# Patient Record
Sex: Female | Born: 1945
Health system: Southern US, Community
[De-identification: ages and names within clinical notes are randomized; demographics above are authoritative.]

## PROBLEM LIST (undated history)

## (undated) DIAGNOSIS — G4733 Obstructive sleep apnea (adult) (pediatric): Secondary | ICD-10-CM

## (undated) DIAGNOSIS — M48061 Spinal stenosis, lumbar region without neurogenic claudication: Secondary | ICD-10-CM

## (undated) DIAGNOSIS — I10 Essential (primary) hypertension: Secondary | ICD-10-CM

## (undated) DIAGNOSIS — E785 Hyperlipidemia, unspecified: Secondary | ICD-10-CM

## (undated) DIAGNOSIS — M419 Scoliosis, unspecified: Secondary | ICD-10-CM

## (undated) DIAGNOSIS — Q249 Congenital malformation of heart, unspecified: Secondary | ICD-10-CM

## (undated) DIAGNOSIS — K449 Diaphragmatic hernia without obstruction or gangrene: Secondary | ICD-10-CM

## (undated) DIAGNOSIS — E119 Type 2 diabetes mellitus without complications: Secondary | ICD-10-CM

## (undated) DIAGNOSIS — K219 Gastro-esophageal reflux disease without esophagitis: Secondary | ICD-10-CM

## (undated) DIAGNOSIS — I509 Heart failure, unspecified: Secondary | ICD-10-CM

## (undated) DIAGNOSIS — Q8789 Other specified congenital malformation syndromes, not elsewhere classified: Secondary | ICD-10-CM

## (undated) DIAGNOSIS — M4310 Spondylolisthesis, site unspecified: Secondary | ICD-10-CM

## (undated) DIAGNOSIS — J189 Pneumonia, unspecified organism: Secondary | ICD-10-CM

## (undated) HISTORY — DX: Congenital malformation of heart, unspecified: Q24.9

## (undated) HISTORY — DX: Hyperlipidemia, unspecified: E78.5

## (undated) HISTORY — DX: Obstructive sleep apnea (adult) (pediatric): G47.33

## (undated) HISTORY — DX: Type 2 diabetes mellitus without complications: E11.9

## (undated) HISTORY — DX: Scoliosis, unspecified: M41.9

## (undated) HISTORY — DX: Heart failure, unspecified: I50.9

## (undated) HISTORY — DX: Other specified congenital malformation syndromes, not elsewhere classified: Q87.89

## (undated) HISTORY — DX: Gastro-esophageal reflux disease without esophagitis: K21.9

## (undated) HISTORY — DX: Pneumonia, unspecified organism: J18.9

## (undated) HISTORY — DX: Essential (primary) hypertension: I10

## (undated) HISTORY — DX: Spinal stenosis, lumbar region without neurogenic claudication: M48.061

## (undated) HISTORY — DX: Diaphragmatic hernia without obstruction or gangrene: K44.9

## (undated) HISTORY — DX: Spondylolisthesis, site unspecified: M43.10

## (undated) HISTORY — PX: TOTAL HIP ARTHROPLASTY: SHX124

## (undated) HISTORY — PX: CARDIAC SURGERY: SHX584

---

## 2004-05-05 DIAGNOSIS — J189 Pneumonia, unspecified organism: Secondary | ICD-10-CM

## 2004-05-05 HISTORY — DX: Pneumonia, unspecified organism: J18.9

## 2005-02-19 ENCOUNTER — Ambulatory Visit: Payer: Self-pay | Admitting: Family Medicine

## 2005-02-25 ENCOUNTER — Ambulatory Visit: Payer: Self-pay | Admitting: Family Medicine

## 2005-03-31 ENCOUNTER — Ambulatory Visit: Payer: Self-pay | Admitting: Family Medicine

## 2005-05-08 ENCOUNTER — Ambulatory Visit: Payer: Self-pay | Admitting: Family Medicine

## 2005-06-09 ENCOUNTER — Ambulatory Visit: Payer: Self-pay | Admitting: Family Medicine

## 2005-07-28 ENCOUNTER — Ambulatory Visit: Payer: Self-pay | Admitting: Family Medicine

## 2005-12-10 ENCOUNTER — Ambulatory Visit: Payer: Self-pay | Admitting: Family Medicine

## 2006-01-01 ENCOUNTER — Ambulatory Visit: Payer: Self-pay | Admitting: Family Medicine

## 2006-03-19 ENCOUNTER — Ambulatory Visit: Payer: Self-pay | Admitting: Family Medicine

## 2006-03-23 ENCOUNTER — Telehealth (INDEPENDENT_AMBULATORY_CARE_PROVIDER_SITE_OTHER): Payer: Self-pay | Admitting: *Deleted

## 2006-04-02 ENCOUNTER — Ambulatory Visit: Payer: Self-pay | Admitting: Family Medicine

## 2006-04-02 DIAGNOSIS — I10 Essential (primary) hypertension: Secondary | ICD-10-CM | POA: Insufficient documentation

## 2006-04-10 DIAGNOSIS — E785 Hyperlipidemia, unspecified: Secondary | ICD-10-CM | POA: Insufficient documentation

## 2006-06-01 ENCOUNTER — Ambulatory Visit: Payer: Self-pay | Admitting: Family Medicine

## 2006-08-06 ENCOUNTER — Encounter: Payer: Self-pay | Admitting: Family Medicine

## 2006-08-06 LAB — CONVERTED CEMR LAB
AST: 17 units/L (ref 0–37)
BUN: 9 mg/dL (ref 6–23)
Calcium: 9.5 mg/dL (ref 8.4–10.5)
Chloride: 106 meq/L (ref 96–112)
Creatinine, Ser: 0.66 mg/dL (ref 0.40–1.20)
HDL: 47 mg/dL (ref >39)
Total Bilirubin: 0.6 mg/dL (ref 0.3–1.2)
Total CHOL/HDL Ratio: 5
VLDL: 21 mg/dL (ref 0–40)

## 2006-08-12 ENCOUNTER — Encounter: Payer: Self-pay | Admitting: Family Medicine

## 2006-08-14 ENCOUNTER — Encounter: Payer: Self-pay | Admitting: Family Medicine

## 2006-08-14 ENCOUNTER — Ambulatory Visit: Payer: Self-pay | Admitting: Family Medicine

## 2006-08-14 ENCOUNTER — Other Ambulatory Visit: Admission: RE | Admit: 2006-08-14 | Discharge: 2006-08-14 | Payer: Self-pay | Admitting: Family Medicine

## 2006-08-14 DIAGNOSIS — I38 Endocarditis, valve unspecified: Secondary | ICD-10-CM | POA: Insufficient documentation

## 2006-08-17 ENCOUNTER — Encounter: Payer: Self-pay | Admitting: Family Medicine

## 2006-08-18 ENCOUNTER — Encounter: Payer: Self-pay | Admitting: Family Medicine

## 2006-08-21 ENCOUNTER — Telehealth (INDEPENDENT_AMBULATORY_CARE_PROVIDER_SITE_OTHER): Payer: Self-pay | Admitting: *Deleted

## 2006-09-02 ENCOUNTER — Encounter: Payer: Self-pay | Admitting: Family Medicine

## 2006-09-15 ENCOUNTER — Encounter: Payer: Self-pay | Admitting: Family Medicine

## 2006-12-17 ENCOUNTER — Encounter: Admission: RE | Admit: 2006-12-17 | Discharge: 2006-12-17 | Payer: Self-pay | Admitting: Family Medicine

## 2006-12-17 ENCOUNTER — Ambulatory Visit: Payer: Self-pay | Admitting: Family Medicine

## 2006-12-17 DIAGNOSIS — M419 Scoliosis, unspecified: Secondary | ICD-10-CM | POA: Insufficient documentation

## 2006-12-17 DIAGNOSIS — R7301 Impaired fasting glucose: Secondary | ICD-10-CM | POA: Insufficient documentation

## 2006-12-17 DIAGNOSIS — M4180 Other forms of scoliosis, site unspecified: Secondary | ICD-10-CM | POA: Insufficient documentation

## 2006-12-17 DIAGNOSIS — I272 Pulmonary hypertension, unspecified: Secondary | ICD-10-CM | POA: Insufficient documentation

## 2006-12-18 ENCOUNTER — Encounter: Payer: Self-pay | Admitting: Family Medicine

## 2006-12-18 LAB — CONVERTED CEMR LAB
BUN: 11 mg/dL (ref 6–23)
Calcium: 9.1 mg/dL (ref 8.4–10.5)
Direct LDL: 148 mg/dL — ABNORMAL HIGH
Glucose, Bld: 73 mg/dL (ref 70–99)
Sodium: 142 meq/L (ref 135–145)

## 2007-01-19 ENCOUNTER — Encounter: Payer: Self-pay | Admitting: Family Medicine

## 2007-04-19 ENCOUNTER — Ambulatory Visit: Payer: Self-pay | Admitting: Family Medicine

## 2007-04-19 DIAGNOSIS — Z96642 Presence of left artificial hip joint: Secondary | ICD-10-CM | POA: Insufficient documentation

## 2007-04-19 DIAGNOSIS — L659 Nonscarring hair loss, unspecified: Secondary | ICD-10-CM | POA: Insufficient documentation

## 2007-04-20 LAB — CONVERTED CEMR LAB
Ferritin: 55 ng/mL (ref 10–291)
Hemoglobin: 13.2 g/dL (ref 12.0–15.0)
Platelets: 348 10*3/uL (ref 150–400)
RDW: 13.2 % (ref 11.5–15.5)

## 2007-07-19 ENCOUNTER — Ambulatory Visit: Payer: Self-pay | Admitting: Family Medicine

## 2007-07-19 DIAGNOSIS — K219 Gastro-esophageal reflux disease without esophagitis: Secondary | ICD-10-CM | POA: Insufficient documentation

## 2007-07-22 ENCOUNTER — Encounter: Payer: Self-pay | Admitting: Family Medicine

## 2007-07-26 ENCOUNTER — Telehealth: Payer: Self-pay | Admitting: Family Medicine

## 2007-07-30 ENCOUNTER — Encounter: Payer: Self-pay | Admitting: Family Medicine

## 2007-07-30 LAB — CONVERTED CEMR LAB
ALT: 19 units/L (ref 0–35)
Alkaline Phosphatase: 58 units/L (ref 39–117)
LDL Cholesterol: 94 mg/dL (ref 0–99)
Sodium: 143 meq/L (ref 135–145)
Total Bilirubin: 0.5 mg/dL (ref 0.3–1.2)
Total Protein: 7.2 g/dL (ref 6.0–8.3)
VLDL: 17 mg/dL (ref 0–40)

## 2007-08-02 ENCOUNTER — Encounter: Payer: Self-pay | Admitting: Family Medicine

## 2007-08-03 ENCOUNTER — Encounter: Payer: Self-pay | Admitting: Family Medicine

## 2007-08-04 HISTORY — PX: ESOPHAGEAL DILATION: SHX303

## 2007-08-18 ENCOUNTER — Encounter: Payer: Self-pay | Admitting: Family Medicine

## 2007-08-18 DIAGNOSIS — K222 Esophageal obstruction: Secondary | ICD-10-CM | POA: Insufficient documentation

## 2007-12-29 ENCOUNTER — Ambulatory Visit: Payer: Self-pay | Admitting: Family Medicine

## 2007-12-29 ENCOUNTER — Encounter: Admission: RE | Admit: 2007-12-29 | Discharge: 2007-12-29 | Payer: Self-pay | Admitting: Family Medicine

## 2007-12-29 DIAGNOSIS — R609 Edema, unspecified: Secondary | ICD-10-CM | POA: Insufficient documentation

## 2007-12-29 DIAGNOSIS — M5416 Radiculopathy, lumbar region: Secondary | ICD-10-CM | POA: Insufficient documentation

## 2008-01-14 ENCOUNTER — Encounter: Payer: Self-pay | Admitting: Family Medicine

## 2008-01-18 ENCOUNTER — Telehealth: Payer: Self-pay | Admitting: Family Medicine

## 2008-02-11 ENCOUNTER — Encounter: Payer: Self-pay | Admitting: Family Medicine

## 2008-02-15 ENCOUNTER — Telehealth: Payer: Self-pay | Admitting: Family Medicine

## 2008-02-22 ENCOUNTER — Encounter: Payer: Self-pay | Admitting: Family Medicine

## 2008-03-02 ENCOUNTER — Ambulatory Visit: Payer: Self-pay | Admitting: Family Medicine

## 2008-03-02 DIAGNOSIS — Z96652 Presence of left artificial knee joint: Secondary | ICD-10-CM | POA: Insufficient documentation

## 2008-03-02 DIAGNOSIS — I739 Peripheral vascular disease, unspecified: Secondary | ICD-10-CM | POA: Insufficient documentation

## 2008-04-03 ENCOUNTER — Telehealth (INDEPENDENT_AMBULATORY_CARE_PROVIDER_SITE_OTHER): Payer: Self-pay | Admitting: *Deleted

## 2008-04-03 ENCOUNTER — Telehealth: Payer: Self-pay | Admitting: Family Medicine

## 2008-11-20 ENCOUNTER — Telehealth: Payer: Self-pay | Admitting: Family Medicine

## 2008-11-23 ENCOUNTER — Ambulatory Visit: Payer: Self-pay | Admitting: Family Medicine

## 2008-11-23 LAB — CONVERTED CEMR LAB
Bilirubin Urine: NEGATIVE
Glucose, Urine, Semiquant: NEGATIVE
Urobilinogen, UA: 1
pH: 8.5

## 2008-11-24 ENCOUNTER — Encounter: Payer: Self-pay | Admitting: Family Medicine

## 2008-11-24 LAB — CONVERTED CEMR LAB
Alkaline Phosphatase: 74 units/L (ref 39–117)
Glucose, Bld: 100 mg/dL — ABNORMAL HIGH (ref 70–99)
Pro B Natriuretic peptide (BNP): 34.1 pg/mL (ref 0.0–100.0)
Sodium: 144 meq/L (ref 135–145)
Total Bilirubin: 0.5 mg/dL (ref 0.3–1.2)
Total Protein: 6.9 g/dL (ref 6.0–8.3)

## 2009-02-13 ENCOUNTER — Ambulatory Visit: Payer: Self-pay | Admitting: Family Medicine

## 2009-03-14 ENCOUNTER — Encounter (INDEPENDENT_AMBULATORY_CARE_PROVIDER_SITE_OTHER): Payer: Self-pay | Admitting: *Deleted

## 2009-05-08 ENCOUNTER — Ambulatory Visit: Payer: Self-pay | Admitting: Family Medicine

## 2009-05-08 DIAGNOSIS — M479 Spondylosis, unspecified: Secondary | ICD-10-CM | POA: Insufficient documentation

## 2010-03-15 LAB — HM MAMMOGRAPHY

## 2010-04-01 ENCOUNTER — Encounter (INDEPENDENT_AMBULATORY_CARE_PROVIDER_SITE_OTHER): Payer: Self-pay | Admitting: *Deleted

## 2010-05-14 ENCOUNTER — Encounter: Payer: Self-pay | Admitting: Family Medicine

## 2010-06-04 NOTE — Assessment & Plan Note (Signed)
Summary: neck pain   Vital Signs:  Patient profile:   65 year old female Height:      64 inches Weight:      222 pounds BMI:     38.24 O2 Sat:      96 % on Room air Temp:     98.6 degrees F oral Pulse rate:   107 / minute BP sitting:   155 / 77  (left arm) Cuff size:   large  Vitals Entered By: Payton Spark CMA (May 08, 2009 3:14 PM)  O2 Flow:  Room air CC: Neck spasms x 3 days.    Primary Care Provider:  Seymour Bars DO  CC:  Neck spasms x 3 days. Marland Kitchen  History of Present Illness: 65 yo AAF presents for muscle spasm around her neck that started 3 days ago.  She tried some Tylenol and heat which has not helped.  Denies recent trauma.  She is gettting  little HA from this.  She has hx of neck arthritis.  She is having limited ROM.  She is having trouble getting comfortable at night.  Already on Celebrex but this is not helping.  Current Medications (verified): 1)  Pravastatin Sodium 80 Mg  Tabs (Pravastatin Sodium) .Marland Kitchen.. 1 Tab By Mouth Qhs 2)  Bayer Aspirin 325 Mg Tabs (Aspirin) .Marland Kitchen.. 1 Tab By Mouth Daily 3)  Oscal 500/200 D-3 500-200 Mg-Unit Tabs (Calcium-Vitamin D) .Marland Kitchen.. 1 Tab By Mouth Two Times A Day With Food 4)  Multivitamins  Caps (Multiple Vitamin) .Marland Kitchen.. 1 Tab By Mouth Daily 5)  Triamterene-Hctz 37.5-25 Mg Caps (Triamterene-Hctz) .Marland Kitchen.. 1 Tab By Mouth Daily 6)  Celebrex 200 Mg Caps (Celecoxib) .Marland Kitchen.. 1 Capsule By Mouth Daily  Pt Is Medically Prescribed Aspirin 7)  Furosemide 20 Mg Tabs (Furosemide) .Marland Kitchen.. 1 Tab By Mouth Daily As Needed For Leg Swelling 8)  Proair Hfa 108 (90 Base) Mcg/act Aers (Albuterol Sulfate) .... 2 Puffs Q 6 Hrs X 1 Wk  Allergies (verified): 1)  Altace (Ramipril)  Past History:  Past Medical History: Reviewed history from 03/02/2008 and no changes required. congenital heart dz- lg ASD (cards: Dr Julious Oka)  L4-L5 foraminal stenosis mod-severe MVR  servere scoliosis  Severe Pulm HTN-- on nighttime O2  T spine 80% to Right  L spine 55% to Left  pneumonia 2006 dyslipidemia HTN hiatal hernia GERD  Past Surgical History: Reviewed history from 08/18/2007 and no changes required. 2D echo- EF 55-60%, LAE,mod-sev MVR, CXR- mild CHF, patchy bibasilar infiltrates heart surgery- repair of ASD, TEE- EF 60%, MVR- mod dilation of esophageal stricture 4-09, Dr Inge Rise  Social History: Reviewed history from 02/10/2006 and no changes required. Married with 2 adopted children, ages 70 & 1. 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  Nonsmoker.  Denies ETOH.  Does not exercise.  Referred for weight mgmt classes.  Review of Systems      See HPI  Physical Exam  General:  alert, well-developed, well-nourished, and well-hydrated.  obese Head:  normocephalic and atraumatic.   Msk:  tight trapezious muscles bilateral neck. limited global c spine ROM.  grip + 5/5 with full UE ROM. No edema   Impression & Recommendations:  Problem # 1:  ARTHRITIS, CERVICAL SPINE (ICD-721.90) With secondary trapezious spasm. Not improving with Celebrex and heat. Add Valium as a muscle relaxer 1-2 x a day (cautioned about sedation). Use 1/2 of a Percocet at night to help with pain/ sleep for 10 days. Gentle stretches shown to pt.  Use heat to help soften trap muscles. If not improved in 1 wk, will get C spine films and set her up for PT. No current signs of radiculopathy.  Complete Medication List: 1)  Pravastatin Sodium 80 Mg Tabs (Pravastatin sodium) .Marland Kitchen.. 1 tab by mouth qhs 2)  Bayer Aspirin 325 Mg Tabs (Aspirin) .Marland Kitchen.. 1 tab by mouth daily 3)  Oscal 500/200 D-3 500-200 Mg-unit Tabs (Calcium-vitamin d) .Marland Kitchen.. 1 tab by mouth two times a day with food 4)  Multivitamins Caps (Multiple vitamin) .Marland Kitchen.. 1 tab by mouth daily 5)  Triamterene-hctz 37.5-25 Mg Caps (Triamterene-hctz) .Marland Kitchen.. 1 tab by mouth daily 6)  Celebrex 200 Mg Caps (Celecoxib) .Marland Kitchen.. 1 capsule by mouth daily  pt is medically prescribed aspirin 7)  Furosemide 20 Mg Tabs (Furosemide) .Marland Kitchen.. 1  tab by mouth daily as needed for leg swelling 8)  Proair Hfa 108 (90 Base) Mcg/act Aers (Albuterol sulfate) .... 2 puffs q 6 hrs x 1 wk 9)  Valium 5 Mg Tabs (Diazepam) .Marland Kitchen.. 1 tab by mouth two times a day as needed neck spasm 10)  Oxycodone-acetaminophen 5-325 Mg Tabs (Oxycodone-acetaminophen) .... 1/2 tab by mouth at bedtime as needed neck pain  Patient Instructions: 1)  Treat neck arthtrits/ muscle spasm with: 2)  Heat, gentle stretches. 3)  Stay on Celebrex once daily (in the AM). 4)  Take Valium at night (in the AM if you don't have to drive/ work). 5)  Use 1/2 oxycodone at night for neck pain. 6)  If not improved in 1 wk, please call. Prescriptions: OXYCODONE-ACETAMINOPHEN 5-325 MG TABS (OXYCODONE-ACETAMINOPHEN) 1/2 tab by mouth at bedtime as needed neck pain  #10 x 0   Entered and Authorized by:   Seymour Bars DO   Signed by:   Seymour Bars DO on 05/08/2009   Method used:   Printed then faxed to ...       [facility]* (retail)       [address]       [place], [place]  [address]       Ph: [phone]       Fax: [phone]   RxID:   [id number] VALIUM 5 MG TABS (DIAZEPAM) 1 tab by mouth two times a day as needed neck spasm  #14 x 0   Entered and Authorized by:   Seymour Bars DO   Signed by:   Seymour Bars DO on 05/08/2009   Method used:   Printed then faxed to ...       [facility]* (retail)       [address]       [place], [place]  [address]       Ph: [phone]       Fax: [phone]   RxID:   [id number]

## 2010-06-06 NOTE — Consult Note (Signed)
Summary: [facility]   Imported By: Lanelle Bal 05/24/2010 10:35:19  _____________________________________________________________________  External Attachment:    Type:   Image     Comment:   External Document

## 2010-08-07 IMAGING — CR DG KNEE 1-2V*R*
2 series · 2 of 2 positions shown · non-contrast
Comparison: None

CLINICAL DATA: Knee pain without injury.

RIGHT KNEE - 1-2 VIEW

[view not recorded (1 of 2)]
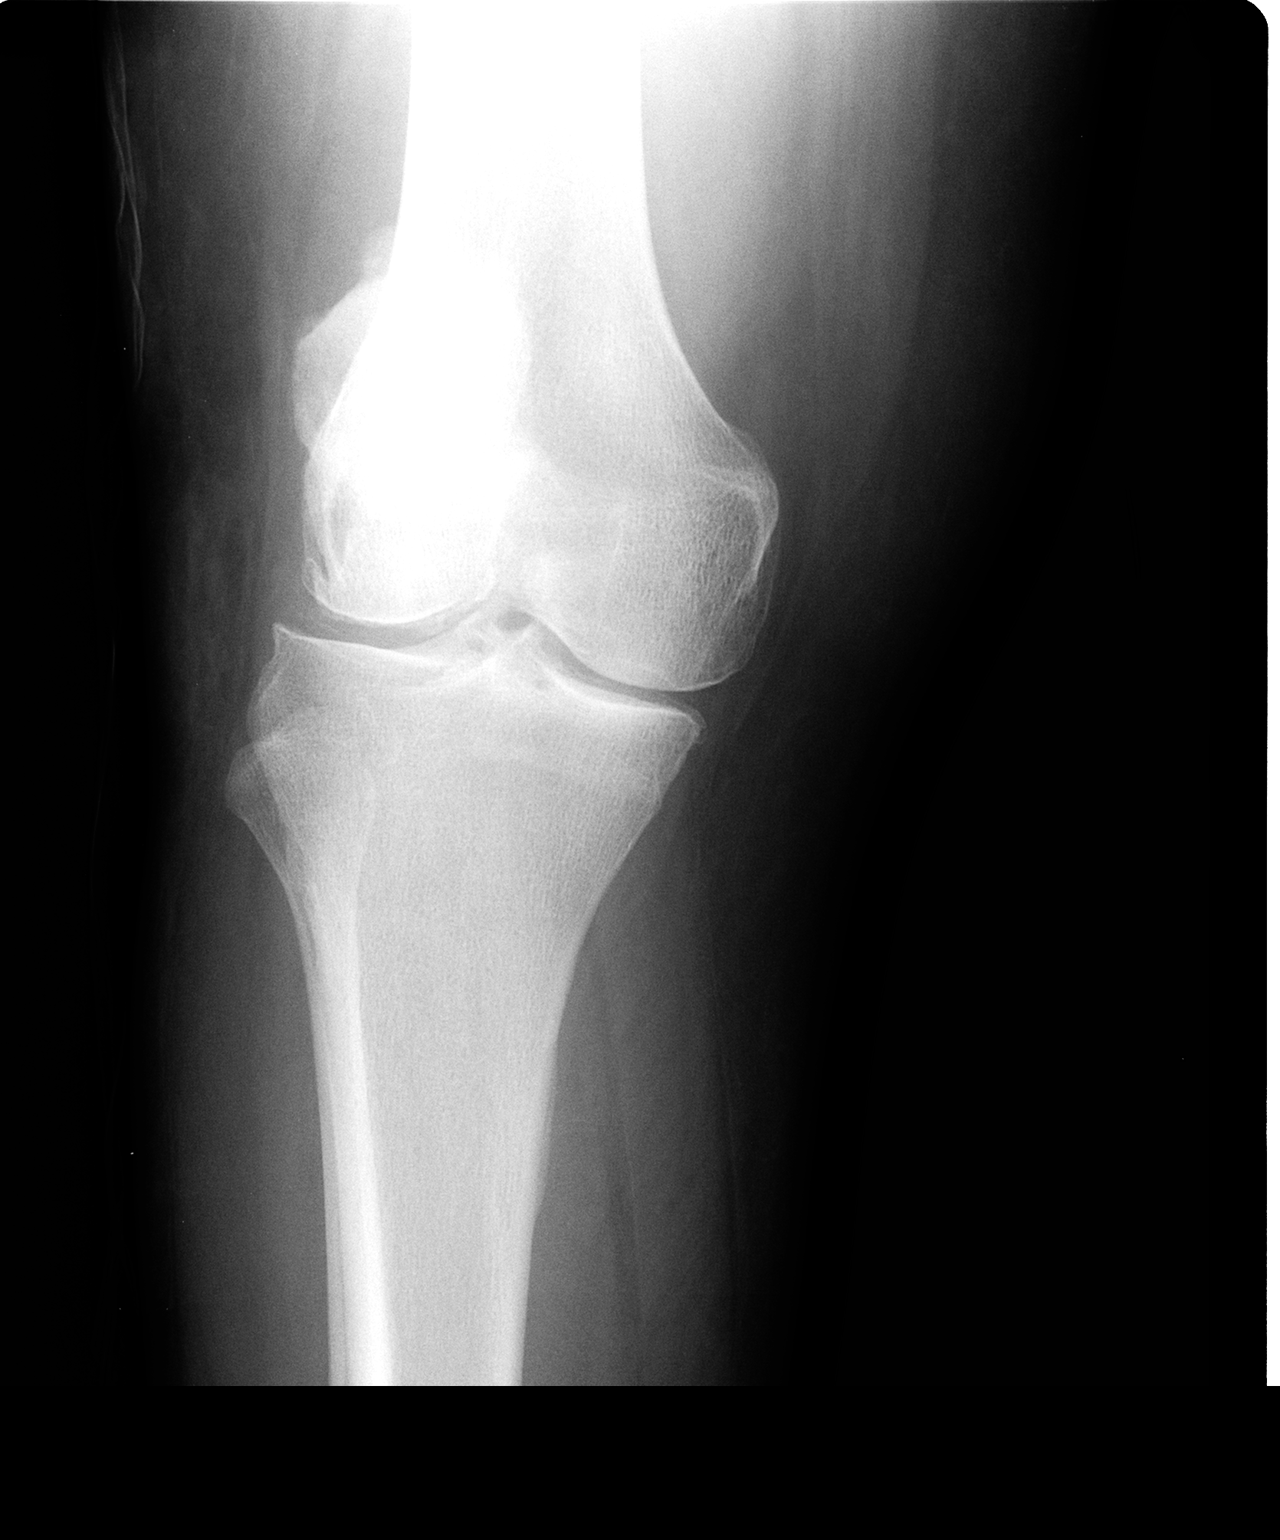

[view not recorded (2 of 2)]
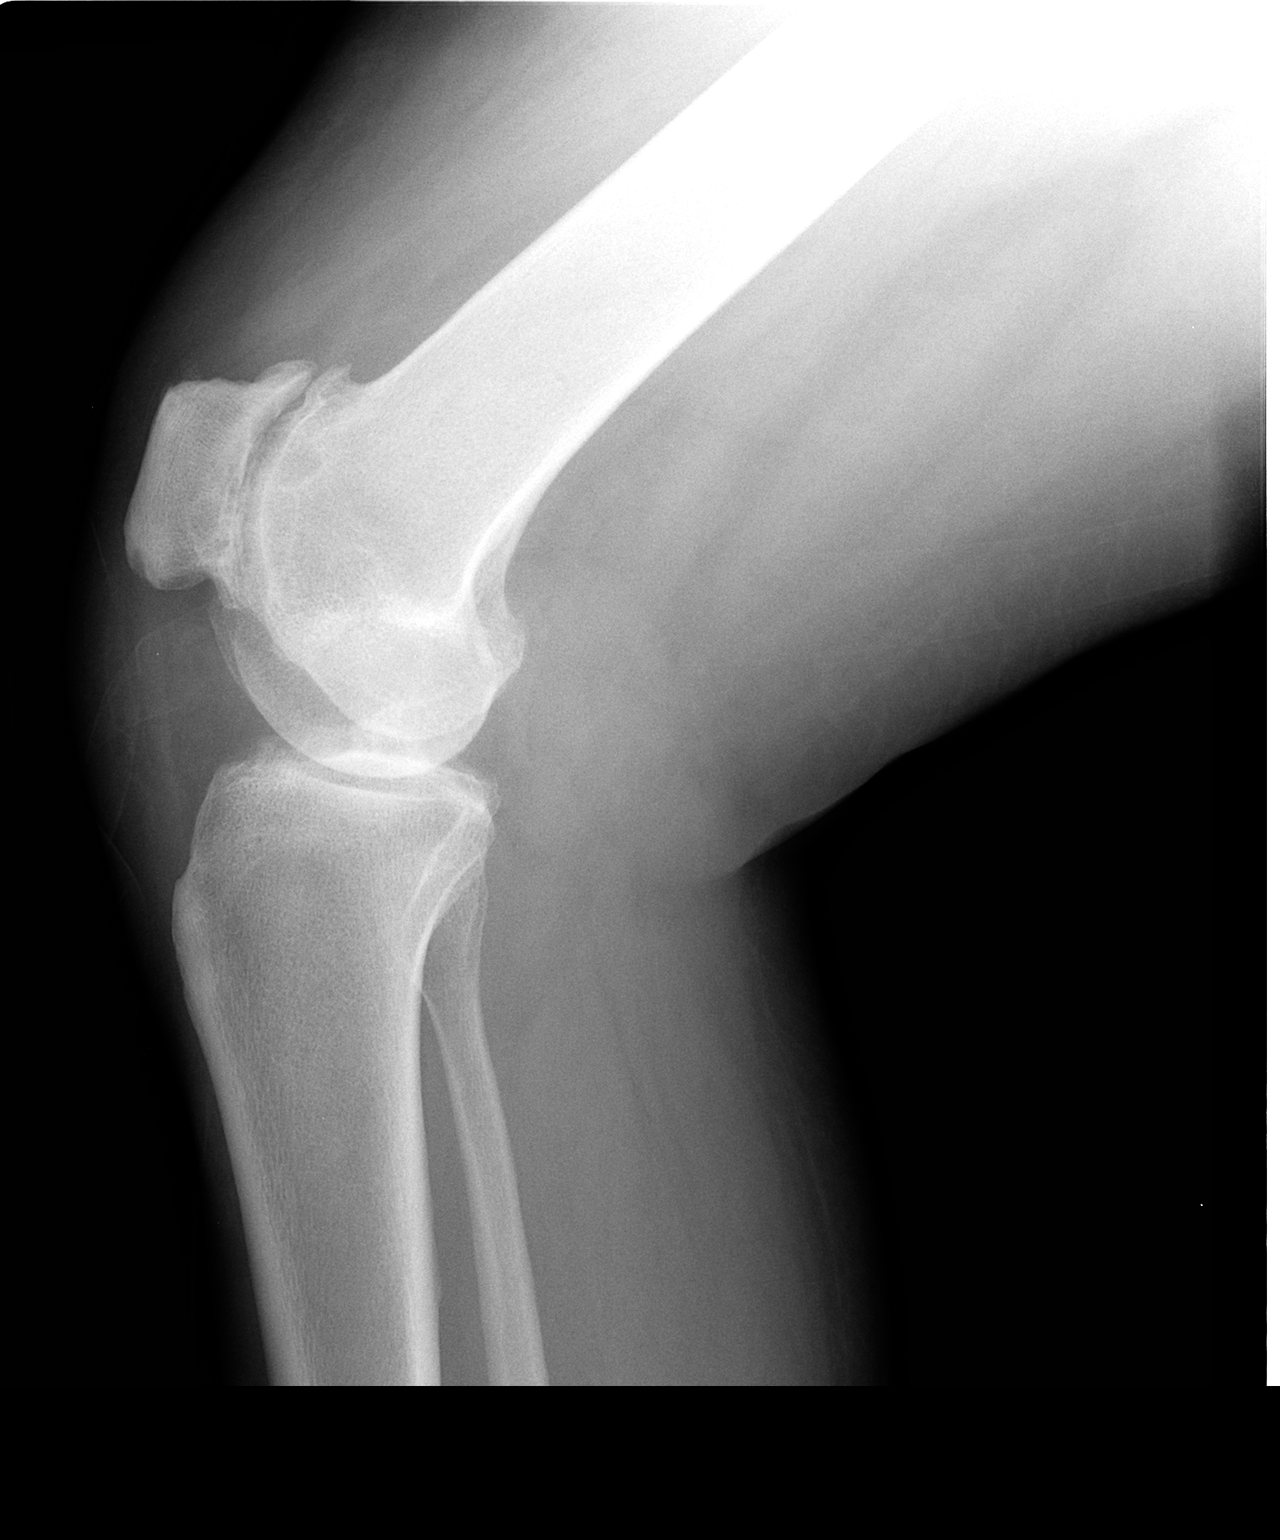

[2 of 2 positions shown; findings below may reference images not displayed]

FINDINGS: Mild to moderate lateral and mild medial compartment
joint space narrowing and osteophyte formation.  Chondrocalcinosis
within the lateral meniscus.  Severe patellofemoral joint
osteoarthritis.  Possible tiny joint effusion.  No acute fracture
dislocation.
IMPRESSION: 1.  Chondrocalcinosis consistent with calcium pyrophosphate
deposition disease.
2.  Concurrent osteoarthritis.
3.  possible small joint effusion.

## 2010-09-09 ENCOUNTER — Other Ambulatory Visit: Payer: Self-pay | Admitting: *Deleted

## 2010-09-09 MED ORDER — FUROSEMIDE 20 MG PO TABS: 20.0000 mg | ORAL_TABLET | Freq: Every day | ORAL | Status: DC

## 2010-10-14 ENCOUNTER — Other Ambulatory Visit: Payer: Self-pay | Admitting: *Deleted

## 2010-10-14 MED ORDER — LORATADINE-PSEUDOEPHEDRINE ER 10-240 MG PO TB24: 1.0000 | ORAL_TABLET | Freq: Every day | ORAL | Status: DC

## 2010-10-27 ENCOUNTER — Encounter: Payer: Self-pay | Admitting: Family Medicine

## 2010-10-29 ENCOUNTER — Encounter: Payer: Self-pay | Admitting: Family Medicine

## 2010-10-29 ENCOUNTER — Ambulatory Visit (INDEPENDENT_AMBULATORY_CARE_PROVIDER_SITE_OTHER): Payer: BC Managed Care – PPO | Admitting: Family Medicine

## 2010-10-29 ENCOUNTER — Other Ambulatory Visit (HOSPITAL_COMMUNITY)
Admission: RE | Admit: 2010-10-29 | Discharge: 2010-10-29 | Disposition: A | Discharge status: Home / Self Care | Payer: BC Managed Care – PPO | Source: Ambulatory Visit | Attending: Family Medicine | Admitting: Family Medicine

## 2010-10-29 ENCOUNTER — Other Ambulatory Visit: Payer: Self-pay | Admitting: Family Medicine

## 2010-10-29 VITALS — BP 132/72 | HR 90 | Ht 64.0 in | Wt 218.0 lb

## 2010-10-29 DIAGNOSIS — Z1211 Encounter for screening for malignant neoplasm of colon: Secondary | ICD-10-CM

## 2010-10-29 DIAGNOSIS — Z23 Encounter for immunization: Secondary | ICD-10-CM

## 2010-10-29 DIAGNOSIS — E785 Hyperlipidemia, unspecified: Secondary | ICD-10-CM

## 2010-10-29 DIAGNOSIS — Z01419 Encounter for gynecological examination (general) (routine) without abnormal findings: Secondary | ICD-10-CM

## 2010-10-29 DIAGNOSIS — I10 Essential (primary) hypertension: Secondary | ICD-10-CM

## 2010-10-29 DIAGNOSIS — Z780 Asymptomatic menopausal state: Secondary | ICD-10-CM

## 2010-10-29 MED ORDER — ALBUTEROL SULFATE HFA 108 (90 BASE) MCG/ACT IN AERS: 2.0000 | INHALATION_SPRAY | Freq: Four times a day (QID) | RESPIRATORY_TRACT | Status: DC | PRN

## 2010-10-29 MED ORDER — ESTRADIOL 0.1 MG/GM VA CREA: 1.0000 g | TOPICAL_CREAM | Freq: Every day | VAGINAL | Status: AC

## 2010-10-29 MED ORDER — TETANUS-DIPHTH-ACELL PERTUSSIS 5-2.5-18.5 LF-MCG/0.5 IM SUSP: 0.5000 mL | Freq: Once | INTRAMUSCULAR | Status: DC

## 2010-10-29 MED ORDER — TRIAMTERENE-HCTZ 37.5-25 MG PO TABS: 1.0000 | ORAL_TABLET | Freq: Every day | ORAL | Status: DC

## 2010-10-29 NOTE — Patient Instructions (Signed)
BP medicine RFd.  Update fasting labs one morning downstairs. Will call you w/ results.  Mammogram due in Nov.  Update bone density downstairs.  Call [phone] and ask for [place] to schedule.  Will get your colonoscopy updated.  Try vaginal estrogen cream 1/2 to 1 gram for the first 2 weeks of each month for vaginal dryness.  Return for f/u visit in 4 mos.

## 2010-10-29 NOTE — Progress Notes (Signed)
  Subjective:    Patient ID: Shelby Thomas, female    DOB: Jan 29, 1946, 65 y.o.   MRN: [id number]  HPI 65 yo AAF presents for CPE with pap.  She is postmenopausal and married.  Denies hx of abnormal pap smear.  She has vaginal irritation and dryness.  She has come off her BP and cholesterol medicine and has a long hx of OSA and valvular heart dz, formerly seen by Dr Julious Oka.  Denies CP but has chronic SOB and gets little exercise.  She has failed to lose wt though her leg swelling has improved.  She is due for her screening colonoscopy, labs and DEXA.  Mammogram due in November.    BP 132/72  Pulse 90  Ht 5\' 4"  (1.626 m)  Wt 218 lb (98.884 kg)  BMI 37.42 kg/m2  SpO2 94% Patient Active Problem List  Diagnoses  . HYPERLIPIDEMIA  . HYPERTENSION, BENIGN ESSENTIAL  . PULMONARY HYPERTENSION, SEVERE  . VALVULAR HEART DISEASE  . PVD  . ESOPHAGEAL STRICTURE  . GERD  . HAIR LOSS  . ARTHRITIS, LEFT HIP  . ARTHRITIS, KNEES, BILATERAL  . ARTHRITIS, CERVICAL SPINE  . BACK PAIN  . SCOLIOSIS NEC  . ANKLE EDEMA  . IMPAIRED FASTING GLUCOSE       Review of Systems Gen: no fevers, chills, hot flashes, night sweats, change in weight GI: no N/V/C/D GU: no dysuria, incontinence or sexual dysfunction CV: no chest pain, DOE, palpitations s or edema Pulm:  Denies CP, SOB or chronic cough      Objective:   Physical Exam Gen: alert, well groomed in NAD, obese Neck: no thyromegaly or cervical lymphadenopathy CV: RRR w/o murmur, no audible carotid bruits or abdominal aortic bruits Ext: no edema, clubbing or cyanosis Lungs: CTA bilat w/o W/R/R; nonlabored HEENT:  [place]/AT; PERRLA; oropharynx pink and moist with good dentition Abd: soft, NT, ND, NABS, No HSM, no audible AA bruits Skin: warm and dry; no rash, pallor or jaundice Breast:  No palpable masses, asymetry or axillary lymphadenopathy, no nipple discharge GU:  Atrophic vaginitis with difficult to visualize os due to atrophy and body  habitus. Slight friability.  No discharge.  Unable to palpate ovaries or adnexa due to body habitus.  Thin prep pap obtained. Psych: does not appear anxious or depressed; answers questions appropriately       Assessment & Plan:  Assesment:  1. CPE- Keeping healthy checklist for women reviewed today.  BP at goal.  BMI 37  in the class II obesity range.     Labs ordered Colonoscopy ordered Mammogram UTD( Nov 2011), DEXA ordered. Encouraged healthy diet, regular exercise, MVI daily. Plan to update a stress echo in the next yr and update her sleep study. Tdap updated today. F/u thin prep and treat atrophic vaginitis with topical estrogen 2 wks/ month.

## 2010-10-30 LAB — LIPID PANEL
LDL Cholesterol: 163 mg/dL — ABNORMAL HIGH (ref 0–99)
VLDL: 21 mg/dL (ref 0–40)

## 2010-10-31 ENCOUNTER — Telehealth: Payer: Self-pay | Admitting: Family Medicine

## 2010-10-31 LAB — COMPLETE METABOLIC PANEL WITH GFR
ALT: 15 U/L (ref 0–35)
AST: 19 U/L (ref 0–37)
Albumin: 3.9 g/dL (ref 3.5–5.2)
CO2: 35 mEq/L — ABNORMAL HIGH (ref 19–32)
Calcium: 9.1 mg/dL (ref 8.4–10.5)
Chloride: 101 mEq/L (ref 96–112)
GFR, Est African American: >60 mL/min (ref >60)
Potassium: 4.4 mEq/L (ref 3.5–5.3)
Sodium: 141 mEq/L (ref 135–145)
Total Protein: 7.2 g/dL (ref 6.0–8.3)

## 2010-10-31 LAB — CBC WITH DIFFERENTIAL/PLATELET
Eosinophils Relative: 4 % (ref 0–5)
Lymphocytes Relative: 29 % (ref 12–46)
Lymphs Abs: 2.4 10*3/uL (ref 0.7–4.0)
MCV: 101.2 fL — ABNORMAL HIGH (ref 78.0–100.0)
Neutro Abs: 5.1 10*3/uL (ref 1.7–7.7)
Neutrophils Relative %: 62 % (ref 43–77)
Platelets: 359 10*3/uL (ref 150–400)
RBC: 4.19 MIL/uL (ref 3.87–5.11)
WBC: 8.3 10*3/uL (ref 4.0–10.5)

## 2010-10-31 NOTE — Telephone Encounter (Signed)
Pls see if the lab can add on an A1C dx: hyperglycemia and a B12 and Folic Acid level dx: macrocytosis.

## 2010-11-01 ENCOUNTER — Telehealth: Payer: Self-pay | Admitting: Family Medicine

## 2010-11-01 LAB — VITAMIN B12: Vitamin B-12: 721 pg/mL (ref 211–911)

## 2010-11-01 LAB — HEMOGLOBIN A1C
Hgb A1c MFr Bld: 6.8 % — ABNORMAL HIGH (ref <5.7)
Mean Plasma Glucose: 148 mg/dL — ABNORMAL HIGH (ref <117)

## 2010-11-01 NOTE — Telephone Encounter (Signed)
Pt aware.

## 2010-11-01 NOTE — Telephone Encounter (Signed)
Labs added.

## 2010-11-01 NOTE — Telephone Encounter (Signed)
Pls let pt know that her pap smear came back normal.  Repeat in 2 yrs. 

## 2010-11-03 ENCOUNTER — Telehealth: Payer: Self-pay | Admitting: Family Medicine

## 2010-11-03 NOTE — Telephone Encounter (Signed)
Pls let pt know that her B12 and Folic Acid came back normal.  Her A1C was 6.8 which is in the DIABETIC RANGE.    I'd like her to set up OV to discuss treatment in the next wk.

## 2010-11-04 NOTE — Telephone Encounter (Signed)
LMOM informing Pt of the above 

## 2010-11-05 ENCOUNTER — Ambulatory Visit
Admission: RE | Admit: 2010-11-05 | Discharge: 2010-11-05 | Disposition: A | Discharge status: Home / Self Care | Payer: BC Managed Care – PPO | Source: Ambulatory Visit | Attending: Family Medicine | Admitting: Family Medicine

## 2010-11-05 ENCOUNTER — Other Ambulatory Visit: Payer: BC Managed Care – PPO

## 2010-11-05 DIAGNOSIS — Z780 Asymptomatic menopausal state: Secondary | ICD-10-CM

## 2010-11-08 ENCOUNTER — Telehealth: Payer: Self-pay | Admitting: Family Medicine

## 2010-11-08 ENCOUNTER — Ambulatory Visit (INDEPENDENT_AMBULATORY_CARE_PROVIDER_SITE_OTHER): Payer: BC Managed Care – PPO | Admitting: Family Medicine

## 2010-11-08 ENCOUNTER — Encounter: Payer: Self-pay | Admitting: Family Medicine

## 2010-11-08 VITALS — BP 129/68 | HR 89 | Wt 215.0 lb

## 2010-11-08 DIAGNOSIS — E669 Obesity, unspecified: Secondary | ICD-10-CM

## 2010-11-08 DIAGNOSIS — E1169 Type 2 diabetes mellitus with other specified complication: Secondary | ICD-10-CM | POA: Insufficient documentation

## 2010-11-08 DIAGNOSIS — E119 Type 2 diabetes mellitus without complications: Secondary | ICD-10-CM | POA: Insufficient documentation

## 2010-11-08 MED ORDER — AMBULATORY NON FORMULARY MEDICATION: Status: DC

## 2010-11-08 MED ORDER — METFORMIN HCL ER (OSM) 500 MG PO TB24: 500.0000 mg | ORAL_TABLET | Freq: Every day | ORAL | Status: DC

## 2010-11-08 MED ORDER — ACCU-CHEK MULTICLIX LANCETS MISC: Status: AC

## 2010-11-08 NOTE — Telephone Encounter (Signed)
LMOM informing Pt  

## 2010-11-08 NOTE — Patient Instructions (Addendum)
Start Metformin XR 500 mg with breakfast daily.  Will set up for nutrition counseling.  Work on low sugar/ low carb diet, regular physical activity.  Check AM fasting sugars.  Goal is 80-110.  Return for f/u sugar readings in 6 wks.

## 2010-11-08 NOTE — Telephone Encounter (Signed)
Pls let pt know that her bone density came back normal.  Repeat in 3 yrs.

## 2010-11-08 NOTE — Assessment & Plan Note (Signed)
Discussed her lab results confirming T2DM.  Will start Metformin XR 500 mg once daily with breakfast.  Set up for nutritionist referral.  Educated pt on self monitoring sugars, AM fasting and a free meter given with RX for test strips and lancets.  Will work on a diabetic diet and more regular exercise.  Long term plan for wt loss is important given her BMI of 36.  Call if any problems.  PNX is UTD.  Plan for urine micro and monofilament at her f/u visit in 6 wks.

## 2010-11-08 NOTE — Progress Notes (Signed)
  Subjective:    Patient ID: Shelby Thomas, female    DOB: 07-23-1945, 65 y.o.   MRN: [id number]  HPI  65 yo AAF presents for a new diagnosis of diabetes.  We did her fasting labs recently and her fasting glucose was 133 on her CMP.  We added an A1C and it was 6.8 confirming her dx.  She has had IFG and obesity problems in the past.  She admits to a lack of exercise and eating candy and ice cream a lot.  She is willing to monitor sugars at home and see a nutritionist.  Denies blurry vision, polyuria, wt loss or paresthesias.  BP 129/68  Pulse 89  Wt 215 lb (97.523 kg)  SpO2 94%    Review of Systems  Constitutional: Negative for fatigue and unexpected weight change.  Eyes: Negative for visual disturbance.  Genitourinary: Negative for frequency.  Neurological: Negative for numbness.       Objective:   Physical Exam  Constitutional: She appears well-developed and well-nourished. No distress.       obese  Psychiatric: She has a normal mood and affect.          Assessment & Plan:

## 2010-11-11 ENCOUNTER — Telehealth: Payer: Self-pay | Admitting: Family Medicine

## 2010-11-11 NOTE — Telephone Encounter (Signed)
Pt called and said Dr. Cathey Endow wanted her to have a colonoscopy and they scheduled her for one, but when she got to checking she realized she had already had a recent one back in April of 2012.  She called where she was suppose to have the colonoscopy and cancelled the appt.   Plan:  Routed to Dr. Arlice Colt, LPN [facility] Dimes

## 2010-12-18 ENCOUNTER — Encounter: Payer: Self-pay | Admitting: Family Medicine

## 2010-12-23 ENCOUNTER — Ambulatory Visit (INDEPENDENT_AMBULATORY_CARE_PROVIDER_SITE_OTHER): Payer: BC Managed Care – PPO | Admitting: Family Medicine

## 2010-12-23 ENCOUNTER — Encounter: Payer: Self-pay | Admitting: Family Medicine

## 2010-12-23 DIAGNOSIS — I10 Essential (primary) hypertension: Secondary | ICD-10-CM

## 2010-12-23 DIAGNOSIS — E119 Type 2 diabetes mellitus without complications: Secondary | ICD-10-CM

## 2010-12-23 DIAGNOSIS — E1169 Type 2 diabetes mellitus with other specified complication: Secondary | ICD-10-CM

## 2010-12-23 DIAGNOSIS — Z23 Encounter for immunization: Secondary | ICD-10-CM

## 2010-12-23 DIAGNOSIS — E669 Obesity, unspecified: Secondary | ICD-10-CM

## 2010-12-23 LAB — POCT UA - MICROALBUMIN

## 2010-12-23 MED ORDER — ZOSTER VACCINE LIVE 19400 UNT/0.65ML ~~LOC~~ SOLR
0.6500 mL | Freq: Once | SUBCUTANEOUS | Status: AC
  Administered 2010-12-23: 19400 [IU] via SUBCUTANEOUS

## 2010-12-23 MED ORDER — LOSARTAN POTASSIUM 100 MG PO TABS: 100.0000 mg | ORAL_TABLET | Freq: Every day | ORAL | Status: DC

## 2010-12-23 NOTE — Progress Notes (Signed)
  Subjective:    Patient ID: Shelby Thomas, female    DOB: 06-Mar-1946, 65 y.o.   MRN: [id number]  HPI 65 yo AAF presents for f/u newly diagnosed T2DM.  Was started on Metformin XR 500 mg / day on 7-6.  She met with the nutritionist and has been taking her blood sugar AM fasting, running 90s to 120.  Working on diet but no exercise and has gained more weight.  Due for a urine microalbumin and a monofilament exam today.  Would like to get her shingles vaccine today.   BP 132/74  Pulse 87  Ht 5\' 3"  (1.6 m)  Wt 214 lb (97.07 kg)  BMI 37.91 kg/m2  Review of Systems  Constitutional: Negative for fatigue and unexpected weight change.  Cardiovascular: Negative for chest pain.  Gastrointestinal: Negative for diarrhea.       Objective:   Physical Exam  Constitutional: She appears well-developed and well-nourished.       obese  HENT:  Mouth/Throat: Oropharynx is clear and moist.  Eyes: No scleral icterus.  Neck: No thyromegaly present.  Cardiovascular: Normal rate and regular rhythm.   Murmur heard. Pulmonary/Chest: Effort normal and breath sounds normal. No respiratory distress.  Musculoskeletal: She exhibits no edema.  Psychiatric: She has a normal mood and affect.          Assessment & Plan:

## 2010-12-23 NOTE — Patient Instructions (Signed)
Stay on metformin XR 500 mg with breakfast.  Keep working on low sugar/ low carb diet and aim for getting 30+ min of exercise each day.    Start Losartan 1/2 tab once daily for BP and kidney protection and STOP Maxzide (fluid pill).  Return for f/u diabetes with A1C in 2 months.

## 2010-12-23 NOTE — Assessment & Plan Note (Addendum)
Last A1C from July was 6.8.  Doing well on Metformin XR 500 mg with breakfast for the past 6 wks.  She met with the nutritionist today.  Monofilament normal.  Urine micro neg.  Held flu shot till next OV.  Shingles vaccine done today.  Continue current treatment plan.  Work on [employer], exercise, wt loss.

## 2010-12-23 NOTE — Assessment & Plan Note (Signed)
BP not at goal and since she now has T2DM, will try her on an ARB.  Had cough from ACEi in the past.  Stop Maxzide and replace with 50 mg of losartan daily (cutting the 100s in half to start).  Recheck a BMP in 6-8 wks.

## 2011-03-03 ENCOUNTER — Ambulatory Visit (INDEPENDENT_AMBULATORY_CARE_PROVIDER_SITE_OTHER): Payer: BC Managed Care – PPO | Admitting: Family Medicine

## 2011-03-03 ENCOUNTER — Encounter: Payer: Self-pay | Admitting: Family Medicine

## 2011-03-03 ENCOUNTER — Ambulatory Visit: Payer: BC Managed Care – PPO | Admitting: Family Medicine

## 2011-03-03 DIAGNOSIS — E119 Type 2 diabetes mellitus without complications: Secondary | ICD-10-CM

## 2011-03-03 LAB — POCT GLYCOSYLATED HEMOGLOBIN (HGB A1C): Hemoglobin A1C: 6.1

## 2011-03-03 NOTE — Patient Instructions (Addendum)
Try to get some regular exercise.   Remember to get your yearly eye exam.

## 2011-03-03 NOTE — Progress Notes (Signed)
  Subjective:    Patient ID: Shelby Thomas, female    DOB: 1945-12-19, 65 y.o.   MRN: [id number]  Diabetes She presents for her follow-up diabetic visit. She has type 2 diabetes mellitus. Her disease course has been stable. There are no hypoglycemic associated symptoms. Pertinent negatives for diabetes include no foot paresthesias, no foot ulcerations, no polydipsia, no polyphagia and no polyuria. Symptoms are stable. Risk factors for coronary artery disease include hypertension. Current diabetic treatment includes oral agent (monotherapy). She is compliant with treatment all of the time. She is following a generally healthy diet. She has not had a previous visit with a dietician. She rarely participates in exercise. There is no change in her home blood glucose trend. An ACE inhibitor/angiotensin II receptor blocker is being taken. She does not see a podiatrist.Eye exam is not current.  She just started metformin about 3 months ago and is doing well. No SE of the med. She is having problems with her glucometer so not checking her sugars.     Review of Systems  Genitourinary: Negative for polyuria.  Hematological: Negative for polydipsia and polyphagia.       Objective:   Physical Exam  Constitutional: She is oriented to person, place, and time. She appears well-developed and well-nourished.  HENT:  Head: Normocephalic and atraumatic.  Cardiovascular: Normal rate, regular rhythm and normal heart sounds.   Pulmonary/Chest: Effort normal and breath sounds normal.  Neurological: She is alert and oriented to person, place, and time.  Skin: Skin is warm and dry.  Psychiatric: She has a normal mood and affect. Her behavior is normal.          Assessment & Plan:  Dm- She would like further diabetic education. Will refer to diabetic and nutrition classed. A2C is 6.1 today. Reminded her ot get her eye exam yearly. Keep current regimen. F/U in 4 months.  Call if any concernes or call if needs  new rx for glucometer.

## 2011-03-17 ENCOUNTER — Other Ambulatory Visit: Payer: Self-pay | Admitting: *Deleted

## 2011-03-17 MED ORDER — METFORMIN HCL ER (OSM) 500 MG PO TB24: 500.0000 mg | ORAL_TABLET | Freq: Every day | ORAL | Status: DC

## 2011-03-18 ENCOUNTER — Telehealth: Payer: Self-pay | Admitting: Family Medicine

## 2011-03-18 NOTE — Telephone Encounter (Signed)
Please call patient. Normal mammogram.  Repeat in 1 year.  

## 2011-03-19 NOTE — Telephone Encounter (Signed)
Left message on vm

## 2011-03-31 ENCOUNTER — Other Ambulatory Visit: Payer: Self-pay | Admitting: *Deleted

## 2011-03-31 MED ORDER — LOSARTAN POTASSIUM 100 MG PO TABS: 100.0000 mg | ORAL_TABLET | Freq: Every day | ORAL | Status: DC

## 2011-06-27 ENCOUNTER — Encounter: Payer: Self-pay | Admitting: *Deleted

## 2011-07-03 ENCOUNTER — Encounter: Payer: Self-pay | Admitting: Family Medicine

## 2011-07-03 ENCOUNTER — Ambulatory Visit (INDEPENDENT_AMBULATORY_CARE_PROVIDER_SITE_OTHER): Payer: BC Managed Care – PPO | Admitting: Family Medicine

## 2011-07-03 DIAGNOSIS — E119 Type 2 diabetes mellitus without complications: Secondary | ICD-10-CM

## 2011-07-03 DIAGNOSIS — E785 Hyperlipidemia, unspecified: Secondary | ICD-10-CM

## 2011-07-03 DIAGNOSIS — E669 Obesity, unspecified: Secondary | ICD-10-CM

## 2011-07-03 DIAGNOSIS — I10 Essential (primary) hypertension: Secondary | ICD-10-CM

## 2011-07-03 MED ORDER — CELECOXIB 200 MG PO CAPS: 200.0000 mg | ORAL_CAPSULE | Freq: Every day | ORAL | Status: DC

## 2011-07-03 NOTE — Progress Notes (Signed)
  Subjective:    Patient ID: Shelby Thomas, female    DOB: 1945-11-19, 66 y.o.   MRN: [id number]  Diabetes She presents for her follow-up diabetic visit. She has type 2 diabetes mellitus. Her disease course has been stable. There are no hypoglycemic associated symptoms. There are no diabetic associated symptoms. Pertinent negatives for diabetes include no blurred vision, no foot paresthesias, no foot ulcerations, no polydipsia, no polyphagia, no polyuria and no visual change. There are no hypoglycemic complications. Symptoms are stable. Risk factors for coronary artery disease include hypertension. She is compliant with treatment all of the time. Her weight is stable. She has not had a previous visit with a dietician. She rarely participates in exercise. There is no change in her home blood glucose trend. Her breakfast blood glucose range is generally 110-130 mg/dl.    HTN- is on losartan daily. She says she takes it regularly. Next is pain or shortness of breath. No regular exercise.  Review of Systems  Eyes: Negative for blurred vision.  Genitourinary: Negative for polyuria.  Hematological: Negative for polydipsia and polyphagia.       Objective:   Physical Exam  Constitutional: She is oriented to person, place, and time. She appears well-developed and well-nourished.  HENT:  Head: Normocephalic and atraumatic.  Cardiovascular: Normal rate, regular rhythm and normal heart sounds.        2/6 SEm best heard the right sternal border.   Pulmonary/Chest: Effort normal and breath sounds normal.  Neurological: She is alert and oriented to person, place, and time.  Skin: Skin is warm and dry.  Psychiatric: She has a normal mood and affect. Her behavior is normal.          Assessment & Plan:  DM- Wel controlled. Continue metformin. F?u in 4 monthe. Due for CMP and lipids. She will go today.   Lab Results  Component Value Date   HGBA1C 6.3 07/03/2011    HTN- Well controlled. F/U in 4  months.

## 2011-07-04 ENCOUNTER — Other Ambulatory Visit: Payer: Self-pay | Admitting: Family Medicine

## 2011-07-04 LAB — COMPLETE METABOLIC PANEL WITH GFR
BUN: 13 mg/dL (ref 6–23)
CO2: 32 mEq/L (ref 19–32)
Calcium: 9.2 mg/dL (ref 8.4–10.5)
Chloride: 104 mEq/L (ref 96–112)
Creat: 0.74 mg/dL (ref 0.50–1.10)
GFR, Est African American: >89 mL/min
GFR, Est Non African American: 86 mL/min
Glucose, Bld: 89 mg/dL (ref 70–99)

## 2011-07-04 LAB — LIPID PANEL
Cholesterol: 216 mg/dL — ABNORMAL HIGH (ref 0–200)
Triglycerides: 81 mg/dL (ref <150)
VLDL: 16 mg/dL (ref 0–40)

## 2011-07-04 MED ORDER — SIMVASTATIN 40 MG PO TABS: 40.0000 mg | ORAL_TABLET | Freq: Every evening | ORAL | Status: DC

## 2011-07-30 ENCOUNTER — Other Ambulatory Visit: Payer: Self-pay | Admitting: *Deleted

## 2011-07-30 MED ORDER — LOSARTAN POTASSIUM 100 MG PO TABS: 100.0000 mg | ORAL_TABLET | Freq: Every day | ORAL | Status: DC

## 2011-08-13 ENCOUNTER — Other Ambulatory Visit: Payer: Self-pay | Admitting: *Deleted

## 2011-08-13 MED ORDER — METFORMIN HCL ER (OSM) 500 MG PO TB24: 500.0000 mg | ORAL_TABLET | Freq: Every day | ORAL | Status: DC

## 2011-09-24 ENCOUNTER — Other Ambulatory Visit: Payer: Self-pay | Admitting: *Deleted

## 2011-09-24 MED ORDER — LOSARTAN POTASSIUM 100 MG PO TABS: 100.0000 mg | ORAL_TABLET | Freq: Every day | ORAL | Status: DC

## 2011-12-15 ENCOUNTER — Other Ambulatory Visit: Payer: Self-pay | Admitting: *Deleted

## 2011-12-15 MED ORDER — METFORMIN HCL ER (OSM) 500 MG PO TB24: 500.0000 mg | ORAL_TABLET | Freq: Every day | ORAL | Status: DC

## 2012-01-21 ENCOUNTER — Other Ambulatory Visit: Payer: Self-pay | Admitting: *Deleted

## 2012-01-21 MED ORDER — LOSARTAN POTASSIUM 100 MG PO TABS: 100.0000 mg | ORAL_TABLET | Freq: Every day | ORAL | Status: DC

## 2012-02-11 ENCOUNTER — Other Ambulatory Visit: Payer: Self-pay | Admitting: *Deleted

## 2012-02-11 MED ORDER — SIMVASTATIN 40 MG PO TABS: 40.0000 mg | ORAL_TABLET | Freq: Every evening | ORAL | Status: DC

## 2012-03-15 ENCOUNTER — Other Ambulatory Visit: Payer: Self-pay | Admitting: *Deleted

## 2012-03-26 ENCOUNTER — Other Ambulatory Visit: Payer: Self-pay | Admitting: *Deleted

## 2012-03-31 ENCOUNTER — Encounter: Payer: Self-pay | Admitting: Family Medicine

## 2012-03-31 ENCOUNTER — Ambulatory Visit (INDEPENDENT_AMBULATORY_CARE_PROVIDER_SITE_OTHER): Payer: BC Managed Care – PPO | Admitting: Family Medicine

## 2012-03-31 VITALS — BP 135/70 | HR 97 | Ht 63.0 in | Wt 212.0 lb

## 2012-03-31 DIAGNOSIS — M179 Osteoarthritis of knee, unspecified: Secondary | ICD-10-CM

## 2012-03-31 DIAGNOSIS — E785 Hyperlipidemia, unspecified: Secondary | ICD-10-CM

## 2012-03-31 DIAGNOSIS — M1710 Unilateral primary osteoarthritis, unspecified knee: Secondary | ICD-10-CM

## 2012-03-31 DIAGNOSIS — E119 Type 2 diabetes mellitus without complications: Secondary | ICD-10-CM

## 2012-03-31 DIAGNOSIS — Z23 Encounter for immunization: Secondary | ICD-10-CM

## 2012-03-31 DIAGNOSIS — IMO0002 Reserved for concepts with insufficient information to code with codable children: Secondary | ICD-10-CM

## 2012-03-31 DIAGNOSIS — I739 Peripheral vascular disease, unspecified: Secondary | ICD-10-CM

## 2012-03-31 DIAGNOSIS — I10 Essential (primary) hypertension: Secondary | ICD-10-CM

## 2012-03-31 LAB — POCT GLYCOSYLATED HEMOGLOBIN (HGB A1C): Hemoglobin A1C: 6.6

## 2012-03-31 LAB — POCT UA - MICROALBUMIN: Microalbumin Ur, POC: 10 mg/dL

## 2012-03-31 MED ORDER — LOSARTAN POTASSIUM 25 MG PO TABS: 25.0000 mg | ORAL_TABLET | Freq: Every day | ORAL | Status: DC

## 2012-03-31 NOTE — Progress Notes (Signed)
  Subjective:    Patient ID: Shelby Thomas, female    DOB: [birth year], 66 y.o.   MRN: [id number]  HPI  DM- well controlled. Home sugars running aroudn 115.  No exercise.  She is doing well with her diet.  No wounds that are not heaing well. Takin gmedications reguarly  HTN- No CP or SOB.  Taking meds.  No swelling.  Hyperlipidemia-she's been taking her statin regularly. She denies any myalgias or side effects. No regular exercise.  Bilateral knee pain and leg pain.  Hx of OA in the knees. She last had x-rays in 2009 and had severe degeneration. At that time she started having steroid injections. After a while she said she "got tired of it". She's never had any other input injection such as Synvisc. She is also not interested in knee replacement at this time.   She also complains of leg pain. She says sometimes it keeps her from doing activities that she would like. She says her whole leg will hurt. Resting does seem to help it. She feels as a separate from the knee pain. I did go back in the old notes and saw Dr. Noel Gerold had ordered a peripheral vascular study in 2009 that did show some mild disease.    Review of Systems     Objective:   Physical Exam  Constitutional: She is oriented to person, place, and time. She appears well-developed and well-nourished.  HENT:  Head: Normocephalic and atraumatic.  Neck: Neck supple. No thyromegaly present.  Cardiovascular: Normal rate, regular rhythm and normal heart sounds.        No carotid bruits  Pulmonary/Chest: Effort normal and breath sounds normal.  Lymphadenopathy:    She has no cervical adenopathy.  Neurological: She is alert and oriented to person, place, and time.  Skin: Skin is warm and dry.  Psychiatric: She has a normal mood and affect. Her behavior is normal.    She is obese.      Assessment & Plan:  Diabetes-well-controlled. Continue current regimen. Followup in 3-4 months. Call if any hypoglycemic  events.  Hypertension-well-controlled. Continue current regimen.  Hyperlipidemia-due to recheck lipid levels as well as liver enzymes. For now continue statin if she's tolerating it well.  Osteoarthritis of the knees-recommend she see my partner Dr. Benjamin Stain for further evaluation. She might be a candidate for stem disc. In all honesty she may really need knee replacement but she says she's not willing to do that at this point. I did not repeat her x-rays, as I expect Dr. Benjamin Stain will likely repeat these when he sees her. She also briefly mentioned left hip pain. I encouraged her to discuss this with him as well.  Peripheral vascular disease-she's currently taking a statin. She also takes a baby aspirin. I would like to repeat the arterial studies since it has been four years to see if she has any progression especially since she's had increasing pain in her legs. I did explain to her that walking is often very common treatment for claudication. Anchors her exercises she is able. Continue statin. Continue aspirin.

## 2012-03-31 NOTE — Patient Instructions (Addendum)
Please schedule with Dr T next week for your knees and your left hip.  I will get you set up for the scan of your legs to re-evaluate you for peripheral vascular disease.

## 2012-04-07 LAB — COMPLETE METABOLIC PANEL WITH GFR
Alkaline Phosphatase: 61 U/L (ref 39–117)
BUN: 11 mg/dL (ref 6–23)
CO2: 34 mEq/L — ABNORMAL HIGH (ref 19–32)
Creat: 0.69 mg/dL (ref 0.50–1.10)
GFR, Est African American: >89 mL/min
GFR, Est Non African American: >89 mL/min
Glucose, Bld: 97 mg/dL (ref 70–99)
Sodium: 143 mEq/L (ref 135–145)
Total Bilirubin: 0.5 mg/dL (ref 0.3–1.2)
Total Protein: 7.1 g/dL (ref 6.0–8.3)

## 2012-04-07 LAB — LIPID PANEL: Total CHOL/HDL Ratio: 3.1 Ratio

## 2012-04-08 NOTE — Progress Notes (Signed)
Quick Note:  All labs are normal. ______ 

## 2012-04-13 ENCOUNTER — Telehealth: Payer: Self-pay | Admitting: Family Medicine

## 2012-04-13 NOTE — Telephone Encounter (Signed)
Pt had diagnosed PVD in 2009 therefore we know ABI will be abnormal. I would suggest to start with just scan and if then abnormal could get with exercise.

## 2012-04-13 NOTE — Telephone Encounter (Signed)
Dr. Linford Arnold  I called [facility] at [phone] and spoke with Supervisor-Missy , trying to schedule patient for [place] LOW EXT ART BIL W/EXERCISE as you had ordered and she informed me that Normally, you would order a ABI first IF that is abnormal, you would do a SCAN & IF that is abnormal you would then do the exercise. Please let me know what needs to be done for this patient. Thanks, [employer]

## 2012-04-15 ENCOUNTER — Other Ambulatory Visit: Payer: Self-pay | Admitting: *Deleted

## 2012-04-15 MED ORDER — METFORMIN HCL ER (OSM) 500 MG PO TB24: 500.0000 mg | ORAL_TABLET | Freq: Every day | ORAL | Status: DC

## 2012-04-20 ENCOUNTER — Encounter: Payer: Self-pay | Admitting: *Deleted

## 2012-07-01 ENCOUNTER — Ambulatory Visit (INDEPENDENT_AMBULATORY_CARE_PROVIDER_SITE_OTHER): Payer: BC Managed Care – PPO | Admitting: Family Medicine

## 2012-07-01 ENCOUNTER — Encounter: Payer: Self-pay | Admitting: Family Medicine

## 2012-07-01 VITALS — BP 128/64 | HR 85 | Ht 65.0 in | Wt 210.0 lb

## 2012-07-01 DIAGNOSIS — E119 Type 2 diabetes mellitus without complications: Secondary | ICD-10-CM

## 2012-07-01 DIAGNOSIS — Z23 Encounter for immunization: Secondary | ICD-10-CM

## 2012-07-01 DIAGNOSIS — E669 Obesity, unspecified: Secondary | ICD-10-CM

## 2012-07-01 DIAGNOSIS — E785 Hyperlipidemia, unspecified: Secondary | ICD-10-CM

## 2012-07-01 DIAGNOSIS — I10 Essential (primary) hypertension: Secondary | ICD-10-CM

## 2012-07-01 LAB — POCT GLYCOSYLATED HEMOGLOBIN (HGB A1C): Hemoglobin A1C: 6.5

## 2012-07-01 MED ORDER — PNEUMOCOCCAL VAC POLYVALENT 25 MCG/0.5ML IJ INJ: 0.5000 mL | INJECTION | INTRAMUSCULAR | Status: DC

## 2012-07-01 MED ORDER — PNEUMOCOCCAL VAC POLYVALENT 25 MCG/0.5ML IJ INJ: 0.5000 mL | INJECTION | Freq: Once | INTRAMUSCULAR | Status: DC

## 2012-07-01 NOTE — Patient Instructions (Signed)
Please schedule with Dr. Benjamin Stain for her neck, knees, and left hip.

## 2012-07-01 NOTE — Progress Notes (Signed)
  Subjective:    Patient ID: Shelby Thomas, female    DOB: December 09, 1945, 67 y.o.   MRN: [id number]  HPI DM- Doing well. No hypoglycemic events.  No wounds, sores that aren't healing well.  No regular exercise. Doing well with her diet.  Has a lot of joint pain that keeps her from exercising. Taking metormin w/o S.E.   HTN-  Pt denies chest pain, SOB, dizziness, or heart palpitations.  Taking meds as directed w/o problems.  Denies medication side effects.  No LE edema.   Hyperlpidemia- toleraing simvastatin well. Takes at bedtime. No myalgias.    Review of Systems     Objective:   Physical Exam  Constitutional: She is oriented to person, place, and time. She appears well-developed and well-nourished.  HENT:  Head: Normocephalic and atraumatic.  Eyes: Conjunctivae are normal. Pupils are equal, round, and reactive to light.  Neck: Neck supple. No thyromegaly present.  Cardiovascular: Normal rate, regular rhythm and normal heart sounds.   Pulmonary/Chest: Effort normal and breath sounds normal.  Musculoskeletal: She exhibits no edema.  Lymphadenopathy:    She has no cervical adenopathy.  Neurological: She is alert and oriented to person, place, and time.  Skin: Skin is warm and dry.  Psychiatric: She has a normal mood and affect. Her behavior is normal.   She did have difficulty getting out of the chair because of her hip.       Assessment & Plan:  DM-Well controlled. Eye exam is uptodate.  Doing well. F/U in 4 months. His statin and ARB, and baby aspirin. Pneumonia vaccine updated today.  HTN -Well ocntrolled.  Continue current regimen.  Labs uptodate. Tdap a low-salt diet and exercise as able.  Hyperlpidemia - Well controlled.  Continue current regimen. Lab Results  Component Value Date   CHOL 154 03/31/2012   HDL 50 03/31/2012   LDLCALC 91 03/31/2012   LDLDIRECT 105* 11/23/2008   TRIG 67 03/31/2012   CHOLHDL 3.1 03/31/2012    Will refer to Dr T for her neck, left hip  and knees.

## 2012-07-23 ENCOUNTER — Ambulatory Visit (INDEPENDENT_AMBULATORY_CARE_PROVIDER_SITE_OTHER): Payer: BC Managed Care – PPO | Admitting: Sports Medicine

## 2012-07-23 ENCOUNTER — Encounter: Payer: Self-pay | Admitting: Sports Medicine

## 2012-07-23 ENCOUNTER — Ambulatory Visit (INDEPENDENT_AMBULATORY_CARE_PROVIDER_SITE_OTHER): Payer: BC Managed Care – PPO

## 2012-07-23 VITALS — BP 136/65 | HR 80 | Wt 210.0 lb

## 2012-07-23 DIAGNOSIS — M25559 Pain in unspecified hip: Secondary | ICD-10-CM

## 2012-07-23 DIAGNOSIS — IMO0002 Reserved for concepts with insufficient information to code with codable children: Secondary | ICD-10-CM

## 2012-07-23 DIAGNOSIS — M129 Arthropathy, unspecified: Secondary | ICD-10-CM

## 2012-07-23 DIAGNOSIS — M169 Osteoarthritis of hip, unspecified: Secondary | ICD-10-CM

## 2012-07-23 DIAGNOSIS — M1710 Unilateral primary osteoarthritis, unspecified knee: Secondary | ICD-10-CM

## 2012-07-23 DIAGNOSIS — M1610 Unilateral primary osteoarthritis, unspecified hip: Secondary | ICD-10-CM

## 2012-07-23 MED ORDER — MELOXICAM 15 MG PO TABS: ORAL_TABLET | ORAL | Status: DC

## 2012-07-23 NOTE — Progress Notes (Signed)
Subjective:    I'm seeing this patient as a consultation for:  Dr. Linford Arnold  CC: Pain  HPI: Bilateral knee pain: Has well-known osteoarthritis of both knees, last injection was years ago. She has been resistant to using any type of oral analgesic. Pain is localized at the joint lines, basic difficult for her to walk and get up, she does develop stiffness in the morning and when seated for long periods of time. The pain is localized and doesn't radiate.  Left hip pain: Localized in the groin, worse with ambulation and weightbearing, pains does not radiate, it is moderate to severe. She's never had injection therapy.  Past medical history, Surgical history, Family history not pertinant except as noted below, Social history, Allergies, and medications have been entered into the medical record, reviewed, and no changes needed.   Review of Systems: No headache, visual changes, nausea, vomiting, diarrhea, constipation, dizziness, abdominal pain, skin rash, fevers, chills, night sweats, weight loss, swollen lymph nodes, body aches, joint swelling, muscle aches, chest pain, shortness of breath, mood changes, visual or auditory hallucinations.   Objective:   General: Well Developed, well nourished, and in no acute distress.  Neuro/Psych: Alert and oriented x3, extra-ocular muscles intact, able to move all 4 extremities, sensation grossly intact. Skin: Warm and dry, no rashes noted.  Respiratory: Not using accessory muscles, speaking in full sentences, trachea midline.  Cardiovascular: Pulses palpable, no extremity edema. Abdomen: Does not appear distended. Bilateral Knee: No effusion visible, tender to palpation over both joint lines. ROM full in flexion and extension and lower leg rotation. Ligaments with solid consistent endpoints including ACL, PCL, LCL, MCL. Negative Mcmurray's, Apley's, and Thessalonian tests. Non painful patellar compression. Patellar glide without crepitus. Patellar and  quadriceps tendons unremarkable. Hamstring and quadriceps strength is normal.  Left Hip: Approximately 5 of internal rotation, weak to all movements.  Procedure: Real-time Ultrasound Guided Injection of left knee Device: GE Logiq E  Ultrasound guided injection is preferred based studies that show increased duration, increased effect, greater accuracy, decreased procedural pain, increased response rate, and decreased cost with ultrasound guided versus blind injection.  Verbal informed consent obtained.  Time-out conducted.  Noted no overlying erythema, induration, or other signs of local infection.  Skin prepped in a sterile fashion.  Local anesthesia: Topical Ethyl chloride.  With sterile technique and under real time ultrasound guidance:  2 cc Kenalog 40, 4 cc lidocaine injected easily into the suprapatellar recess Completed without difficulty  Pain immediately resolved suggesting accurate placement of the medication.  Advised to call if fevers/chills, erythema, induration, drainage, or persistent bleeding.  Images permanently stored and available for review in the ultrasound unit.  Impression: Technically successful ultrasound guided injection.  Procedure: Real-time Ultrasound Guided Injection of right knee Device: GE Logiq E  Ultrasound guided injection is preferred based studies that show increased duration, increased effect, greater accuracy, decreased procedural pain, increased response rate, and decreased cost with ultrasound guided versus blind injection.  Verbal informed consent obtained.  Time-out conducted.  Noted no overlying erythema, induration, or other signs of local infection.  Skin prepped in a sterile fashion.  Local anesthesia: Topical Ethyl chloride.  With sterile technique and under real time ultrasound guidance:  2 cc Kenalog 40, 4 cc lidocaine injected easily into the super patellar recess  Completed without difficulty  Pain immediately resolved suggesting  accurate placement of the medication.  Advised to call if fevers/chills, erythema, induration, drainage, or persistent bleeding.  Images permanently stored and available for  review in the ultrasound unit.  Impression: Technically successful ultrasound guided injection.  Procedure: Real-time Ultrasound Guided Injection of Left hip  Device: GE Logiq E  Ultrasound guided injection is preferred based studies that show increased duration, increased effect, greater accuracy, decreased procedural pain, increased response rate, and decreased cost with ultrasound guided versus blind injection.  Verbal informed consent obtained.  Time-out conducted.  Noted no overlying erythema, induration, or other signs of local infection.  Skin prepped in a sterile fashion.  Local anesthesia: Topical Ethyl chloride.  With sterile technique and under real time ultrasound guidance:  Femoral head/neck junction was distorted. Spinal needle advanced to the femoral head/neck junction, bounced off of the bone, at that point, 2 cc Kenalog 40, 4 cc lidocaine injected into the femoroacetabular joint . Completed without difficulty  Pain immediately resolved suggesting accurate placement of the medication.  Advised to call if fevers/chills, erythema, induration, drainage, or persistent bleeding.  Images permanently stored and available for review in the ultrasound unit.  Impression: Technically successful ultrasound guided injection.  X-rays reviewed, is moderate to severe tricompartmental arthritis in both knees and moderate DJD in her left hip.  Impression and Recommendations:   This case required medical decision making of moderate complexity.

## 2012-07-23 NOTE — Assessment & Plan Note (Signed)
Bilateral guided injections. Mobic, physical therapy.

## 2012-07-23 NOTE — Assessment & Plan Note (Signed)
Intra-articular injection as above. Mobic, physical therapy.

## 2012-07-26 ENCOUNTER — Encounter: Payer: Self-pay | Admitting: Physician Assistant

## 2012-07-26 ENCOUNTER — Ambulatory Visit (INDEPENDENT_AMBULATORY_CARE_PROVIDER_SITE_OTHER): Payer: BC Managed Care – PPO | Admitting: Physician Assistant

## 2012-07-26 VITALS — BP 132/67 | HR 78 | Wt 210.0 lb

## 2012-07-26 DIAGNOSIS — R739 Hyperglycemia, unspecified: Secondary | ICD-10-CM

## 2012-07-26 DIAGNOSIS — R7309 Other abnormal glucose: Secondary | ICD-10-CM

## 2012-07-26 DIAGNOSIS — R112 Nausea with vomiting, unspecified: Secondary | ICD-10-CM

## 2012-07-26 DIAGNOSIS — K529 Noninfective gastroenteritis and colitis, unspecified: Secondary | ICD-10-CM

## 2012-07-26 DIAGNOSIS — R42 Dizziness and giddiness: Secondary | ICD-10-CM

## 2012-07-26 DIAGNOSIS — K5289 Other specified noninfective gastroenteritis and colitis: Secondary | ICD-10-CM

## 2012-07-26 MED ORDER — ONDANSETRON HCL 4 MG PO TABS: 4.0000 mg | ORAL_TABLET | Freq: Three times a day (TID) | ORAL | Status: DC | PRN

## 2012-07-26 MED ORDER — PROMETHAZINE HCL 25 MG/ML IJ SOLN
25.0000 mg | Freq: Once | INTRAMUSCULAR | Status: AC
  Administered 2012-07-26: 25 mg via INTRAMUSCULAR

## 2012-07-26 NOTE — Progress Notes (Signed)
  Subjective:    Patient ID: Shelby Thomas, female    DOB: 03-Jul-1945, 67 y.o.   MRN: [id number]  HPI Patient presents to the clinic with dizziness and nausea and weakness. She got up this morning feeling fine but decided to lay back down. When she got up again noticed she felt dizzy and then nauseated. She took her sugar and was 145. She decided to take metformin and then threw it back up. She denies any fever, chills, sinus pressure, or ST. Denies any diarrhea or problems with bowel movements. Her dizziness seems to have resolved. Eating seems to make worse. Has not tried anything else to make better.      Review of Systems     Objective:   Physical Exam  Constitutional: She is oriented to person, place, and time. She appears well-developed and well-nourished.  Obese.  HENT:  Head: Normocephalic and atraumatic.  Right Ear: External ear normal.  Left Ear: External ear normal.  Nose: Nose normal.  Mouth/Throat: Oropharynx is clear and moist.  Eyes: Conjunctivae are normal.  Neck: Normal range of motion. Neck supple.  Cardiovascular: Normal rate, regular rhythm and normal heart sounds.   Pulmonary/Chest: Effort normal and breath sounds normal. She has no wheezes.  Abdominal: Soft. Bowel sounds are normal. She exhibits no distension. There is no tenderness. There is no guarding.  Lymphadenopathy:    She has no cervical adenopathy.  Neurological: She is alert and oriented to person, place, and time.  Skin: Skin is warm and dry.  Psychiatric: She has a normal mood and affect. Her behavior is normal.          Assessment & Plan:  Nausea/dizziness/Gastroenteritis- Glucose was 155. Reassured pt that I did not think her symptoms had anything to do with her sugar. Did explain that in times of illnes sugars tend to be out of control. I would not consider her to be out of control. Recommended that she start checking sugar every morning fasting and keeping a low. Goal 120 if running above  come to see dr. Linford Arnold to test A1C. In the meantime I think you have some gastroenteritis. Discussed BRAT diet and staying hydrated. Will give shot of phenergan and oral to take at home. REST. Call if not improving or worsening.

## 2012-07-26 NOTE — Patient Instructions (Addendum)
I want you to keep a closer look sugar for the next week check daily fasting. Keep a log. Goal is under 120.  Will give anti-nausea in office.   B.R.A.T. Diet Your doctor has recommended the B.R.A.T. diet for you or your child until the condition improves. This is often used to help control diarrhea and vomiting symptoms. If you or your child can tolerate clear liquids, you may have:  Bananas.   Rice.   Applesauce.   Toast (and other simple starches such as crackers, potatoes, noodles).  Be sure to avoid dairy products, meats, and fatty foods until symptoms are better. Fruit juices such as apple, grape, and prune juice can make diarrhea worse. Avoid these. Continue this diet for 2 days or as instructed by your caregiver. Document Released: 04/21/2005 Document Revised: 04/10/2011 Document Reviewed: 10/08/2006 [facility] Patient Information 2012 [place], [place].  Viral Gastroenteritis Viral gastroenteritis is also known as stomach flu. This condition affects the stomach and intestinal tract. It can cause sudden diarrhea and vomiting. The illness typically lasts 3 to 8 days. Most people develop an immune response that eventually gets rid of the virus. While this natural response develops, the virus can make you quite ill. CAUSES  Many different viruses can cause gastroenteritis, such as rotavirus or noroviruses. You can catch one of these viruses by consuming contaminated food or water. You may also catch a virus by sharing utensils or other personal items with an infected person or by touching a contaminated surface. SYMPTOMS  The most common symptoms are diarrhea and vomiting. These problems can cause a severe loss of body fluids (dehydration) and a body salt (electrolyte) imbalance. Other symptoms may include:  Fever.  Headache.  Fatigue.  Abdominal pain. DIAGNOSIS  Your caregiver can usually diagnose viral gastroenteritis based on your symptoms and a physical exam. A stool sample may  also be taken to test for the presence of viruses or other infections. TREATMENT  This illness typically goes away on its own. Treatments are aimed at rehydration. The most serious cases of viral gastroenteritis involve vomiting so severely that you are not able to keep fluids down. In these cases, fluids must be given through an intravenous line (IV). HOME CARE INSTRUCTIONS   Drink enough fluids to keep your urine clear or pale yellow. Drink small amounts of fluids frequently and increase the amounts as tolerated.  Ask your caregiver for specific rehydration instructions.  Avoid:  Foods high in sugar.  Alcohol.  Carbonated drinks.  Tobacco.  Juice.  Caffeine drinks.  Extremely hot or cold fluids.  Fatty, greasy foods.  Too much intake of anything at one time.  Dairy products until 24 to 48 hours after diarrhea stops.  You may consume probiotics. Probiotics are active cultures of beneficial bacteria. They may lessen the amount and number of diarrheal stools in adults. Probiotics can be found in yogurt with active cultures and in supplements.  Wash your hands well to avoid spreading the virus.  Only take over-the-counter or prescription medicines for pain, discomfort, or fever as directed by your caregiver. Do not give aspirin to children. Antidiarrheal medicines are not recommended.  Ask your caregiver if you should continue to take your regular prescribed and over-the-counter medicines.  Keep all follow-up appointments as directed by your caregiver. SEEK IMMEDIATE MEDICAL CARE IF:   You are unable to keep fluids down.  You do not urinate at least once every 6 to 8 hours.  You develop shortness of breath.  You notice  blood in your stool or vomit. This may look like coffee grounds.  You have abdominal pain that increases or is concentrated in one small area (localized).  You have persistent vomiting or diarrhea.  You have a fever.  The patient is a child younger  than 3 months, and he or she has a fever.  The patient is a child older than 3 months, and he or she has a fever and persistent symptoms.  The patient is a child older than 3 months, and he or she has a fever and symptoms suddenly get worse.  The patient is a baby, and he or she has no tears when crying. MAKE SURE YOU:   Understand these instructions.  Will watch your condition.  Will get help right away if you are not doing well or get worse. Document Released: 04/21/2005 Document Revised: 07/14/2011 Document Reviewed: 02/05/2011 [facility] Patient Information 2013 [place], [place].

## 2012-08-16 ENCOUNTER — Other Ambulatory Visit: Payer: Self-pay | Admitting: *Deleted

## 2012-08-16 MED ORDER — METFORMIN HCL ER (OSM) 500 MG PO TB24: 500.0000 mg | ORAL_TABLET | Freq: Every day | ORAL | Status: DC

## 2012-08-23 ENCOUNTER — Ambulatory Visit: Payer: BC Managed Care – PPO | Admitting: Sports Medicine

## 2012-08-24 ENCOUNTER — Ambulatory Visit (INDEPENDENT_AMBULATORY_CARE_PROVIDER_SITE_OTHER): Payer: BC Managed Care – PPO | Admitting: Sports Medicine

## 2012-08-24 ENCOUNTER — Encounter: Payer: Self-pay | Admitting: Sports Medicine

## 2012-08-24 VITALS — BP 125/70 | HR 77 | Wt 203.0 lb

## 2012-08-24 DIAGNOSIS — M1610 Unilateral primary osteoarthritis, unspecified hip: Secondary | ICD-10-CM

## 2012-08-24 DIAGNOSIS — M129 Arthropathy, unspecified: Secondary | ICD-10-CM

## 2012-08-24 NOTE — Assessment & Plan Note (Signed)
Pain has dropped to 1/10 from a 10 out of 10 after injection to both knees. Follow up in 3 months for this.

## 2012-08-24 NOTE — Progress Notes (Signed)
   Subjective:    CC:  Followup  HPI: Left hip osteoarthritis: Pain has decreased from a 10 out of 10 to a 2/10 after intra-articular injection. She continues to work with physical therapy and has a few more weeks left.  Bilateral knee osteoarthritis: Pain is decreased from a 10 out of 10 to a 1/10 after bilateral intra-articular injections as well as formal physical therapy.  Past medical history, Surgical history, Family history not pertinant except as noted below, Social history, Allergies, and medications have been entered into the medical record, reviewed, and no changes needed.   Review of Systems: No headache, visual changes, nausea, vomiting, diarrhea, constipation, dizziness, abdominal pain, skin rash, fevers, chills, night sweats, weight loss, swollen lymph nodes, body aches, joint swelling, muscle aches, chest pain, shortness of breath, mood changes, visual or auditory hallucinations.   Objective:   General: Well Developed, well nourished, and in no acute distress.  Neuro/Psych: Alert and oriented x3, extra-ocular muscles intact, able to move all 4 extremities, sensation grossly intact. Skin: Warm and dry, no rashes noted.  Respiratory: Not using accessory muscles, speaking in full sentences, trachea midline.  Cardiovascular: Pulses palpable, no extremity edema. Abdomen: Does not appear distended. Impression and Recommendations:   This case required medical decision making of moderate complexity.

## 2012-08-24 NOTE — Assessment & Plan Note (Signed)
Still doing physical therapy and continuing to make progress. Pain is decreased down to a 2/10 from 10 out of 10. Return in 3 months.

## 2012-09-15 ENCOUNTER — Other Ambulatory Visit: Payer: Self-pay | Admitting: *Deleted

## 2012-09-15 MED ORDER — METFORMIN HCL ER (OSM) 500 MG PO TB24: 500.0000 mg | ORAL_TABLET | Freq: Every day | ORAL | Status: DC

## 2012-10-29 ENCOUNTER — Telehealth: Payer: Self-pay | Admitting: Family Medicine

## 2012-10-29 ENCOUNTER — Ambulatory Visit (INDEPENDENT_AMBULATORY_CARE_PROVIDER_SITE_OTHER): Payer: BC Managed Care – PPO | Admitting: Family Medicine

## 2012-10-29 ENCOUNTER — Encounter: Payer: Self-pay | Admitting: Family Medicine

## 2012-10-29 VITALS — BP 135/74 | HR 85 | Ht 63.6 in | Wt 203.0 lb

## 2012-10-29 DIAGNOSIS — E1169 Type 2 diabetes mellitus with other specified complication: Secondary | ICD-10-CM

## 2012-10-29 DIAGNOSIS — E119 Type 2 diabetes mellitus without complications: Secondary | ICD-10-CM

## 2012-10-29 DIAGNOSIS — E669 Obesity, unspecified: Secondary | ICD-10-CM

## 2012-10-29 DIAGNOSIS — M7701 Medial epicondylitis, right elbow: Secondary | ICD-10-CM

## 2012-10-29 DIAGNOSIS — M1610 Unilateral primary osteoarthritis, unspecified hip: Secondary | ICD-10-CM

## 2012-10-29 DIAGNOSIS — M25539 Pain in unspecified wrist: Secondary | ICD-10-CM

## 2012-10-29 LAB — LIPID PANEL
HDL: 61 mg/dL (ref >39)
LDL Cholesterol: 103 mg/dL — ABNORMAL HIGH (ref 0–99)
Triglycerides: 77 mg/dL (ref <150)
VLDL: 15 mg/dL (ref 0–40)

## 2012-10-29 LAB — COMPLETE METABOLIC PANEL WITH GFR
ALT: 19 U/L (ref 0–35)
AST: 20 U/L (ref 0–37)
Calcium: 9.5 mg/dL (ref 8.4–10.5)
Chloride: 105 mEq/L (ref 96–112)
Creat: 0.65 mg/dL (ref 0.50–1.10)
Sodium: 145 mEq/L (ref 135–145)
Total Bilirubin: 0.5 mg/dL (ref 0.3–1.2)
Total Protein: 6.9 g/dL (ref 6.0–8.3)

## 2012-10-29 LAB — POCT GLYCOSYLATED HEMOGLOBIN (HGB A1C): Hemoglobin A1C: 6.4

## 2012-10-29 MED ORDER — METFORMIN HCL ER (OSM) 500 MG PO TB24: 500.0000 mg | ORAL_TABLET | Freq: Every day | ORAL | Status: DC

## 2012-10-29 MED ORDER — MELOXICAM 15 MG PO TABS: ORAL_TABLET | ORAL | Status: DC

## 2012-10-29 NOTE — Progress Notes (Signed)
Subjective:    Patient ID: Shelby Thomas, female    DOB: 01/31/1946, 67 y.o.   MRN: [id number]  HPI DM- doing well overall.  No wounds that aren't healing well. Feels she is eating well. Getting somerexercise.  Takes a baby ASA.    Right elbow pain x 1 months. Hard to reach up. Feels sore and stiff.  Painful with movement. Radiates up an down her arm.  She is left handed.  Has been using some OTC pain reliever.  No swelling. No heat or ice.  Has been using a menthol cream.    Review of Systems     BP 135/74  Pulse 85  Ht 5' 3.6" (1.615 m)  Wt 203 lb (92.08 kg)  BMI 35.3 kg/m2    Allergies  Allergen Reactions  . Ramipril     REACTION: cough    Past Medical History  Diagnosis Date  . Heart disease, congenital     large ASD, Sees Dr. Elmarie Shiley  . Foraminal stenosis of lumbar region     L4--L5  . 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 Scoliosis     severe  . Hypertension     pulmonary/on nighttime O2  . Pneumonia 2006  . Hyperlipidemia     dyslipidemia  . Hiatal hernia   . GERD (gastroesophageal reflux disease)   . CHF (congestive heart failure)     mild  . Spine misalignment     T spine 80% to right, L-Spine 55% to left    Past Surgical History  Procedure Laterality Date  . Cardiac surgery      repair of ASD  . Esophageal dilation  04-09    dilation/Dr. Inge Rise    History   Social History  . Marital Status: Married    Spouse Name: N/A    Number of Children: N/A  . Years of Education: N/A   Occupational History  . Not on file.   Social History Main Topics  . Smoking status: Never Smoker   . Smokeless tobacco: Not on file  . Alcohol Use: No  . Drug Use:   . 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    Other Topics Concern  . Not on file   Social History Narrative  . No narrative on file    Family History  Problem Relation Age of Onset  .  Diabetes Mother   . Stroke Mother     Outpatient Encounter Prescriptions as of 10/29/2012  Medication Sig Dispense Refill  . AMBULATORY NON FORMULARY MEDICATION Medication Name: Free Style Insu[employer]x test strips.  Use daily as directed  50 strip  12  . aspirin 81 MG tablet Take 81 mg by mouth daily.        . Calcium Carbonate-Vitamin D (CALCIUM-VITAMIN D) 500-200 MG-UNIT per tablet Take 1 tablet by mouth 2 (two) times daily with a meal.        . losartan (COZAAR) 25 MG tablet Take 1 tablet (25 mg total) by mouth daily.  90 tablet  2  . meloxicam (MOBIC) 15 MG tablet One tab PO qAM with breakfast for 2 weeks, then daily prn pain.  30 tablet  3  . metformin (FORTAMET) 500 MG (OSM) 24 hr tablet Take 1 tablet (500 mg total) by mouth daily with breakfast.  30 tablet  6  . Multiple Vitamin (MULTIVITAMIN) capsule Take 1  capsule by mouth daily.        Marland Kitchen omeprazole (PRILOSEC) 20 MG capsule       . simvastatin (ZOCOR) 40 MG tablet Take 1 tablet (40 mg total) by mouth every evening.  30 tablet  6  . [DISCONTINUED] meloxicam (MOBIC) 15 MG tablet One tab PO qAM with breakfast for 2 weeks, then daily prn pain.  30 tablet  3  . [DISCONTINUED] metformin (FORTAMET) 500 MG (OSM) 24 hr tablet Take 1 tablet (500 mg total) by mouth daily with breakfast.  30 tablet  0  . [DISCONTINUED] ondansetron (ZOFRAN) 4 MG tablet Take 1 tablet (4 mg total) by mouth every 8 (eight) hours as needed for nausea.  20 tablet  0   No facility-administered encounter medications on file as of 10/29/2012.       Objective:   Physical Exam  Constitutional: She is oriented to person, place, and time. She appears well-developed and well-nourished.  HENT:  Head: Normocephalic and atraumatic.  Cardiovascular: Normal rate, regular rhythm and normal heart sounds.   Pulmonary/Chest: Effort normal and breath sounds normal.  Musculoskeletal:  Right elbow in mildly tender alont the medial epicondyle but not sig tender.  Pain in that area with  supination of wrist.  Painful in that area when reaches above her head.  Shoulder with NROM. Wrist and fingers with NROM. No weakness in the fingers.   Neurological: She is alert and oriented to person, place, and time.  Skin: Skin is warm and dry.  Psychiatric: She has a normal mood and affect. Her behavior is normal.          Assessment & Plan:  DM- on ARB, statin and ASA.  WEll controlle.  Lab Results  Component Value Date   HGBA1C 6.4 10/29/2012   Right medial epicondylitis - I believe her right elbow pain is most consistent with right medial epicondylitis. Also consider that she could have some type of nerve entrapment in the elbow that she's not having any numbness or tingling into the hand. She's not super tender right over the epicondyles but this really is the main area of her discomfort. Will treat with Voltaren gel and exercises. Handout given. If she's not improving over the next 3 weeks I encouraged her call back. At that point I would like to get her in with my partner Dr. Rodney Langton for further evaluation and treatment.

## 2012-10-29 NOTE — Telephone Encounter (Signed)
Call pt: the topical gel for her elbow was not covered on her insurance to sent an oral anti-inflammatory.

## 2012-10-29 NOTE — Telephone Encounter (Signed)
Pharmacy calls and states they got an rx for Mobic and pt states she thought you were gonna send in the Voltaren gel- if so can you please send in the gel to them. Barry Dienes, LPN

## 2012-10-29 NOTE — Telephone Encounter (Signed)
Called pt unable to reach her to inform her that her ins will not cover the voltaren gel. Informed her son Ivin Booty to let her know that her insurance will not cover the gel only the oral med.Loralee Pacas [facility] pharmacy and lvm stating that this is not covered under her ins plan.Loralee Pacas [place]

## 2012-11-30 ENCOUNTER — Ambulatory Visit: Payer: BC Managed Care – PPO | Admitting: Sports Medicine

## 2013-02-16 ENCOUNTER — Encounter: Payer: Self-pay | Admitting: Family Medicine

## 2013-02-24 ENCOUNTER — Ambulatory Visit (INDEPENDENT_AMBULATORY_CARE_PROVIDER_SITE_OTHER): Payer: BC Managed Care – PPO | Admitting: Sports Medicine

## 2013-02-24 ENCOUNTER — Ambulatory Visit (INDEPENDENT_AMBULATORY_CARE_PROVIDER_SITE_OTHER): Payer: BC Managed Care – PPO

## 2013-02-24 ENCOUNTER — Encounter: Payer: Self-pay | Admitting: Sports Medicine

## 2013-02-24 VITALS — BP 137/70 | HR 105 | Wt 208.0 lb

## 2013-02-24 DIAGNOSIS — R059 Cough, unspecified: Secondary | ICD-10-CM

## 2013-02-24 DIAGNOSIS — R05 Cough: Secondary | ICD-10-CM | POA: Insufficient documentation

## 2013-02-24 DIAGNOSIS — R062 Wheezing: Secondary | ICD-10-CM

## 2013-02-24 DIAGNOSIS — M4180 Other forms of scoliosis, site unspecified: Secondary | ICD-10-CM

## 2013-02-24 DIAGNOSIS — R058 Other specified cough: Secondary | ICD-10-CM | POA: Insufficient documentation

## 2013-02-24 DIAGNOSIS — R918 Other nonspecific abnormal finding of lung field: Secondary | ICD-10-CM

## 2013-02-24 MED ORDER — ALBUTEROL SULFATE HFA 108 (90 BASE) MCG/ACT IN AERS: 2.0000 | INHALATION_SPRAY | Freq: Four times a day (QID) | RESPIRATORY_TRACT | Status: DC | PRN

## 2013-02-24 MED ORDER — PREDNISONE 50 MG PO TABS: 50.0000 mg | ORAL_TABLET | Freq: Every day | ORAL | Status: DC

## 2013-02-24 MED ORDER — AZITHROMYCIN 250 MG PO TABS: ORAL_TABLET | ORAL | Status: DC

## 2013-02-24 NOTE — Progress Notes (Signed)
  Subjective:    CC: Cough  HPI: This is a pleasant 67 year old female with a history of pulmonary hypertension who presents with a productive cough. The cough began one week ago and is worsening. She has no history of asthma or COPD. She is coughing up some greenish sputum. She has developed some chest pain associated with the cough. She also reports some shortness of breath and wheezing. She denies any fever or chills. She feels quite fatigued. Her husband has been sick recently with a cold. Symptoms are moderate, persistent.  Past medical history, Surgical history, Family history not pertinant except as noted below, Social history, Allergies, and medications have been entered into the medical record, reviewed, and no changes needed.   Review of Systems: No fevers, chills, night sweats, weight loss, chest pain, or shortness of breath.   Objective:    General: Well Developed, well nourished, and in no acute distress.  Neuro: Alert and oriented x3, extra-ocular muscles intact, sensation grossly intact.  HEENT: Normocephalic, atraumatic, pupils equal round reactive to light, neck supple, no masses, no lymphadenopathy, thyroid nonpalpable.  Skin: Warm and dry, no rashes. Cardiac: Regular rate and rhythm, no murmurs rubs or gallops, no lower extremity edema.  Respiratory: Bilateral expiratory wheezes. Not using accessory muscles, speaking in full sentences. O2 saturation 90% increased to 93% after breathing treatment.  Impression and Recommendations:   Assessment: This is a 67 year old female with a persistent cough and diffuse wheezes whose presentation is consistent with acute bronchitis or community-acquired pneumonia.  Plan: 1. Chest X-ray 2. Albuterol and Ipratropium breathing treatment  3. Begin course of prednisone 4. Begin course of azithromycin 5. Return for follow-up next week  This note was originally written by Karin Lieu MS3.

## 2013-02-24 NOTE — Assessment & Plan Note (Addendum)
Chest x-ray, albuterol and Atrovent nebs. Refilling albuterol. Prednisone, azithromycin. Return mid next week to make sure she is doing better.  X-rays do show what appears to be a right lower versus middle lobe pneumonia, difficult to tell due to her severe kyphoscoliosis. She is covered for community-acquired pneumonia.

## 2013-02-28 ENCOUNTER — Ambulatory Visit (INDEPENDENT_AMBULATORY_CARE_PROVIDER_SITE_OTHER): Payer: BC Managed Care – PPO | Admitting: Family Medicine

## 2013-02-28 ENCOUNTER — Encounter: Payer: Self-pay | Admitting: Family Medicine

## 2013-02-28 VITALS — BP 131/70 | HR 83 | Wt 211.0 lb

## 2013-02-28 DIAGNOSIS — E119 Type 2 diabetes mellitus without complications: Secondary | ICD-10-CM

## 2013-02-28 DIAGNOSIS — J189 Pneumonia, unspecified organism: Secondary | ICD-10-CM

## 2013-02-28 DIAGNOSIS — Z23 Encounter for immunization: Secondary | ICD-10-CM

## 2013-02-28 DIAGNOSIS — I10 Essential (primary) hypertension: Secondary | ICD-10-CM

## 2013-02-28 LAB — POCT UA - MICROALBUMIN
Albumin/Creatinine Ratio, Urine, POC: <30
Creatinine, POC: 300 mg/dL
Microalbumin Ur, POC: 30 mg/L

## 2013-02-28 LAB — POCT GLYCOSYLATED HEMOGLOBIN (HGB A1C): Hemoglobin A1C: 6.5

## 2013-02-28 NOTE — Addendum Note (Signed)
Addended by: Chalmers Cater on: 02/28/2013 12:56 PM   Modules accepted: Orders

## 2013-02-28 NOTE — Progress Notes (Signed)
Subjective:    Patient ID: Shelby Thomas, female    DOB: 1945/07/01, 67 y.o.   MRN: [id number]  HPI PNA - She is feeling better and will finise her ABX today and is on prednisone.  No fever, chills or sweats. Still some wet coough but is better she says it has not been keeping her up at night as much. She has a little bit of shortness of breath but feels better overall.  HTN-  Pt denies chest pain, SOB, dizziness, or heart palpitations.  Taking meds as directed w/o problems.  Denies medication side effects.    DM - No hypoglycemic events. No wounds that aren't healing well. HgA1C today, eye exam 01/31/2013, foot exam 03/31/2012   Review of Systems  BP 131/70  Pulse 83  Wt 211 lb (95.709 kg)  BMI 36.7 kg/m2  SpO2 94%    Allergies  Allergen Reactions  . Ramipril     REACTION: cough    Past Medical History  Diagnosis Date  . Heart disease, congenital     large ASD, Sees Dr. Elmarie Shiley  . Foraminal stenosis of lumbar region     L4--L5  . 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 Scoliosis     severe  . Hypertension     pulmonary/on nighttime O2  . Pneumonia 2006  . Hyperlipidemia     dyslipidemia  . Hiatal hernia   . GERD (gastroesophageal reflux disease)   . CHF (congestive heart failure)     mild  . Spine misalignment     T spine 80% to right, L-Spine 55% to left    Past Surgical History  Procedure Laterality Date  . Cardiac surgery      repair of ASD  . Esophageal dilation  04-09    dilation/Dr. Inge Rise    History   Social History  . Marital Status: Married    Spouse Name: N/A    Number of Children: N/A  . Years of Education: N/A   Occupational History  . Not on file.   Social History Main Topics  . Smoking status: Never Smoker   . Smokeless tobacco: Not on file  . Alcohol Use: No  . Drug Use:   . 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    Other  Topics Concern  . Not on file   Social History Narrative  . No narrative on file    Family History  Problem Relation Age of Onset  . Diabetes Mother   . Stroke Mother     Outpatient Encounter Prescriptions as of 02/28/2013  Medication Sig Dispense Refill  . albuterol (PROVENTIL HFA;VENTOLIN HFA) 108 (90 BASE) MCG/ACT inhaler Inhale 2 puffs into the lungs every 6 (six) hours as needed for wheezing.  2 Inhaler  11  . AMBULATORY NON FORMULARY MEDICATION Medication Name: Free Style InsuLinx test strips.  Use daily as directed  50 strip  12  . aspirin 81 MG tablet Take 81 mg by mouth daily.        Marland Kitchen azithromycin (ZITHROMAX Z-PAK) 250 MG tablet Take 2 tablets (500 mg) on  Day 1,  followed by 1 tablet (250 mg) once daily on Days 2 through 5.  6 each  0  . Calcium Carbonate-Vitamin D (CALCIUM-VITAMIN D) 500-200 MG-UNIT per tablet Take 1 tablet by mouth 2 (two) times daily with a meal.        .  losartan (COZAAR) 25 MG tablet Take 1 tablet (25 mg total) by mouth daily.  90 tablet  2  . metformin (FORTAMET) 500 MG (OSM) 24 hr tablet Take 1 tablet (500 mg total) by mouth daily with breakfast.  30 tablet  6  . Multiple Vitamin (MULTIVITAMIN) capsule Take 1 capsule by mouth daily.        Marland Kitchen omeprazole (PRILOSEC) 20 MG capsule       . predniSONE (DELTASONE) 50 MG tablet Take 1 tablet (50 mg total) by mouth daily.  5 tablet  0  . simvastatin (ZOCOR) 40 MG tablet Take 1 tablet (40 mg total) by mouth every evening.  30 tablet  6  . [DISCONTINUED] meloxicam (MOBIC) 15 MG tablet One tab PO qAM with breakfast for 2 weeks, then daily prn pain.  30 tablet  3   No facility-administered encounter medications on file as of 02/28/2013.          Objective:   Physical Exam  Constitutional: She is oriented to person, place, and time. She appears well-developed and well-nourished.  HENT:  Head: Normocephalic and atraumatic.  Cardiovascular: Normal rate, regular rhythm and normal heart sounds.    Pulmonary/Chest: Effort normal. She has wheezes.  Diffuse inspiratory wheeze  Neurological: She is alert and oriented to person, place, and time.  Skin: Skin is warm and dry.  Psychiatric: She has a normal mood and affect. Her behavior is normal.          Assessment & Plan:  Pneumonia - Improving. Complete antibiotic and complete prednisone. Not significantly better in one week then please let me know.  HTn - Well controlled. F/U in 6 months.   DM- well controlled.  Continue current regimen. Followup in 4 months. She will try to do a urine micron been in sample today. If she's unable to give a sample we will collect a followup visit.  Flu shot given today.

## 2013-03-29 ENCOUNTER — Other Ambulatory Visit: Payer: Self-pay | Admitting: *Deleted

## 2013-03-29 MED ORDER — LOSARTAN POTASSIUM 25 MG PO TABS: 25.0000 mg | ORAL_TABLET | Freq: Every day | ORAL | Status: DC

## 2013-04-05 ENCOUNTER — Other Ambulatory Visit: Payer: Self-pay | Admitting: *Deleted

## 2013-04-05 MED ORDER — SIMVASTATIN 40 MG PO TABS: 40.0000 mg | ORAL_TABLET | Freq: Every evening | ORAL | Status: DC

## 2013-04-21 ENCOUNTER — Encounter: Payer: Self-pay | Admitting: Family Medicine

## 2013-04-21 ENCOUNTER — Ambulatory Visit (INDEPENDENT_AMBULATORY_CARE_PROVIDER_SITE_OTHER): Payer: BC Managed Care – PPO

## 2013-04-21 ENCOUNTER — Other Ambulatory Visit: Payer: Self-pay | Admitting: Family Medicine

## 2013-04-21 ENCOUNTER — Ambulatory Visit (INDEPENDENT_AMBULATORY_CARE_PROVIDER_SITE_OTHER): Payer: BC Managed Care – PPO | Admitting: Family Medicine

## 2013-04-21 VITALS — BP 180/86 | HR 114 | Temp 97.4°F | Wt 221.0 lb

## 2013-04-21 DIAGNOSIS — J9819 Other pulmonary collapse: Secondary | ICD-10-CM

## 2013-04-21 DIAGNOSIS — R0602 Shortness of breath: Secondary | ICD-10-CM

## 2013-04-21 DIAGNOSIS — R609 Edema, unspecified: Secondary | ICD-10-CM

## 2013-04-21 DIAGNOSIS — R600 Localized edema: Secondary | ICD-10-CM

## 2013-04-21 DIAGNOSIS — R0989 Other specified symptoms and signs involving the circulatory and respiratory systems: Secondary | ICD-10-CM

## 2013-04-21 DIAGNOSIS — J984 Other disorders of lung: Secondary | ICD-10-CM

## 2013-04-21 MED ORDER — FUROSEMIDE 40 MG PO TABS: 40.0000 mg | ORAL_TABLET | Freq: Every day | ORAL | Status: DC | PRN

## 2013-04-21 MED ORDER — FUROSEMIDE 10 MG/ML IJ SOLN
40.0000 mg | Freq: Once | INTRAMUSCULAR | Status: AC
  Administered 2013-04-21: 40 mg via INTRAMUSCULAR

## 2013-04-21 NOTE — Progress Notes (Signed)
Subjective:    Patient ID: Shelby Thomas, female    DOB: 08-10-1945, 67 y.o.   MRN: [id number]  HPI   Ankle swelling started 4 days ago. Then the ankles started feeling tight.  Left is worse.  Had pneumonia before Thanksgivint. Still has had a slight cough. Her BP is up.  Her back started hurting and started taking mobic and says her BP went up. She stopped it 3 days ago.  Weight is up 10 lbs since  November. Has been some SOb with ambulation.  No CP.  Back is feeling better.  Last echo was a few years ago.  No cough or URI sxs.   Review of Systems  BP 180/86  Pulse 114  Temp(Src) 97.4 F (36.3 C)  Wt 221 lb (100.245 kg)  SpO2 90%    Allergies  Allergen Reactions  . Ramipril     REACTION: cough    Past Medical History  Diagnosis Date  . Heart disease, congenital     large ASD, Sees Dr. Elmarie Shiley  . Foraminal stenosis of lumbar region     L4--L5  . 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 Scoliosis     severe  . Hypertension     pulmonary/on nighttime O2  . Pneumonia 2006  . Hyperlipidemia     dyslipidemia  . Hiatal hernia   . GERD (gastroesophageal reflux disease)   . CHF (congestive heart failure)     mild  . Spine misalignment     T spine 80% to right, L-Spine 55% to left    Past Surgical History  Procedure Laterality Date  . Cardiac surgery      repair of ASD  . Esophageal dilation  04-09    dilation/Dr. Inge Rise    History   Social History  . Marital Status: Married    Spouse Name: N/A    Number of Children: N/A  . Years of Education: N/A   Occupational History  . Not on file.   Social History Main Topics  . Smoking status: Never Smoker   . Smokeless tobacco: Not on file  . Alcohol Use: No  . Drug Use:   . 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    Other Topics Concern  . Not on file   Social History Narrative  . No narrative on file     Family History  Problem Relation Age of Onset  . Diabetes Mother   . Stroke Mother     Outpatient Encounter Prescriptions as of 04/21/2013  Medication Sig  . albuterol (PROVENTIL HFA;VENTOLIN HFA) 108 (90 BASE) MCG/ACT inhaler Inhale 2 puffs into the lungs every 6 (six) hours as needed for wheezing.  . AMBULATORY NON FORMULARY MEDICATION Medication Name: Free Style InsuLinx test strips.  Use daily as directed  . aspirin 81 MG tablet Take 81 mg by mouth daily.    Marland Kitchen losartan (COZAAR) 25 MG tablet Take 1 tablet (25 mg total) by mouth daily.  . metformin (FORTAMET) 500 MG (OSM) 24 hr tablet Take 1 tablet (500 mg total) by mouth daily with breakfast.  . Multiple Vitamin (MULTIVITAMIN) capsule Take 1 capsule by mouth daily.    Marland Kitchen omeprazole (PRILOSEC) 20 MG capsule   . simvastatin (ZOCOR) 40 MG tablet Take 1 tablet (40 mg total) by mouth every evening.  . [DISCONTINUED] azithromycin (ZITHROMAX Z-PAK) 250 MG tablet  Take 2 tablets (500 mg) on  Day 1,  followed by 1 tablet (250 mg) once daily on Days 2 through 5.  . [DISCONTINUED] Calcium Carbonate-Vitamin D (CALCIUM-VITAMIN D) 500-200 MG-UNIT per tablet Take 1 tablet by mouth 2 (two) times daily with a meal.    . [DISCONTINUED] predniSONE (DELTASONE) 50 MG tablet Take 1 tablet (50 mg total) by mouth daily.  . [EXPIRED] furosemide (LASIX) injection 40 mg           Objective:   Physical Exam  Constitutional: She is oriented to person, place, and time. She appears well-developed and well-nourished.  HENT:  Head: Normocephalic and atraumatic.  Neck: Neck supple. No thyromegaly present.  Cardiovascular: Normal rate, regular rhythm and normal heart sounds.   No carotid or abdominal bruits.  Pulmonary/Chest: Effort normal and breath sounds normal.  Abdominal: Soft. Bowel sounds are normal. She exhibits no distension and no mass. There is no tenderness. There is no rebound and no guarding.  Musculoskeletal: She exhibits edema.  1+ pitting  edema of her knees bilaterally.  Lymphadenopathy:    She has no cervical adenopathy.  Neurological: She is alert and oriented to person, place, and time.  Skin: Skin is warm and dry.  Psychiatric: She has a normal mood and affect. Her behavior is normal.          Assessment & Plan:  LE edema - Consider renal injury vs CHF vs fluid retnecion relate to HTN and salt intake. Consider infectious cause like PNA.  Given 40 mg of IM Lasix for acute relief. We'll likely send her prescription for oral Lasix for the next few days and over the weekend. I would like to see her back on Monday to make sure she is improving and getting better. She does have a history of valvular heart disease so may consider further evaluation with echocardiogram for congestive heart failure.  SOB - pulse ox down a little. Will get CXR to eval for pulmonary edema.

## 2013-04-22 LAB — COMPLETE METABOLIC PANEL WITH GFR
ALT: 18 U/L (ref 0–35)
Albumin: 4 g/dL (ref 3.5–5.2)
Alkaline Phosphatase: 78 U/L (ref 39–117)
CO2: 33 mEq/L — ABNORMAL HIGH (ref 19–32)
Creat: 0.57 mg/dL (ref 0.50–1.10)
GFR, Est African American: >89 mL/min
Potassium: 4.2 mEq/L (ref 3.5–5.3)
Sodium: 145 mEq/L (ref 135–145)
Total Bilirubin: 0.6 mg/dL (ref 0.3–1.2)
Total Protein: 7.1 g/dL (ref 6.0–8.3)

## 2013-04-22 LAB — CBC WITH DIFFERENTIAL/PLATELET
Basophils Relative: 1 % (ref 0–1)
Eosinophils Absolute: 0.2 10*3/uL (ref 0.0–0.7)
Eosinophils Relative: 2 % (ref 0–5)
HCT: 37.7 % (ref 36.0–46.0)
Hemoglobin: 11.9 g/dL — ABNORMAL LOW (ref 12.0–15.0)
MCH: 30.1 pg (ref 26.0–34.0)
MCHC: 31.6 g/dL (ref 30.0–36.0)
MCV: 95.2 fL (ref 78.0–100.0)
Monocytes Absolute: 0.7 10*3/uL (ref 0.1–1.0)
Monocytes Relative: 8 % (ref 3–12)
Neutrophils Relative %: 70 % (ref 43–77)
Platelets: 349 10*3/uL (ref 150–400)
RBC: 3.96 MIL/uL (ref 3.87–5.11)

## 2013-04-22 LAB — BRAIN NATRIURETIC PEPTIDE: Brain Natriuretic Peptide: 73.1 pg/mL (ref 0.0–100.0)

## 2013-05-02 ENCOUNTER — Ambulatory Visit (INDEPENDENT_AMBULATORY_CARE_PROVIDER_SITE_OTHER): Payer: BC Managed Care – PPO | Admitting: Family Medicine

## 2013-05-02 ENCOUNTER — Encounter: Payer: Self-pay | Admitting: Family Medicine

## 2013-05-02 VITALS — BP 142/74 | HR 113 | Temp 97.8°F | Wt 210.0 lb

## 2013-05-02 DIAGNOSIS — R600 Localized edema: Secondary | ICD-10-CM

## 2013-05-02 DIAGNOSIS — R609 Edema, unspecified: Secondary | ICD-10-CM

## 2013-05-02 DIAGNOSIS — R0602 Shortness of breath: Secondary | ICD-10-CM

## 2013-05-02 NOTE — Progress Notes (Signed)
   Subjective:    Patient ID: Shelby Thomas, female    DOB: 28-May-1945, 67 y.o.   MRN: [id number]  HPI  F/U SOB and LE edema  - She is feeling some better. Still c/o fo some swellin in the LE.  Still some SOB but better. Weight down 11 lbs on lasix. Tolerating well with no S.E. No CP.   CXR just showed pulmonary congestion  Review of Systems     Objective:   Physical Exam  Constitutional: She is oriented to person, place, and time. She appears well-developed and well-nourished.  HENT:  Head: Normocephalic and atraumatic.  Cardiovascular: Normal rate, regular rhythm and normal heart sounds.   Pulmonary/Chest: Effort normal and breath sounds normal.  Musculoskeletal: She exhibits edema.  Trace ankle edema bilat to mid tibia bilat.   Neurological: She is alert and oriented to person, place, and time.  Skin: Skin is warm and dry.  Psychiatric: She has a normal mood and affect. Her behavior is normal.          Assessment & Plan:  LE edema/SOB - Will order echo.  Hx of pulmonary HTN and valvular heart disease.  No sure when last had echo.  Continue lasix. Will check potassium. May need to add to her regimen. Concerning for CHF. May need to estab with cardiology.

## 2013-05-03 LAB — COMPLETE METABOLIC PANEL WITH GFR
ALT: 16 U/L (ref 0–35)
AST: 18 U/L (ref 0–37)
Albumin: 3.9 g/dL (ref 3.5–5.2)
Alkaline Phosphatase: 83 U/L (ref 39–117)
BUN: 13 mg/dL (ref 6–23)
GFR, Est Non African American: 83 mL/min
Glucose, Bld: 115 mg/dL — ABNORMAL HIGH (ref 70–99)
Potassium: 4.1 mEq/L (ref 3.5–5.3)
Sodium: 137 mEq/L (ref 135–145)
Total Bilirubin: 0.4 mg/dL (ref 0.3–1.2)

## 2013-05-20 ENCOUNTER — Other Ambulatory Visit: Payer: Self-pay | Admitting: *Deleted

## 2013-05-20 MED ORDER — FUROSEMIDE 40 MG PO TABS: 40.0000 mg | ORAL_TABLET | Freq: Every day | ORAL | Status: DC | PRN

## 2013-05-30 ENCOUNTER — Telehealth: Payer: Self-pay | Admitting: *Deleted

## 2013-05-30 ENCOUNTER — Ambulatory Visit (INDEPENDENT_AMBULATORY_CARE_PROVIDER_SITE_OTHER): Payer: 59 | Admitting: Family Medicine

## 2013-05-30 ENCOUNTER — Encounter: Payer: Self-pay | Admitting: Family Medicine

## 2013-05-30 ENCOUNTER — Telehealth: Payer: Self-pay

## 2013-05-30 VITALS — BP 132/63 | HR 94 | Temp 97.8°F | Ht 62.6 in | Wt 211.0 lb

## 2013-05-30 DIAGNOSIS — R600 Localized edema: Secondary | ICD-10-CM

## 2013-05-30 DIAGNOSIS — R0602 Shortness of breath: Secondary | ICD-10-CM

## 2013-05-30 DIAGNOSIS — M25559 Pain in unspecified hip: Secondary | ICD-10-CM

## 2013-05-30 DIAGNOSIS — I2789 Other specified pulmonary heart diseases: Secondary | ICD-10-CM

## 2013-05-30 DIAGNOSIS — R609 Edema, unspecified: Secondary | ICD-10-CM

## 2013-05-30 DIAGNOSIS — M25552 Pain in left hip: Secondary | ICD-10-CM

## 2013-05-30 LAB — BASIC METABOLIC PANEL WITH GFR
BUN: 11 mg/dL (ref 6–23)
CO2: 33 mEq/L — ABNORMAL HIGH (ref 19–32)
Calcium: 9.7 mg/dL (ref 8.4–10.5)
Chloride: 100 mEq/L (ref 96–112)
Creat: 0.74 mg/dL (ref 0.50–1.10)
GFR, Est African American: >89 mL/min
GFR, Est Non African American: 85 mL/min
Glucose, Bld: 116 mg/dL — ABNORMAL HIGH (ref 70–99)
POTASSIUM: 4.3 meq/L (ref 3.5–5.3)
Sodium: 142 mEq/L (ref 135–145)

## 2013-05-30 MED ORDER — TRAMADOL HCL 50 MG PO TABS: 50.0000 mg | ORAL_TABLET | Freq: Three times a day (TID) | ORAL | Status: DC | PRN

## 2013-05-30 NOTE — Telephone Encounter (Signed)
PA obtained for Echo complete. Auth # [id number].  Meyer CoryMisty Jamell Laymon, LPN

## 2013-05-30 NOTE — Progress Notes (Signed)
   Subjective:    Patient ID: Shelby Thomas, female    DOB: 11/18/1945, 68 y.o.   MRN: [id number]  HPI F/U SOB and LE edema -overall she feels she is much better. The ankle swelling is most completely resolved and her shortness of breath is improved though not resolved. We have scheduled her for an echocardiogram 4 weeks ago but she says she never got a call about it.  Left hip pain for several weeks, maybe > 1 month. No meds for her pain. Pain is mostly over the groin crease and radiates around to her buttock cheek. Hx of scoliosis. No falls or injuries. Painful getting up and down. Did PT and had an injection last march.    Review of Systems     Objective:   Physical Exam  Constitutional: She is oriented to person, place, and time. She appears well-developed and well-nourished.  HENT:  Head: Normocephalic and atraumatic.  Cardiovascular: Normal rate, regular rhythm and normal heart sounds.   Pulmonary/Chest: Effort normal and breath sounds normal.  Musculoskeletal: She exhibits no edema.  Significant pain in the left hip with external rotation. Some pain with internal rotation. She's tender over the left lower back just lateral to the SI joint. She's mildly tender over the greater trochanter. Hip, knee, ankle strength is 5 out of 5 bilaterally.  Neurological: She is alert and oriented to person, place, and time.  Skin: Skin is warm and dry.  Psychiatric: She has a normal mood and affect. Her behavior is normal.          Assessment & Plan:  Shortness of breath/lower extremity edema-significantly improved. Continue Lasix for now. We'll check a BMP to make sure that potassium and creatinine function are stable. We'll also make sure that the cardiogram get scheduled. Still concerned about possible congestive heart failure.   SOB - pulse ox is still down to 91% But still stable. Hx of pulmonary hypertension  Left hip osteoarthritis-she did have an x-ray last April showing moderate  degenerative disease as well as spurring and sclerosis. She did get relief with an injection that lasted about 1-2 months last April. She also did physical therapy. Her pain has become more significant in the last month or 2. I will get her back in with Dr. Benjamin Stainhekkekandam for repeat evaluation.

## 2013-05-30 NOTE — Telephone Encounter (Signed)
[employer] insurance requires a prior auth for [facility] for Shortness of breath and lower extremity edema.

## 2013-06-02 ENCOUNTER — Encounter: Payer: Self-pay | Admitting: Sports Medicine

## 2013-06-02 ENCOUNTER — Ambulatory Visit (INDEPENDENT_AMBULATORY_CARE_PROVIDER_SITE_OTHER): Payer: 59 | Admitting: Sports Medicine

## 2013-06-02 VITALS — BP 126/69 | HR 93 | Ht 62.0 in | Wt 215.0 lb

## 2013-06-02 DIAGNOSIS — M1710 Unilateral primary osteoarthritis, unspecified knee: Secondary | ICD-10-CM

## 2013-06-02 DIAGNOSIS — IMO0002 Reserved for concepts with insufficient information to code with codable children: Secondary | ICD-10-CM

## 2013-06-02 DIAGNOSIS — M1612 Unilateral primary osteoarthritis, left hip: Secondary | ICD-10-CM

## 2013-06-02 DIAGNOSIS — M129 Arthropathy, unspecified: Secondary | ICD-10-CM

## 2013-06-02 DIAGNOSIS — M170 Bilateral primary osteoarthritis of knee: Secondary | ICD-10-CM

## 2013-06-02 DIAGNOSIS — M169 Osteoarthritis of hip, unspecified: Secondary | ICD-10-CM

## 2013-06-02 DIAGNOSIS — M1610 Unilateral primary osteoarthritis, unspecified hip: Secondary | ICD-10-CM

## 2013-06-02 NOTE — Assessment & Plan Note (Signed)
Last injection was in March of 2014. Repeat left femoral acetabular joint injection. She has very little motion in the joint, I do suspect her arthritis has progressed to the point where she is a candidate for total hip arthroplasty. I am going to place the injection today and I'm going to refer her to Dr. Gean BirchwoodFrank Rowan for consideration of replacement.

## 2013-06-02 NOTE — Progress Notes (Signed)
  Subjective:    CC: Arthritis  HPI:  Left hip osteoarthritis: Good response to the last femoral acetabular joint injection performed approximately 10 months ago. Now has a recurrence of pain in her left groin worse with weightbearing, moderate, persistent.  Bilateral knee osteoarthritis: Right knee continues to be pain free 10 months after injection, left knee is starting to hurt again at both joint lines, worse with weightbearing.  Past medical history, Surgical history, Family history not pertinant except as noted below, Social history, Allergies, and medications have been entered into the medical record, reviewed, and no changes needed.   Review of Systems: No fevers, chills, night sweats, weight loss, chest pain, or shortness of breath.   Objective:    General: Well Developed, well nourished, and in no acute distress.  Neuro: Alert and oriented x3, extra-ocular muscles intact, sensation grossly intact.  HEENT: Normocephalic, atraumatic, pupils equal round reactive to light, neck supple, no masses, no lymphadenopathy, thyroid nonpalpable.  Skin: Warm and dry, no rashes. Cardiac: Regular rate and rhythm, no murmurs rubs or gallops, no lower extremity edema.  Respiratory: Clear to auscultation bilaterally. Not using accessory muscles, speaking in full sentences. Left hip: Very little motion with approximately 5 of internal and external rotation, both directions are very painful. Left knee: Tender to palpation at the joint lines. No effusion.  Procedure: Real-time Ultrasound Guided Injection of Left femoroacetabular joint Device: GE Logiq E  Verbal informed consent obtained.  Time-out conducted.  Noted no overlying erythema, induration, or other signs of local infection.  Skin prepped in a sterile fashion.  Local anesthesia: Topical Ethyl chloride.  With sterile technique and under real time ultrasound guidance:  I had great difficulty visualizing her joint due to the body habitus  and severity of osteoarthritis, once the joint was visualized a spinal needle was advanced to the femoral head/neck junction, capsule was pierced, and 2 cc Kenalog 40, 4 cc lidocaine injected easily. Completed without difficulty  Pain immediately resolved suggesting accurate placement of the medication.  Advised to call if fevers/chills, erythema, induration, drainage, or persistent bleeding.  Images permanently stored and available for review in the ultrasound unit.  Impression: Technically successful ultrasound guided injection.  Procedure: Real-time Ultrasound Guided Injection of left knee Device: GE Logiq E  Verbal informed consent obtained.  Time-out conducted.  Noted no overlying erythema, induration, or other signs of local infection.  Skin prepped in a sterile fashion.  Local anesthesia: Topical Ethyl chloride.  With sterile technique and under real time ultrasound guidance:  2 cc Kenalog 40, 4 cc lidocaine injected easily into the suprapatellar recess. Completed without difficulty  Pain immediately resolved suggesting accurate placement of the medication.  Advised to call if fevers/chills, erythema, induration, drainage, or persistent bleeding.  Images permanently stored and available for review in the ultrasound unit.  Impression: Technically successful ultrasound guided injection.  Impression and Recommendations:

## 2013-06-02 NOTE — Assessment & Plan Note (Signed)
Right knee continues to be pain free after injection one year ago. Repeat injection to the left hip as above. Return in one month.

## 2013-06-06 NOTE — Telephone Encounter (Signed)
Pt informed she has not seen a cardiologist in many years. [place] was [place] in [place].Loralee PacasBarkley, Tamar Miano [place]

## 2013-06-06 NOTE — Telephone Encounter (Signed)
Call patient: Echocardiogram shows ejection fraction of 60-65%. This is in the normal range. Some slight abnormality with relaxation of the heart. Also right ventricle systolic pressure is elevated between 50 and 60 mmHg axis consistent with moderately severe pulmonary hypertension and there some mild aortic sclerosis but with good valve opening. No major changes. Does she have a cardiologist.

## 2013-06-09 NOTE — Telephone Encounter (Signed)
Told they do (Dr. Shirlee LatchMcLean in particular). Referral placed.Shelby Thomas, Shelby Thomas

## 2013-06-09 NOTE — Telephone Encounter (Signed)
Do me a favor and call [facility] and see if they usually handle pulmonary hypertension or if they prefer Pulmonology follow and monitor. If cards follows then please place referral for Dr. Jens Somrenshaw downstairs.

## 2013-06-10 ENCOUNTER — Encounter: Payer: Self-pay | Admitting: Family Medicine

## 2013-06-13 ENCOUNTER — Other Ambulatory Visit: Payer: Self-pay | Admitting: *Deleted

## 2013-06-13 MED ORDER — METFORMIN HCL ER (OSM) 500 MG PO TB24: 500.0000 mg | ORAL_TABLET | Freq: Every day | ORAL | Status: DC

## 2013-06-28 ENCOUNTER — Encounter: Payer: Self-pay | Admitting: Cardiology

## 2013-06-29 ENCOUNTER — Institutional Professional Consult (permissible substitution): Payer: 59 | Admitting: Cardiology

## 2013-07-01 ENCOUNTER — Ambulatory Visit: Payer: BC Managed Care – PPO | Admitting: Family Medicine

## 2013-07-04 ENCOUNTER — Ambulatory Visit (INDEPENDENT_AMBULATORY_CARE_PROVIDER_SITE_OTHER): Payer: 59 | Admitting: Sports Medicine

## 2013-07-04 ENCOUNTER — Encounter: Payer: Self-pay | Admitting: Sports Medicine

## 2013-07-04 VITALS — BP 124/67 | HR 93 | Ht 62.0 in | Wt 216.0 lb

## 2013-07-04 DIAGNOSIS — M1610 Unilateral primary osteoarthritis, unspecified hip: Secondary | ICD-10-CM

## 2013-07-04 DIAGNOSIS — IMO0002 Reserved for concepts with insufficient information to code with codable children: Secondary | ICD-10-CM

## 2013-07-04 DIAGNOSIS — M1710 Unilateral primary osteoarthritis, unspecified knee: Secondary | ICD-10-CM

## 2013-07-04 DIAGNOSIS — M169 Osteoarthritis of hip, unspecified: Secondary | ICD-10-CM

## 2013-07-04 DIAGNOSIS — M5416 Radiculopathy, lumbar region: Secondary | ICD-10-CM

## 2013-07-04 DIAGNOSIS — M1612 Unilateral primary osteoarthritis, left hip: Secondary | ICD-10-CM

## 2013-07-04 DIAGNOSIS — M170 Bilateral primary osteoarthritis of knee: Secondary | ICD-10-CM

## 2013-07-04 NOTE — Assessment & Plan Note (Signed)
Pain continues to be resolved after left-sided femoral acetabular joint injection month ago, previous injection last 10 months.

## 2013-07-04 NOTE — Assessment & Plan Note (Signed)
Pain continues to resolve after left knee injection a month ago.

## 2013-07-04 NOTE — Progress Notes (Signed)
  Subjective:    CC: Follow up  HPI: Left hip osteoarthritis: Completely resolved after femoral acetabular joint injection monthly.  Left knee osteoarthritis: Continues to be resolved after injection month ago.  Low-back pain: Localized in the midline of the lower lumbar spine, radiating down the left leg in S1 distribution, worse with flexion and Valsalva. Moderate, persistent, has not done any physical therapy or other conservative measures. Pain is mild and of her she doesn't want me to treat just yet.  Past medical history, Surgical history, Family history not pertinant except as noted below, Social history, Allergies, and medications have been entered into the medical record, reviewed, and no changes needed.   Review of Systems: No fevers, chills, night sweats, weight loss, chest pain, or shortness of breath.   Objective:    General: Well Developed, well nourished, and in no acute distress.  Neuro: Alert and oriented x3, extra-ocular muscles intact, sensation grossly intact.  HEENT: Normocephalic, atraumatic, pupils equal round reactive to light, neck supple, no masses, no lymphadenopathy, thyroid nonpalpable.  Skin: Warm and dry, no rashes. Cardiac: Regular rate and rhythm, no murmurs rubs or gallops, no lower extremity edema.  Respiratory: Clear to auscultation bilaterally. Not using accessory muscles, speaking in full sentences.  Impression and Recommendations:

## 2013-07-04 NOTE — Assessment & Plan Note (Signed)
Clinically left-sided S1 radiculopathy. Home rehabilitation exercises, she's not yet ready for advanced imaging or formal physical therapy. She does have some idiopathic scoliosis however he did think her pain is predominantly degenerative.

## 2013-07-13 ENCOUNTER — Other Ambulatory Visit: Payer: Self-pay | Admitting: *Deleted

## 2013-07-13 MED ORDER — FUROSEMIDE 40 MG PO TABS: 40.0000 mg | ORAL_TABLET | Freq: Every day | ORAL | Status: DC | PRN

## 2013-07-27 ENCOUNTER — Encounter: Payer: Self-pay | Admitting: Cardiology

## 2013-07-27 ENCOUNTER — Ambulatory Visit (INDEPENDENT_AMBULATORY_CARE_PROVIDER_SITE_OTHER): Payer: 59 | Admitting: Cardiology

## 2013-07-27 VITALS — BP 134/80 | HR 84 | Ht 62.0 in | Wt 214.0 lb

## 2013-07-27 DIAGNOSIS — I10 Essential (primary) hypertension: Secondary | ICD-10-CM

## 2013-07-27 DIAGNOSIS — E785 Hyperlipidemia, unspecified: Secondary | ICD-10-CM

## 2013-07-27 DIAGNOSIS — I2789 Other specified pulmonary heart diseases: Secondary | ICD-10-CM

## 2013-07-27 DIAGNOSIS — R0602 Shortness of breath: Secondary | ICD-10-CM

## 2013-07-27 MED ORDER — FUROSEMIDE 40 MG PO TABS: 40.0000 mg | ORAL_TABLET | Freq: Every day | ORAL | Status: DC

## 2013-07-27 NOTE — Progress Notes (Signed)
HPI: 68 year old female for evaluation of dyspnea. Patient has had previous repair of an ASD when she was 68 years old at [facility]. She has mild chronic dyspnea on exertion. Beginning last fall her dyspnea increased. It has progressed particularly since January. She developed bilateral lower extremity edema. No chest pain, palpitations or syncope. She was seen by primary care and given Lasix with some improvement. Cardiology is asked to evaluate. Chest x-ray in December of 2014 showed pulmonary vascular congestion and right lower lobe scar/atelectasis. Laboratories in December of 2014 showed a BNP of 73. Hemoglobin 11.9, BUN 12 and creatinine 0.57. Echocardiogram in January of 2015 in [place] showed normal LV function, abnormal diastolic function, moderate left atrial enlargement, moderate right ventricular enlargement, mild right atrial enlargement and moderately severe pulmonary hypertension.  Current Outpatient Prescriptions  Medication Sig Dispense Refill  . AMBULATORY NON FORMULARY MEDICATION Medication Name: Free Style InsuLinx test strips.  Use daily as directed  50 strip  12  . aspirin 81 MG tablet Take 81 mg by mouth daily.        . furosemide (LASIX) 40 MG tablet Take 1 tablet (40 mg total) by mouth daily as needed for fluid or edema.  30 tablet  0  . losartan (COZAAR) 25 MG tablet Take 1 tablet (25 mg total) by mouth daily.  90 tablet  2  . metformin (FORTAMET) 500 MG (OSM) 24 hr tablet Take 1 tablet (500 mg total) by mouth daily with breakfast.  30 tablet  6  . Multiple Vitamin (MULTIVITAMIN) capsule Take 1 capsule by mouth daily.        Marland Kitchen omeprazole (PRILOSEC) 20 MG capsule Take 20 mg by mouth as needed.       . simvastatin (ZOCOR) 40 MG tablet Take 1 tablet (40 mg total) by mouth every evening.  30 tablet  6  . traMADol (ULTRAM) 50 MG tablet Take 1 tablet (50 mg total) by mouth every 8 (eight) hours as needed for moderate pain or severe pain.  45 tablet  0   No  current facility-administered medications for this visit.    Allergies  Allergen Reactions  . Ramipril     REACTION: cough    Past Medical History  Diagnosis Date  . Heart disease, congenital     large ASD, Sees Dr. Elmarie Shiley  . Foraminal stenosis of lumbar region     L4--L5  . 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 Scoliosis     severe  . Hypertension     pulmonary/on nighttime O2  . Pneumonia 2006  . Hyperlipidemia     dyslipidemia  . Hiatal hernia   . GERD (gastroesophageal reflux disease)   . CHF (congestive heart failure)     mild  . Spine misalignment     T spine 80% to right, L-Spine 55% to left  . Diabetes mellitus   . OSA (obstructive sleep apnea)     Past Surgical History  Procedure Laterality Date  . Cardiac surgery      repair of ASD  . Esophageal dilation  04-09    dilation/Dr. Inge Rise    History   Social History  . Marital Status: Married    Spouse Name: N/A    Number of Children: 2  . Years of Education: N/A   Occupational History  . Not on file.   Social History Main Topics  . Smoking status: Never Smoker   . Smokeless tobacco: Not  on file  . Alcohol Use: No  . Drug Use:   . 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    Other Topics Concern  . Not on file   Social History Narrative  . No narrative on file    Family History  Problem Relation Age of Onset  . Diabetes Mother   . Stroke Mother   . CAD Mother     CABG in her 5870s    ROS: no fevers or chills, productive cough, hemoptysis, dysphasia, odynophagia, melena, hematochezia, dysuria, hematuria, rash, seizure activity, orthopnea, PND, claudication. Remaining systems are negative.  Physical Exam:   Blood pressure 134/80, pulse 84, height 5\' 2"  (1.575 m), weight 214 lb (97.07 kg).  General:  Well developed/obese in NAD Skin warm/dry Patient not depressed No peripheral  clubbing Back-normal HEENT-normal/normal eyelids Neck supple/normal carotid upstroke bilaterally; no bruits; no JVD; no thyromegaly chest - CTA/ normal expansion CV - RRR/normal S1 and S2; no murmurs, rubs;  PMI nondisplaced, positive gallop Abdomen -NT/ND, no HSM, no mass, + bowel sounds, no bruit 2+ femoral pulses, no bruits Ext-1+ edema, no chords, 2+ DP Neuro-grossly nonfocal  ECG sinus rhythm at a rate of 84. Left anterior fascicular block. Right bundle branch block. Cannot rule out prior septal infarct. Left ventricular hypertrophy.

## 2013-07-27 NOTE — Assessment & Plan Note (Signed)
Patient was noted to have moderately severe pulmonary hypertension on recent echocardiogram. This is most likely contributing to her dyspnea. She was noted to have vascular congestion on her chest x-ray and there may be a component of pulmonary venous hypertension. Change Lasix to 40 mg daily. Check potassium and renal function in one week. She is volume overloaded on examination. I will also schedule pulmonary function tests with DLCO, repeat echocardiogram with bubble to exclude residual shunt from previous ASD repair, and rheumatologic labs. There may be a component of OHS and OSA; may need sleep study in the future. We can consider right heart catheterization in the future if her symptoms worsen.

## 2013-07-27 NOTE — Assessment & Plan Note (Signed)
Continue statin. 

## 2013-07-27 NOTE — Patient Instructions (Signed)
Your physician recommends that you schedule a follow-up appointment in: 12 WEEKS WITH DR Jens SomRENSHAW  Your physician has requested that you have an echocardiogram. Echocardiography is a painless test that uses sound waves to create images of your heart. It provides your doctor with information about the size and shape of your heart and how well your heart's chambers and valves are working. This procedure takes approximately one hour. There are no restrictions for this procedure.   INCREASE FUROSEMIDE TO 40 MG ONCE DAILY  Your physician recommends that you return for lab work in: ONE WEEK  [facility]  Your physician has recommended that you have a pulmonary function test. Pulmonary Function Tests are a group of tests that measure how well air moves in and out of your lungs.

## 2013-07-27 NOTE — Assessment & Plan Note (Signed)
Blood pressure controlled. Continue present medications. 

## 2013-07-29 ENCOUNTER — Encounter: Payer: Self-pay | Admitting: Family Medicine

## 2013-07-29 ENCOUNTER — Ambulatory Visit (INDEPENDENT_AMBULATORY_CARE_PROVIDER_SITE_OTHER): Payer: 59 | Admitting: Family Medicine

## 2013-07-29 VITALS — BP 141/79 | HR 102 | Wt 209.0 lb

## 2013-07-29 DIAGNOSIS — I2789 Other specified pulmonary heart diseases: Secondary | ICD-10-CM

## 2013-07-29 DIAGNOSIS — E119 Type 2 diabetes mellitus without complications: Secondary | ICD-10-CM

## 2013-07-29 DIAGNOSIS — I10 Essential (primary) hypertension: Secondary | ICD-10-CM

## 2013-07-29 DIAGNOSIS — E669 Obesity, unspecified: Secondary | ICD-10-CM

## 2013-07-29 DIAGNOSIS — E1169 Type 2 diabetes mellitus with other specified complication: Secondary | ICD-10-CM

## 2013-07-29 LAB — POCT GLYCOSYLATED HEMOGLOBIN (HGB A1C): Hemoglobin A1C: 7

## 2013-07-29 MED ORDER — METFORMIN HCL 500 MG PO TABS: 500.0000 mg | ORAL_TABLET | Freq: Two times a day (BID) | ORAL | Status: DC

## 2013-07-29 NOTE — Progress Notes (Signed)
Subjective:    Patient ID: Shelby Thomas, female    DOB: 09-04-1945, 68 y.o.   MRN: [id number]  HPI Hypertension- Pt denies chest pain, SOB, dizziness, or heart palpitations.  Taking meds as directed w/o problems.  Denies medication side effects.  Breathing is ok.  No CP.    Diabetes-says she's been taking her metformin regularly. She's been on for quite some time. Has not been checking her sugars very often recently. She's been inactive secondary to her hip and knee pain. She is actually seeing an orthopedist this afternoon for it.  Pulmonary hypertension-she actually feels like her swelling in her lower extremities a little bit better today. She is wearing some light grade compression stockings.  Review of Systems  BP 141/79  Pulse 102  Wt 209 lb (94.802 kg)  SpO2 93%    Allergies  Allergen Reactions  . Ramipril     REACTION: cough    Past Medical History  Diagnosis Date  . Heart disease, congenital     large ASD, Sees Dr. Elmarie Shiley  . Foraminal stenosis of lumbar region     L4--L5  . 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 Scoliosis     severe  . Hypertension     pulmonary/on nighttime O2  . Pneumonia 2006  . Hyperlipidemia     dyslipidemia  . Hiatal hernia   . GERD (gastroesophageal reflux disease)   . CHF (congestive heart failure)     mild  . Spine misalignment     T spine 80% to right, L-Spine 55% to left  . Diabetes mellitus   . OSA (obstructive sleep apnea)     Past Surgical History  Procedure Laterality Date  . Cardiac surgery      repair of ASD  . Esophageal dilation  04-09    dilation/Dr. Inge Rise    History   Social History  . Marital Status: Married    Spouse Name: N/A    Number of Children: 2  . Years of Education: N/A   Occupational History  . Not on file.   Social History Main Topics  . Smoking status: Never Smoker   . Smokeless tobacco: Not on file  . Alcohol Use: No  . Drug Use:   . 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    Other Topics Concern  . Not on file   Social History Narrative  . No narrative on file    Family History  Problem Relation Age of Onset  . Diabetes Mother   . Stroke Mother   . CAD Mother     CABG in her 72s    Outpatient Encounter Prescriptions as of 07/29/2013  Medication Sig  . AMBULATORY NON FORMULARY MEDICATION Medication Name: Free Style InsuLinx test strips.  Use daily as directed  . aspirin 81 MG tablet Take 81 mg by mouth daily.    . furosemide (LASIX) 40 MG tablet Take 1 tablet (40 mg total) by mouth daily.  Marland Kitchen losartan (COZAAR) 25 MG tablet Take 1 tablet (25 mg total) by mouth daily.  . Multiple Vitamin (MULTIVITAMIN) capsule Take 1 capsule by mouth daily.    Marland Kitchen omeprazole (PRILOSEC) 20 MG capsule Take 20 mg by mouth as needed.   . simvastatin (ZOCOR) 40 MG tablet Take 1 tablet (40 mg total) by mouth every evening.  . traMADol (ULTRAM) 50 MG tablet Take 1  tablet (50 mg total) by mouth every 8 (eight) hours as needed for moderate pain or severe pain.  . [DISCONTINUED] metformin (FORTAMET) 500 MG (OSM) 24 hr tablet Take 1 tablet (500 mg total) by mouth daily with breakfast.  . metFORMIN (GLUCOPHAGE) 500 MG tablet Take 1 tablet (500 mg total) by mouth 2 (two) times daily with a meal.          Objective:   Physical Exam  Constitutional: She is oriented to person, place, and time. She appears well-developed and well-nourished.  HENT:  Head: Normocephalic and atraumatic.  Cardiovascular: Normal rate, regular rhythm and normal heart sounds.   Pulmonary/Chest: Effort normal and breath sounds normal.  Musculoskeletal:  Trace edema in both ankles bilaterally but overall they look great. She does have some light grade compression stockings on today. Dorsal pedal pulses are 2+ bilaterally.  Neurological: She is alert and oriented to person, place, and time.  Skin: Skin is  warm and dry.  Psychiatric: She has a normal mood and affect. Her behavior is normal.          Assessment & Plan:  HTN -  Well controlled at home. We'll continue to monitor carefully. It looked great when she was a cardiologist couple of days ago. We'll repeat and follow up in 3 months.  Diabetes- well controlled but up to 7.0 today.  Discussed increasing her metformin to twice a day. She really wants to stay with once a day and work on her diet. I think that's reasonable for the next 3 months. At that point time she still elevated then we'll adjust her metformin to twice a day. She says she's always been on once a day dosing.  pulmonary hypertension-just some trace edema in the ankles bilaterally but overall they look great. Dorsal pedal pulses are 2+ as well. Continue 40 mg of Lasix. She has been on this dose for the last 3 months has been tolerating it well. She does have a lab slip to check her electrolytes in the next week ordered by Dr. Jens Somrenshaw. She's also getting additional rheumatologic workup.

## 2013-08-02 LAB — BASIC METABOLIC PANEL WITH GFR
BUN: 20 mg/dL (ref 6–23)
CALCIUM: 9.4 mg/dL (ref 8.4–10.5)
CO2: 30 mEq/L (ref 19–32)
Chloride: 98 mEq/L (ref 96–112)
Creat: 0.73 mg/dL (ref 0.50–1.10)
GFR, EST NON AFRICAN AMERICAN: 86 mL/min
Glucose, Bld: 153 mg/dL — ABNORMAL HIGH (ref 70–99)
POTASSIUM: 4.8 meq/L (ref 3.5–5.3)
SODIUM: 135 meq/L (ref 135–145)

## 2013-08-02 LAB — ANA: Anti Nuclear Antibody(ANA): POSITIVE — AB

## 2013-08-02 LAB — ANTI-NUCLEAR AB-TITER (ANA TITER): ANA TITER 1: NEGATIVE (ref <1:40)

## 2013-08-02 LAB — RHEUMATOID FACTOR: Rheumatoid fact SerPl-aCnc: <10 [IU]/mL

## 2013-08-02 LAB — SEDIMENTATION RATE: Sed Rate: 16 mm/hr (ref 0–22)

## 2013-08-04 ENCOUNTER — Ambulatory Visit (HOSPITAL_COMMUNITY)
Admission: RE | Admit: 2013-08-04 | Discharge: 2013-08-04 | Disposition: A | Discharge status: Home / Self Care | Payer: Medicare Other | Source: Ambulatory Visit | Attending: Cardiology | Admitting: Cardiology

## 2013-08-04 DIAGNOSIS — J984 Other disorders of lung: Secondary | ICD-10-CM | POA: Insufficient documentation

## 2013-08-04 DIAGNOSIS — R0602 Shortness of breath: Secondary | ICD-10-CM | POA: Insufficient documentation

## 2013-08-04 LAB — PULMONARY FUNCTION TEST
DL/VA % pred: 79 %
DL/VA: 3.8 ml/min/mmHg/L
DLCO UNC % PRED: 42 %
DLCO UNC: 10.25 ml/min/mmHg
FEF 25-75 Post: 1.38 L/sec
FEF 25-75 Pre: 1.03 L/sec
FEF2575-%Change-Post: 33 %
FEF2575-%Pred-Post: 75 %
FEF2575-%Pred-Pre: 56 %
FEV1-%CHANGE-POST: 6 %
FEV1-%PRED-PRE: 55 %
FEV1-%Pred-Post: 59 %
FEV1-Post: 1.14 L
FEV1-Pre: 1.07 L
FEV1FVC-%Change-Post: -6 %
FEV1FVC-%Pred-Pre: 105 %
FEV6-%CHANGE-POST: 13 %
FEV6-%PRED-POST: 62 %
FEV6-%PRED-PRE: 54 %
FEV6-PRE: 1.31 L
FEV6-Post: 1.49 L
FEV6FVC-%PRED-POST: 104 %
FEV6FVC-%PRED-PRE: 104 %
FVC-%Change-Post: 13 %
FVC-%PRED-POST: 59 %
FVC-%Pred-Pre: 52 %
FVC-PRE: 1.31 L
FVC-Post: 1.49 L
POST FEV1/FVC RATIO: 77 %
POST FEV6/FVC RATIO: 100 %
Pre FEV1/FVC ratio: 82 %
Pre FEV6/FVC Ratio: 100 %
RV % PRED: 118 %
RV: 2.51 L
TLC % PRED: 77 %
TLC: 3.94 L

## 2013-08-04 MED ORDER — ALBUTEROL SULFATE (2.5 MG/3ML) 0.083% IN NEBU
2.5000 mg | INHALATION_SOLUTION | Freq: Once | RESPIRATORY_TRACT | Status: AC
  Administered 2013-08-04: 2.5 mg via RESPIRATORY_TRACT

## 2013-08-05 ENCOUNTER — Other Ambulatory Visit: Payer: Self-pay | Admitting: *Deleted

## 2013-08-05 DIAGNOSIS — R0602 Shortness of breath: Secondary | ICD-10-CM

## 2013-10-05 ENCOUNTER — Ambulatory Visit (INDEPENDENT_AMBULATORY_CARE_PROVIDER_SITE_OTHER): Payer: 59 | Admitting: Cardiology

## 2013-10-05 ENCOUNTER — Encounter: Payer: Self-pay | Admitting: Cardiology

## 2013-10-05 VITALS — BP 130/80 | HR 92 | Ht 62.0 in | Wt 211.0 lb

## 2013-10-05 DIAGNOSIS — E785 Hyperlipidemia, unspecified: Secondary | ICD-10-CM

## 2013-10-05 DIAGNOSIS — I10 Essential (primary) hypertension: Secondary | ICD-10-CM

## 2013-10-05 DIAGNOSIS — I2789 Other specified pulmonary heart diseases: Secondary | ICD-10-CM

## 2013-10-05 DIAGNOSIS — R0602 Shortness of breath: Secondary | ICD-10-CM

## 2013-10-05 NOTE — Patient Instructions (Signed)

## 2013-10-05 NOTE — Progress Notes (Signed)
HPI: FU dyspnea. Patient has had previous repair of an ASD when she was 68 years old at [facility]. Chest x-ray in December of 2014 showed pulmonary vascular congestion and right lower lobe scar/atelectasis. Laboratories in December of 2014 showed a BNP of 73. Hemoglobin 11.9, BUN 12 and creatinine 0.57. Echocardiogram in January of 2015 in [place] showed normal LV function, abnormal diastolic function, moderate left atrial enlargement, moderate right ventricular enlargement, mild right atrial enlargement and moderately severe pulmonary hypertension. Laboratories in March of 2015 showed a sedimentation rate of 16,  normal rheumatoid factor and normal ANA titer. Pulmonary function tests in April of 2015 suggested an interstitial process. She was referred to pulmonary and is being followed in [place]. Pulmonary hypertension felt secondary to obstructive sleep apnea, scoliosis. Repeat echocardiogram with bubble to exclude residual shunt was also ordered but is pending. I increased her Lasix at last office visit. Since she was last seen, Her dyspnea on exertion has improved. No orthopnea, PND or chest pain. Pedal edema has improved.   Current Outpatient Prescriptions  Medication Sig Dispense Refill  . AMBULATORY NON FORMULARY MEDICATION Medication Name: Free Style InsuLinx test strips.  Use daily as directed  50 strip  12  . aspirin 81 MG tablet Take 81 mg by mouth daily.        . calcium-vitamin D (OSCAL WITH D) 500-200 MG-UNIT per tablet Take 1 tablet by mouth daily with breakfast.      . furosemide (LASIX) 40 MG tablet Take 1 tablet (40 mg total) by mouth daily.  30 tablet  12  . losartan (COZAAR) 25 MG tablet Take 1 tablet (25 mg total) by mouth daily.  90 tablet  2  . metFORMIN (GLUCOPHAGE) 500 MG tablet Take 500 mg by mouth daily with breakfast.      . Multiple Vitamin (MULTIVITAMIN) capsule Take 1 capsule by mouth daily.        Marland Kitchen omeprazole (PRILOSEC) 20 MG capsule Take 20 mg by  mouth daily.       . simvastatin (ZOCOR) 40 MG tablet Take 1 tablet (40 mg total) by mouth every evening.  30 tablet  6  . traMADol (ULTRAM) 50 MG tablet Take 1 tablet (50 mg total) by mouth every 8 (eight) hours as needed for moderate pain or severe pain.  45 tablet  0   No current facility-administered medications for this visit.     Past Medical History  Diagnosis Date  . Heart disease, congenital     large ASD, Sees Dr. Elmarie Shiley  . Foraminal stenosis of lumbar region     L4--L5  . 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 Scoliosis     severe  . Hypertension     pulmonary/on nighttime O2  . Pneumonia 2006  . Hyperlipidemia     dyslipidemia  . Hiatal hernia   . GERD (gastroesophageal reflux disease)   . CHF (congestive heart failure)     mild  . Spine misalignment     T spine 80% to right, L-Spine 55% to left  . Diabetes mellitus   . OSA (obstructive sleep apnea)     Past Surgical History  Procedure Laterality Date  . Cardiac surgery      repair of ASD  . Esophageal dilation  04-09    dilation/Dr. Inge Rise    History   Social History  . Marital Status: Married    Spouse Name: N/A  Number of Children: 2  . Years of Education: N/A   Occupational History  . Not on file.   Social History Main Topics  . Smoking status: Never Smoker   . Smokeless tobacco: Not on file  . Alcohol Use: No  . Drug Use:   . 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    Other Topics Concern  . Not on file   Social History Narrative  . No narrative on file    ROS: no fevers or chills, productive cough, hemoptysis, dysphasia, odynophagia, melena, hematochezia, dysuria, hematuria, rash, seizure activity, orthopnea, PND, pedal edema, claudication. Remaining systems are negative.  Physical Exam: Well-developed well-nourished in no acute distress.  Skin is warm and dry.  HEENT is  normal.  Neck is supple.  Chest is clear to auscultation with normal expansion.  Cardiovascular exam is regular rate and rhythm.  Abdominal exam nontender or distended. No masses palpated. Extremities show trace edema. neuro grossly intact

## 2013-10-05 NOTE — Assessment & Plan Note (Signed)
Continue statin. 

## 2013-10-05 NOTE — Assessment & Plan Note (Signed)
Patient was noted to have moderately severe pulmonary hypertension on recent echocardiogram. This is most likely related to obstructive sleep apnea and scoliosis. She has been seen by pulmonary in [place]. I will obtain those records and she will need followup. Continue CPAP. Her edema has improved with Lasix. We will continue at present dose. I will plan limited echocardiogram with bubble to exclude residual shunt from previous ASD repair.

## 2013-10-05 NOTE — Assessment & Plan Note (Signed)
Blood pressure controlled. Continue present medications. 

## 2013-10-12 ENCOUNTER — Encounter: Payer: Self-pay | Admitting: Family Medicine

## 2013-10-12 DIAGNOSIS — Z9989 Dependence on other enabling machines and devices: Secondary | ICD-10-CM

## 2013-10-12 DIAGNOSIS — G4733 Obstructive sleep apnea (adult) (pediatric): Secondary | ICD-10-CM | POA: Insufficient documentation

## 2013-10-26 ENCOUNTER — Ambulatory Visit (HOSPITAL_COMMUNITY): Payer: Medicare Other | Attending: Cardiology | Admitting: Cardiology

## 2013-10-26 ENCOUNTER — Encounter (HOSPITAL_COMMUNITY): Payer: Self-pay | Admitting: *Deleted

## 2013-10-26 DIAGNOSIS — E785 Hyperlipidemia, unspecified: Secondary | ICD-10-CM | POA: Insufficient documentation

## 2013-10-26 DIAGNOSIS — I2789 Other specified pulmonary heart diseases: Secondary | ICD-10-CM | POA: Insufficient documentation

## 2013-10-26 DIAGNOSIS — Q2111 Secundum atrial septal defect: Secondary | ICD-10-CM

## 2013-10-26 DIAGNOSIS — E119 Type 2 diabetes mellitus without complications: Secondary | ICD-10-CM | POA: Insufficient documentation

## 2013-10-26 DIAGNOSIS — I10 Essential (primary) hypertension: Secondary | ICD-10-CM | POA: Insufficient documentation

## 2013-10-26 DIAGNOSIS — Q211 Atrial septal defect: Secondary | ICD-10-CM

## 2013-10-26 DIAGNOSIS — R0602 Shortness of breath: Secondary | ICD-10-CM

## 2013-10-26 NOTE — Progress Notes (Signed)
Patient ID: Shelby Thomas, female   DOB: [birth year], 68 y.o.   MRN: [id number] 22G IV started in R hand x 1, site unremarkable, for Bubble Study. [name], RN

## 2013-10-26 NOTE — Progress Notes (Signed)
Limited bubble study performed.

## 2013-11-11 ENCOUNTER — Ambulatory Visit: Payer: 59 | Admitting: Family Medicine

## 2013-11-28 ENCOUNTER — Ambulatory Visit: Payer: Medicare Other | Admitting: Family Medicine

## 2013-11-30 ENCOUNTER — Ambulatory Visit (INDEPENDENT_AMBULATORY_CARE_PROVIDER_SITE_OTHER): Payer: Medicare Other | Admitting: Family Medicine

## 2013-11-30 ENCOUNTER — Encounter: Payer: Self-pay | Admitting: Family Medicine

## 2013-11-30 VITALS — BP 101/58 | HR 85 | Wt 201.0 lb

## 2013-11-30 DIAGNOSIS — G4733 Obstructive sleep apnea (adult) (pediatric): Secondary | ICD-10-CM

## 2013-11-30 DIAGNOSIS — I10 Essential (primary) hypertension: Secondary | ICD-10-CM

## 2013-11-30 DIAGNOSIS — M1710 Unilateral primary osteoarthritis, unspecified knee: Secondary | ICD-10-CM

## 2013-11-30 DIAGNOSIS — E669 Obesity, unspecified: Secondary | ICD-10-CM

## 2013-11-30 DIAGNOSIS — Z01818 Encounter for other preprocedural examination: Secondary | ICD-10-CM

## 2013-11-30 DIAGNOSIS — E119 Type 2 diabetes mellitus without complications: Secondary | ICD-10-CM

## 2013-11-30 DIAGNOSIS — M170 Bilateral primary osteoarthritis of knee: Secondary | ICD-10-CM

## 2013-11-30 DIAGNOSIS — Z9989 Dependence on other enabling machines and devices: Secondary | ICD-10-CM

## 2013-11-30 DIAGNOSIS — E1169 Type 2 diabetes mellitus with other specified complication: Secondary | ICD-10-CM

## 2013-11-30 LAB — POCT GLYCOSYLATED HEMOGLOBIN (HGB A1C): Hemoglobin A1C: 6.6

## 2013-11-30 NOTE — Progress Notes (Signed)
Subjective:    Patient ID: Shelby Thomas, female    DOB: 09/08/1945, 68 y.o.   MRN: [id number]  HPI Here for preoperative clearance for left knee replacement. She's currently being seen at work at [employer]. Her surgery is scheduled for next week. She did see her cardiologist, Dr. Olga Millers approximately 4 weeks ago and did have an echocardiogram with a negative bubble study. She has a history of an atrial septal defect repair. Please see additional medical history.  Patient Active Problem List   Diagnosis Date Noted  . OSA on CPAP 10/12/2013  . Cough with sputum 02/24/2013  . PVD (peripheral vascular disease) 03/31/2012  . Obese 07/03/2011  . Diabetes mellitus type 2 in obese 11/08/2010  . ARTHRITIS, CERVICAL SPINE 05/08/2009  . PVD 03/02/2008  . Osteoarthritis of both knees 03/02/2008  . Lumbar radiculopathy 12/29/2007  . ANKLE EDEMA 12/29/2007  . ESOPHAGEAL STRICTURE 08/18/2007  . GERD 07/19/2007  . HAIR LOSS 04/19/2007  . Osteoarthritis of left hip 04/19/2007  . PULMONARY HYPERTENSION, SEVERE 12/17/2006  . SCOLIOSIS NEC 12/17/2006  . VALVULAR HEART DISEASE 08/14/2006  . HYPERLIPIDEMIA 04/10/2006  . HYPERTENSION, BENIGN ESSENTIAL 04/02/2006    Diabetes - no hypoglycemic events. No wounds or sores that are not healing well. No increased thirst or urination. Checking glucose at home. Taking medications as prescribed without any side effects.  Obstructive sleep apnea-she says she uses her CPAP every night. She says she's very consistent about it and so she does feel better when she uses it.  Review of Systems     There were no vitals taken for this visit.    Allergies  Allergen Reactions  . Ramipril     REACTION: cough    Past Medical History  Diagnosis Date  . Heart disease, congenital     large ASD, Sees Dr. Elmarie Shiley  . Foraminal stenosis of lumbar region     L4--L5  . 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 Scoliosis     severe  .  Hypertension     pulmonary/on nighttime O2  . Pneumonia 2006  . Hyperlipidemia     dyslipidemia  . Hiatal hernia   . GERD (gastroesophageal reflux disease)   . CHF (congestive heart failure)     mild  . Spine misalignment     T spine 80% to right, L-Spine 55% to left  . Diabetes mellitus   . OSA (obstructive sleep apnea)     Past Surgical History  Procedure Laterality Date  . Cardiac surgery      repair of ASD  . Esophageal dilation  04-09    dilation/Dr. Inge Rise    History   Social History  . Marital Status: Married    Spouse Name: N/A    Number of Children: 2  . Years of Education: N/A   Occupational History  . Not on file.   Social History Main Topics  . Smoking status: Never Smoker   . Smokeless tobacco: Not on file  . Alcohol Use: No  . Drug Use:   . 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    Other Topics Concern  . Not on file   Social History Narrative  . No narrative on file    Family History  Problem Relation Age of Onset  . Diabetes Mother   . Stroke Mother   . CAD Mother  CABG in her 2470s    Outpatient Encounter Prescriptions as of 11/30/2013  Medication Sig  . AMBULATORY NON FORMULARY MEDICATION Medication Name: Free Style InsuLinx test strips.  Use daily as directed  . aspirin 81 MG tablet Take 81 mg by mouth daily.    . calcium-vitamin D (OSCAL WITH D) 500-200 MG-UNIT per tablet Take 1 tablet by mouth daily with breakfast.  . furosemide (LASIX) 40 MG tablet Take 1 tablet (40 mg total) by mouth daily.  Marland Kitchen. losartan (COZAAR) 25 MG tablet Take 1 tablet (25 mg total) by mouth daily.  . metFORMIN (GLUCOPHAGE) 500 MG tablet Take 500 mg by mouth daily with breakfast.  . Multiple Vitamin (MULTIVITAMIN) capsule Take 1 capsule by mouth daily.    Marland Kitchen. omeprazole (PRILOSEC) 20 MG capsule Take 20 mg by mouth daily.   . simvastatin (ZOCOR) 40 MG tablet Take 1 tablet  (40 mg total) by mouth every evening.  . traMADol (ULTRAM) 50 MG tablet Take 1 tablet (50 mg total) by mouth every 8 (eight) hours as needed for moderate pain or severe pain.       Objective:   Physical Exam  Constitutional: She is oriented to person, place, and time. She appears well-developed and well-nourished.  HENT:  Head: Normocephalic and atraumatic.  Right Ear: External ear normal.  Left Ear: External ear normal.  Nose: Nose normal.  Mouth/Throat: Oropharynx is clear and moist.  TMs and canals are clear.   Eyes: Conjunctivae and EOM are normal. Pupils are equal, round, and reactive to light.  Neck: Neck supple. No thyromegaly present.  Cardiovascular: Normal rate and regular rhythm.   Split S1. No carotid or abdominal bruits.   Pulmonary/Chest: Effort normal and breath sounds normal. She has no wheezes.  Abdominal: Soft. Bowel sounds are normal. She exhibits no distension and no mass. There is no tenderness. There is no rebound and no guarding.  Musculoskeletal: She exhibits no edema.  Lymphadenopathy:    She has no cervical adenopathy.  Neurological: She is alert and oriented to person, place, and time.  Skin: Skin is warm and dry.  Psychiatric: She has a normal mood and affect. Her behavior is normal.          Assessment & Plan:  Preoperative clearance-Ok for surgery.  She had a recent good checkup with her cardiologist about 4 weeks ago with a fairly normal echocardiogram. She does have history atrial septal defect repair. She is cleared for surgery next week for left knee replacement.  Ok to hold Aspirin 5 days prior to surgery. She is also to stop her fish oil 5 days prior. Please see attached EKG which is unchanged from previous. She has right bundle branch block and left anterior fascicular block.  Diabetes- A1 c was 6.6 today.  Well controlled.  Continue current regimen. Followup in 3 months. Reminded her to get her diabetic eye exam.  OSA- Using every night.  Continue to stay consistent with it. Call if any problems.   HTN- well controlled.  OK to decrease lasix to every other day.  Go back ot daily if swelling recurs.    GERD- takes prilosec daily.  Well controlled.   Encouraged her to think about getting the Prevnar 13. We can discuss again at her followup visit in 3 months.

## 2013-12-03 LAB — COMPLETE METABOLIC PANEL WITH GFR
ALBUMIN: 3.8 g/dL (ref 3.5–5.2)
ALT: 12 U/L (ref 0–35)
AST: 14 U/L (ref 0–37)
Alkaline Phosphatase: 63 U/L (ref 39–117)
BUN: 7 mg/dL (ref 6–23)
CALCIUM: 9.7 mg/dL (ref 8.4–10.5)
CHLORIDE: 100 meq/L (ref 96–112)
CO2: 32 meq/L (ref 19–32)
Creat: 0.62 mg/dL (ref 0.50–1.10)
GLUCOSE: 112 mg/dL — AB (ref 70–99)
POTASSIUM: 4.2 meq/L (ref 3.5–5.3)
SODIUM: 141 meq/L (ref 135–145)
TOTAL PROTEIN: 7.1 g/dL (ref 6.0–8.3)
Total Bilirubin: 0.5 mg/dL (ref 0.2–1.2)

## 2013-12-03 LAB — LIPID PANEL
Cholesterol: 157 mg/dL (ref 0–200)
HDL: 57 mg/dL (ref >39)
LDL CALC: 85 mg/dL (ref 0–99)
Total CHOL/HDL Ratio: 2.8 Ratio
Triglycerides: 74 mg/dL (ref <150)
VLDL: 15 mg/dL (ref 0–40)

## 2013-12-08 HISTORY — PX: REPLACEMENT TOTAL KNEE: SUR1224

## 2013-12-23 ENCOUNTER — Other Ambulatory Visit: Payer: Self-pay | Admitting: *Deleted

## 2013-12-23 MED ORDER — SIMVASTATIN 40 MG PO TABS: 40.0000 mg | ORAL_TABLET | Freq: Every evening | ORAL | Status: DC

## 2013-12-26 ENCOUNTER — Other Ambulatory Visit: Payer: Self-pay | Admitting: *Deleted

## 2013-12-26 MED ORDER — LOSARTAN POTASSIUM 25 MG PO TABS: 25.0000 mg | ORAL_TABLET | Freq: Every day | ORAL | Status: DC

## 2014-01-04 ENCOUNTER — Ambulatory Visit: Payer: 59 | Admitting: Sports Medicine

## 2014-01-16 ENCOUNTER — Other Ambulatory Visit: Payer: Self-pay | Admitting: *Deleted

## 2014-01-16 MED ORDER — METFORMIN HCL 500 MG PO TABS: 500.0000 mg | ORAL_TABLET | Freq: Every day | ORAL | Status: DC

## 2014-01-31 ENCOUNTER — Encounter: Payer: Self-pay | Admitting: Family Medicine

## 2014-02-01 DIAGNOSIS — M24662 Ankylosis, left knee: Secondary | ICD-10-CM | POA: Insufficient documentation

## 2014-02-02 ENCOUNTER — Ambulatory Visit (INDEPENDENT_AMBULATORY_CARE_PROVIDER_SITE_OTHER): Payer: Medicare Other | Admitting: Family Medicine

## 2014-02-02 ENCOUNTER — Encounter: Payer: Self-pay | Admitting: Family Medicine

## 2014-02-02 ENCOUNTER — Other Ambulatory Visit: Payer: Self-pay | Admitting: Family Medicine

## 2014-02-02 VITALS — BP 119/64 | HR 99 | Temp 97.9°F | Wt 188.0 lb

## 2014-02-02 DIAGNOSIS — R011 Cardiac murmur, unspecified: Secondary | ICD-10-CM

## 2014-02-02 DIAGNOSIS — R634 Abnormal weight loss: Secondary | ICD-10-CM

## 2014-02-02 DIAGNOSIS — R112 Nausea with vomiting, unspecified: Secondary | ICD-10-CM

## 2014-02-02 MED ORDER — ESOMEPRAZOLE MAGNESIUM 40 MG PO CPDR: 40.0000 mg | DELAYED_RELEASE_CAPSULE | Freq: Every day | ORAL | Status: DC

## 2014-02-02 NOTE — Assessment & Plan Note (Signed)
Discussed that the workup was  bloodwork. She's not having any abdominal pain but I still recommend checking liver enzymes and pancreatic enzymes as well as a CBC with differential and a sedimentation rate. We'll also check a TSH. Will refer to GI for endoscopy and further evaluation. We'll also discontinue her Prilosec and put her on Nexium 40 mg to try get her symptoms under better control. I'm very concerned about her 13 pound weight loss in such a short period time.

## 2014-02-02 NOTE — Progress Notes (Signed)
Subjective:    Patient ID: Shelby Thomas, female    DOB: [birth year], 68 y.o.   MRN: [id number]  HPI Says every since her knee surgery she has lost her appetite. She gets nauseated after eating. She has been taking her prilosec.  Does have GERD some. Denies food getting stuck but will occ vomit after small amount.  No known triggers like certain foods.  She has lost 13 lbs in the last 8 weeks.  No abdominal pain.  Stools have been more hard.  Occ vomits with liquids but rare.  No prior hx of GI ulcers.   Knee surgery was 12/08/13.   Review of Systems     BP 119/64  Pulse 99  Temp(Src) 97.9 F (36.6 C)  Wt 188 lb (85.276 kg)  SpO2 96%    Allergies  Allergen Reactions  . Ramipril     REACTION: cough    Past Medical History  Diagnosis Date  . Heart disease, congenital     large ASD, Sees Dr. Elmarie ShileyGoodfellow/card  . Foraminal stenosis of lumbar region     L4--L5  . 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 Scoliosis     severe  . Hypertension     pulmonary/on nighttime O2  . Pneumonia 2006  . Hyperlipidemia     dyslipidemia  . Hiatal hernia   . GERD (gastroesophageal reflux disease)   . CHF (congestive heart failure)     mild  . Spine misalignment     T spine 80% to right, L-Spine 55% to left  . Diabetes mellitus   . OSA (obstructive sleep apnea)     Past Surgical History  Procedure Laterality Date  . Cardiac surgery      repair of ASD  . Esophageal dilation  04-09    dilation/Dr. Inge Riseavid Wood  . Replacement total knee Left 12/08/13    Dr. Dossie Arbouralvin McCabe    History   Social History  . Marital Status: Married    Spouse Name: N/A    Number of Children: 2  . Years of Education: N/A   Occupational History  . Not on file.   Social History Main Topics  . Smoking status: Never Smoker   . Smokeless tobacco: Not on file  . Alcohol Use: No  . Drug Use:   . 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    Other Topics Concern  . Not on file   Social History Narrative  . No narrative on file    Family History  Problem Relation Age of Onset  . Diabetes Mother   . Stroke Mother   . CAD Mother     CABG in her 6570s    Outpatient Encounter Prescriptions as of 02/02/2014  Medication Sig  . AMBULATORY NON FORMULARY MEDICATION Medication Name: Free Style InsuLinx test strips.  Use daily as directed  . aspirin 81 MG tablet Take 81 mg by mouth daily.    . calcium-vitamin D (OSCAL WITH D) 500-200 MG-UNIT per tablet Take 1 tablet by mouth daily with breakfast.  . docusate sodium (COLACE) 100 MG capsule Take 100 mg by mouth.  Marland Kitchen. glucosamine-chondroitin (GLUCOSAMINE-CHONDROITIN DS) 500-400 MG tablet Take by mouth.  . losartan (COZAAR) 25 MG tablet Take 1 tablet (25 mg total) by mouth daily.  . metFORMIN (GLUCOPHAGE) 500 MG tablet Take 1 tablet (500 mg total) by mouth daily  with breakfast.  . Multiple Vitamin (MULTIVITAMIN) capsule Take 1 capsule by mouth daily.    Marland Kitchen oxyCODONE-acetaminophen (PERCOCET) 7.5-325 MG per tablet 1 to 2 tablets by mouth every 4 to 6 hours as needed for pain  . simvastatin (ZOCOR) 40 MG tablet Take 1 tablet (40 mg total) by mouth every evening.  . [DISCONTINUED] omeprazole (PRILOSEC) 20 MG capsule Take 20 mg by mouth daily.   Marland Kitchen esomeprazole (NEXIUM) 40 MG capsule Take 1 capsule (40 mg total) by mouth daily.  . furosemide (LASIX) 40 MG tablet Take 1 tablet (40 mg total) by mouth daily.  . traMADol (ULTRAM) 50 MG tablet Take 1 tablet (50 mg total) by mouth every 8 (eight) hours as needed for moderate pain or severe pain.       Objective:   Physical Exam  Constitutional: She is oriented to person, place, and time. She appears well-developed and well-nourished.  HENT:  Head: Normocephalic and atraumatic.  Neck: Neck supple. No thyromegaly present.  Cardiovascular: Normal rate and regular rhythm.   2/6SEM best heard at the left sternal border.    Pulmonary/Chest: Effort normal and breath sounds normal.  Abdominal: Soft. Bowel sounds are normal. She exhibits no distension and no mass. There is no tenderness. There is no rebound and no guarding.  Lymphadenopathy:    She has no cervical adenopathy.  Neurological: She is alert and oriented to person, place, and time.  Skin: Skin is warm and dry.  Psychiatric: She has a normal mood and affect. Her behavior is normal.          Assessment & Plan:  Nausea and vomiting after eating-was no abdominal pain this doesn't sound like cholecystitis. Also less likely to be pancreatitis or hepatitis but we'll check blood levels. Also check a CBC with differential to evaluate for infection. Though she has not had any fevers or chills. Consider severe reflux. So we'll discontinue omeprazole and start the Nexium instead. I'm very concerned about her rapid weight loss. Recommend referral to GI for endoscopy and further evaluation.

## 2014-02-03 LAB — CBC WITH DIFFERENTIAL/PLATELET
Basophils Absolute: 0 10*3/uL (ref 0.0–0.1)
Basophils Relative: 0 % (ref 0–1)
Eosinophils Absolute: 0.1 10*3/uL (ref 0.0–0.7)
Eosinophils Relative: 2 % (ref 0–5)
HEMATOCRIT: 34.8 % — AB (ref 36.0–46.0)
HEMOGLOBIN: 11.1 g/dL — AB (ref 12.0–15.0)
LYMPHS PCT: 23 % (ref 12–46)
Lymphs Abs: 1.6 10*3/uL (ref 0.7–4.0)
MCH: 29.9 pg (ref 26.0–34.0)
MCHC: 31.9 g/dL (ref 30.0–36.0)
MCV: 93.8 fL (ref 78.0–100.0)
MONOS PCT: 10 % (ref 3–12)
Monocytes Absolute: 0.7 10*3/uL (ref 0.1–1.0)
NEUTROS ABS: 4.4 10*3/uL (ref 1.7–7.7)
Neutrophils Relative %: 65 % (ref 43–77)
Platelets: 340 10*3/uL (ref 150–400)
RBC: 3.71 MIL/uL — ABNORMAL LOW (ref 3.87–5.11)
RDW: 14.9 % (ref 11.5–15.5)
WBC: 6.8 10*3/uL (ref 4.0–10.5)

## 2014-02-03 LAB — COMPLETE METABOLIC PANEL WITH GFR
ALK PHOS: 70 U/L (ref 39–117)
ALT: 9 U/L (ref 0–35)
AST: 20 U/L (ref 0–37)
Albumin: 3.4 g/dL — ABNORMAL LOW (ref 3.5–5.2)
BILIRUBIN TOTAL: 0.5 mg/dL (ref 0.2–1.2)
BUN: 10 mg/dL (ref 6–23)
CO2: 29 mEq/L (ref 19–32)
CREATININE: 0.74 mg/dL (ref 0.50–1.10)
Calcium: 8.8 mg/dL (ref 8.4–10.5)
Chloride: 103 mEq/L (ref 96–112)
GFR, Est African American: >89 mL/min
GFR, Est Non African American: 85 mL/min
Glucose, Bld: 147 mg/dL — ABNORMAL HIGH (ref 70–99)
Potassium: 4.1 mEq/L (ref 3.5–5.3)
Sodium: 137 mEq/L (ref 135–145)
Total Protein: 6.2 g/dL (ref 6.0–8.3)

## 2014-02-03 LAB — AMYLASE: AMYLASE: 32 U/L (ref 0–105)

## 2014-02-03 LAB — FOLATE: Folate: 17.2 ng/mL

## 2014-02-03 LAB — TSH: TSH: 1.282 u[IU]/mL (ref 0.350–4.500)

## 2014-02-03 LAB — FERRITIN: FERRITIN: 131 ng/mL (ref 10–291)

## 2014-02-03 LAB — LIPASE: LIPASE: 12 U/L (ref 0–75)

## 2014-02-03 LAB — VITAMIN B12: VITAMIN B 12: 1525 pg/mL — AB (ref 211–911)

## 2014-02-03 LAB — SEDIMENTATION RATE: Sed Rate: 32 mm/hr — ABNORMAL HIGH (ref 0–22)

## 2014-02-16 LAB — HM COLONOSCOPY

## 2014-03-02 ENCOUNTER — Ambulatory Visit (INDEPENDENT_AMBULATORY_CARE_PROVIDER_SITE_OTHER): Payer: Medicare Other | Admitting: Family Medicine

## 2014-03-02 ENCOUNTER — Encounter: Payer: Self-pay | Admitting: Family Medicine

## 2014-03-02 VITALS — BP 148/74 | HR 103 | Temp 98.6°F | Wt 179.0 lb

## 2014-03-02 DIAGNOSIS — Z23 Encounter for immunization: Secondary | ICD-10-CM

## 2014-03-02 DIAGNOSIS — E119 Type 2 diabetes mellitus without complications: Secondary | ICD-10-CM

## 2014-03-02 DIAGNOSIS — E1169 Type 2 diabetes mellitus with other specified complication: Secondary | ICD-10-CM

## 2014-03-02 DIAGNOSIS — Z0000 Encounter for general adult medical examination without abnormal findings: Secondary | ICD-10-CM

## 2014-03-02 DIAGNOSIS — E669 Obesity, unspecified: Secondary | ICD-10-CM

## 2014-03-02 LAB — POCT GLYCOSYLATED HEMOGLOBIN (HGB A1C): Hemoglobin A1C: 5.8

## 2014-03-02 NOTE — Progress Notes (Signed)
Subjective:    Shelby Thomas is a 68 y.o. female who presents for Medicare Annual/Subsequent preventive examination.  Preventive Screening-Counseling & Management  Tobacco History  Smoking status  . Never Smoker   Smokeless tobacco  . Not on file     Problems Prior to Visit 1. She was seen previously for persistent nausea and vomiting. She did see GI and had an endoscopy and a colonoscopy about a week ago. She was told the results were normal. That she does have a follow-up on Tuesday of next week. She's no longer vomiting but still has some nausea and decreased appetite and she has still lost weight since I last saw her.  Current Problems (verified) Patient Active Problem List   Diagnosis Date Noted  . Nausea with vomiting 02/02/2014  . OSA on CPAP 10/12/2013  . Cough with sputum 02/24/2013  . PVD (peripheral vascular disease) 03/31/2012  . Obese 07/03/2011  . Diabetes mellitus type 2 in obese 11/08/2010  . ARTHRITIS, CERVICAL SPINE 05/08/2009  . PVD 03/02/2008  . Osteoarthritis of both knees 03/02/2008  . Lumbar radiculopathy 12/29/2007  . ANKLE EDEMA 12/29/2007  . ESOPHAGEAL STRICTURE 08/18/2007  . GERD 07/19/2007  . HAIR LOSS 04/19/2007  . Osteoarthritis of left hip 04/19/2007  . PULMONARY HYPERTENSION, SEVERE 12/17/2006  . SCOLIOSIS NEC 12/17/2006  . VALVULAR HEART DISEASE 08/14/2006  . HYPERLIPIDEMIA 04/10/2006  . HYPERTENSION, BENIGN ESSENTIAL 04/02/2006    Medications Prior to Visit Current Outpatient Prescriptions on File Prior to Visit  Medication Sig Dispense Refill  . AMBULATORY NON FORMULARY MEDICATION Medication Name: Free Style InsuLinx test strips.  Use daily as directed  50 strip  12  . aspirin 81 MG tablet Take 81 mg by mouth daily.        . calcium-vitamin D (OSCAL WITH D) 500-200 MG-UNIT per tablet Take 1 tablet by mouth daily with breakfast.      . esomeprazole (NEXIUM) 40 MG capsule Take 1 capsule (40 mg total) by mouth daily.  30 capsule  3   . glucosamine-chondroitin (GLUCOSAMINE-CHONDROITIN DS) 500-400 MG tablet Take by mouth.      . losartan (COZAAR) 25 MG tablet Take 1 tablet (25 mg total) by mouth daily.  90 tablet  2  . metFORMIN (GLUCOPHAGE) 500 MG tablet Take 1 tablet (500 mg total) by mouth daily with breakfast.  30 tablet  1  . Multiple Vitamin (MULTIVITAMIN) capsule Take 1 capsule by mouth daily.        Marland Kitchen. oxyCODONE-acetaminophen (PERCOCET) 7.5-325 MG per tablet 1 to 2 tablets by mouth every 4 to 6 hours as needed for pain      . simvastatin (ZOCOR) 40 MG tablet Take 1 tablet (40 mg total) by mouth every evening.  30 tablet  6   No current facility-administered medications on file prior to visit.    Current Medications (verified) Current Outpatient Prescriptions  Medication Sig Dispense Refill  . AMBULATORY NON FORMULARY MEDICATION Medication Name: Free Style InsuLinx test strips.  Use daily as directed  50 strip  12  . aspirin 81 MG tablet Take 81 mg by mouth daily.        . calcium-vitamin D (OSCAL WITH D) 500-200 MG-UNIT per tablet Take 1 tablet by mouth daily with breakfast.      . esomeprazole (NEXIUM) 40 MG capsule Take 1 capsule (40 mg total) by mouth daily.  30 capsule  3  . glucosamine-chondroitin (GLUCOSAMINE-CHONDROITIN DS) 500-400 MG tablet Take by mouth.      .Marland Kitchen  losartan (COZAAR) 25 MG tablet Take 1 tablet (25 mg total) by mouth daily.  90 tablet  2  . metFORMIN (GLUCOPHAGE) 500 MG tablet Take 1 tablet (500 mg total) by mouth daily with breakfast.  30 tablet  1  . Multiple Vitamin (MULTIVITAMIN) capsule Take 1 capsule by mouth daily.        Marland Kitchen oxyCODONE-acetaminophen (PERCOCET) 7.5-325 MG per tablet 1 to 2 tablets by mouth every 4 to 6 hours as needed for pain      . simvastatin (ZOCOR) 40 MG tablet Take 1 tablet (40 mg total) by mouth every evening.  30 tablet  6   No current facility-administered medications for this visit.     Allergies (verified) Ramipril   PAST HISTORY  Family History Family  History  Problem Relation Age of Onset  . Diabetes Mother   . Stroke Mother   . CAD Mother     CABG in her 25s    Social History History  Substance Use Topics  . Smoking status: Never Smoker   . Smokeless tobacco: Not on file  . Alcohol Use: No     Are there smokers in your home (other than you)? No  Risk Factors Current exercise habits: The patient does not participate in regular exercise at present.  Dietary issues discussed: she is still not eating much. Dec appetite.    Cardiac risk factors: advanced age (older than 8 for men, 28 for women), diabetes mellitus, dyslipidemia and sedentary lifestyle.  Depression Screen (Note: if answer to either of the following is "Yes", a more complete depression screening is indicated)   Over the past two weeks, have you felt down, depressed or hopeless? No  Over the past two weeks, have you felt little interest or pleasure in doing things? No  Have you lost interest or pleasure in daily life? No  Do you often feel hopeless? No  Do you cry easily over simple problems? No  Activities of Daily Living In your present state of health, do you have any difficulty performing the following activities?:  Driving? No Managing money?  No Feeding yourself? No Getting from bed to chair? Yes  Climbing a flight of stairs? No Preparing food and eating?: No Bathing or showering? Yes Getting dressed: Yes Getting to the toilet? Yes Using the toilet:No Moving around from place to place: No In the past year have you fallen or had a near fall?:No   Are you sexually active?  No  Do you have more than one partner?  No  Hearing Difficulties: No Do you often ask people to speak up or repeat themselves? No Do you experience ringing or noises in your ears? No Do you have difficulty understanding soft or whispered voices? No   Do you feel that you have a problem with memory? No  Do you often misplace items? No  Do you feel safe at home?   Yes  Cognitive Testing  Alert? Yes  Normal Appearance?Yes  Oriented to person? Yes  Place? Yes   Time? Yes  Recall of three objects?  Yes  Can perform simple calculations? Yes  Displays appropriate judgment?Yes  Can read the correct time from a watch face?Yes   6CIT score of 0/28 (noraml)   Advanced Directives have been discussed with the patient? Yes  List the Names of Other Physician/Practitioners you currently use: 1.    Indicate any recent Medical Services you may have received from other than [facility] providers in the past year (date  may be approximate).  Immunization History  Administered Date(s) Administered  . Influenza Whole 04/02/2006, 04/19/2007  . Influenza, Seasonal, Injecte, Preservative Fre 03/31/2012  . Influenza,inj,Quad PF,36+ Mos 02/28/2013, 03/02/2014  . Pneumococcal Polysaccharide-23 04/02/2006, 07/01/2012  . Tdap 10/29/2010  . Zoster 12/23/2010    Screening Tests Health Maintenance  Topic Date Due  . Influenza Vaccine  12/03/2013  . Ophthalmology Exam  01/31/2014  . Urine Microalbumin  02/28/2014  . Foot Exam  04/21/2014  . Hemoglobin A1c  06/02/2014  . Mammogram  05/13/2015  . Colonoscopy  07/03/2020  . Tetanus/tdap  10/28/2020  . Pneumococcal Polysaccharide Vaccine Age 67 And Over  Completed  . Zostavax  Completed    All answers were reviewed with the patient and necessary referrals were made:  METHENEY,CATHERINE, MD   03/02/2014   History reviewed: allergies, current medications, past family history, past medical history, past social history, past surgical history and problem list  Review of Systems A comprehensive review of systems was negative.    Objective:     Vision by Snellen chart:See vison section  Body mass index is 32.73 kg/(m^2). BP 148/74  Pulse 103  Temp(Src) 98.6 F (37 C)  Wt 179 lb (81.194 kg)  BP 148/74  Pulse 103  Temp(Src) 98.6 F (37 C)  Wt 179 lb (81.194 kg) General appearance: alert, cooperative and  appears stated age Head: Normocephalic, without obvious abnormality, atraumatic Eyes: conj clear, EOMI Ears: normal TM's and external ear canals both ears Nose: Nares normal. Septum midline. Mucosa normal. No drainage or sinus tenderness. Throat: lips, mucosa, and tongue normal; teeth and gums normal Neck: no adenopathy, no carotid bruit, no JVD, supple, symmetrical, trachea midline and thyroid not enlarged, symmetric, no tenderness/mass/nodules Back: kyphosis Lungs: clear to auscultation bilaterally Heart: regular rate and rhythm, S1, S2 normal, no murmur, click, rub or gallop Abdomen: soft, non-tender; bowel sounds normal; no masses,  no organomegaly Extremities: extremities normal, atraumatic, no cyanosis or edema Pulses: 2+ and symmetric Skin: Skin color, texture, turgor normal. No rashes or lesions Lymph nodes: Cervical adenopathy: normal and Supraclavicular adenopathy: norma Neurologic: Alert and oriented X 3, normal strength and tone. Normal symmetric reflexes. Normal coordination and gait     Assessment:     Medicare Wellness EXam       Plan:     During the course of the visit the patient was educated and counseled about appropriate screening and preventive services including:    Pneumococcal vaccine   Influenza vaccine - we discussed Prevnar 13 today. She wants to think about it. Information provided.  Abnormal weight loss-appetite is still down so she is really still not taking in much by mouth. She does have a follow-up with GI next week. Depending on what they say we may consider further evaluation with CT the abdomen if she does not continue to improve. I did ask that she come back in 1 month for a weight check.    Diet review for nutrition referral? Yes ____  Not Indicated __X   Patient Instructions (the written plan) was given to the patient.  Medicare Attestation I have personally reviewed: The patient's medical and social history Their use of alcohol,  tobacco or illicit drugs Their current medications and supplements The patient's functional ability including ADLs,fall risks, home safety risks, cognitive, and hearing and visual impairment Diet and physical activities Evidence for depression or mood disorders  The patient's weight, height, BMI, and visual acuity have been recorded in the chart.  I have  made referrals, counseling, and provided education to the patient based on review of the above and I have provided the patient with a written personalized care plan for preventive services.     METHENEY,CATHERINE, MD   03/02/2014

## 2014-03-02 NOTE — Patient Instructions (Signed)
Keep up a regular exercise program and make sure you are eating a healthy diet Try to eat 4 servings of dairy a day, or if you are lactose intolerant take a calcium with vitamin D daily.  Your vaccines are up to date.   

## 2014-03-02 NOTE — Assessment & Plan Note (Signed)
Well controlled. Hemoglobin A1c looks fantastic today that most likely from her recent weight loss. We may need to adjust medications if she starts to have any hypoglycemic events. Otherwise follow-up in 3 months.

## 2014-05-19 ENCOUNTER — Other Ambulatory Visit: Payer: Self-pay | Admitting: *Deleted

## 2014-05-19 MED ORDER — METFORMIN HCL 500 MG PO TABS: ORAL_TABLET | ORAL | Status: DC

## 2014-06-19 ENCOUNTER — Other Ambulatory Visit: Payer: Self-pay

## 2014-06-19 MED ORDER — METFORMIN HCL 500 MG PO TABS: ORAL_TABLET | ORAL | Status: DC

## 2014-07-03 ENCOUNTER — Encounter: Payer: Self-pay | Admitting: Family Medicine

## 2014-07-03 ENCOUNTER — Ambulatory Visit (INDEPENDENT_AMBULATORY_CARE_PROVIDER_SITE_OTHER): Payer: Medicare Other | Admitting: Family Medicine

## 2014-07-03 VITALS — BP 134/69 | HR 91 | Wt 173.0 lb

## 2014-07-03 DIAGNOSIS — E119 Type 2 diabetes mellitus without complications: Secondary | ICD-10-CM

## 2014-07-03 DIAGNOSIS — E669 Obesity, unspecified: Secondary | ICD-10-CM

## 2014-07-03 DIAGNOSIS — I10 Essential (primary) hypertension: Secondary | ICD-10-CM

## 2014-07-03 DIAGNOSIS — M25562 Pain in left knee: Secondary | ICD-10-CM

## 2014-07-03 DIAGNOSIS — E1169 Type 2 diabetes mellitus with other specified complication: Secondary | ICD-10-CM

## 2014-07-03 DIAGNOSIS — R634 Abnormal weight loss: Secondary | ICD-10-CM

## 2014-07-03 LAB — POCT GLYCOSYLATED HEMOGLOBIN (HGB A1C): Hemoglobin A1C: 5.7

## 2014-07-03 MED ORDER — METFORMIN HCL 500 MG PO TABS: ORAL_TABLET | ORAL | Status: DC

## 2014-07-03 MED ORDER — SIMVASTATIN 40 MG PO TABS: 40.0000 mg | ORAL_TABLET | Freq: Every evening | ORAL | Status: DC

## 2014-07-03 NOTE — Patient Instructions (Signed)
can try tylenol Arthritis at night for you knee pain. If not helpful then try Tylenol arthritis.

## 2014-07-03 NOTE — Progress Notes (Signed)
   Subjective:    Patient ID: Shelby Thomas, female    DOB: December 02, 1945, 69 y.o.   MRN: [id number]  HPI Diabetes - no hypoglycemic events. No wounds or sores that are not healing well. No increased thirst or urination. Checking glucose at home. Taking medications as prescribed without any side effects. She has lost about 6 lbs.  She says her appetite is still not back to normal but she is eating much better than she was previously.  Hypertension- Pt denies chest pain, SOB, dizziness, or heart palpitations.  Taking meds as directed w/o problems.  Denies medication side effects.    S/p left knee  Replacement. Unfortunately she's had complications and has been getting some fibrosis around the  Replacement. Her surgeon has now referred her to a specialist in [place]. He has caused a significant problem with her gait. She still using a cane and is unable to completely extend the knee. He has been trying to go [facility] and do some stretches 6 etc. Says her pain has been bothering her more at night. She ould like seomthing mild to take as it has been affecting her sleep.     Review of Systems     Objective:   Physical Exam  Constitutional: She is oriented to person, place, and time. She appears well-developed and well-nourished.  HENT:  Head: Normocephalic and atraumatic.  Cardiovascular: Normal rate, regular rhythm and normal heart sounds.   Pulmonary/Chest: Effort normal and breath sounds normal.  Neurological: She is alert and oriented to person, place, and time.  Skin: Skin is warm and dry.  Psychiatric: She has a normal mood and affect. Her behavior is normal.          Assessment & Plan:  DM-  A1c looks fantastic today at 5.7. Well controlled. Continue current regimen. Follow-up in 4 months. On a statin, aspirin , ARB. Due for urine micro albumin an eye exam today.  HTN -  Well-controlled. Continue current regimen.   abnormal weight loss -continue to monitor carefully. She just  had a colonoscopy a few months ago that was fairly normal. She has also lost 6  more pounds but says her appetite has been getting better. We will monitor this over the next few months.  Left knee pain- reocmmend trial of tylenol arthritis. If not helpful then try Tylenol PM. If still not helping then please call back.

## 2014-08-29 ENCOUNTER — Telehealth: Payer: Self-pay | Admitting: Physician Assistant

## 2014-08-29 ENCOUNTER — Ambulatory Visit (INDEPENDENT_AMBULATORY_CARE_PROVIDER_SITE_OTHER): Payer: Medicare Other

## 2014-08-29 ENCOUNTER — Ambulatory Visit (INDEPENDENT_AMBULATORY_CARE_PROVIDER_SITE_OTHER): Payer: Medicare Other | Admitting: Physician Assistant

## 2014-08-29 ENCOUNTER — Encounter: Payer: Self-pay | Admitting: Physician Assistant

## 2014-08-29 VITALS — BP 145/75 | HR 95 | Temp 97.9°F | Ht 63.0 in | Wt 181.0 lb

## 2014-08-29 DIAGNOSIS — Z9989 Dependence on other enabling machines and devices: Secondary | ICD-10-CM

## 2014-08-29 DIAGNOSIS — I38 Endocarditis, valve unspecified: Secondary | ICD-10-CM

## 2014-08-29 DIAGNOSIS — M4135 Thoracogenic scoliosis, thoracolumbar region: Secondary | ICD-10-CM | POA: Diagnosis not present

## 2014-08-29 DIAGNOSIS — G4733 Obstructive sleep apnea (adult) (pediatric): Secondary | ICD-10-CM | POA: Diagnosis not present

## 2014-08-29 DIAGNOSIS — Z5181 Encounter for therapeutic drug level monitoring: Secondary | ICD-10-CM

## 2014-08-29 DIAGNOSIS — Z01818 Encounter for other preprocedural examination: Secondary | ICD-10-CM | POA: Diagnosis not present

## 2014-08-29 DIAGNOSIS — I270 Primary pulmonary hypertension: Secondary | ICD-10-CM | POA: Diagnosis not present

## 2014-08-29 DIAGNOSIS — I272 Pulmonary hypertension, unspecified: Secondary | ICD-10-CM

## 2014-08-29 DIAGNOSIS — I10 Essential (primary) hypertension: Secondary | ICD-10-CM

## 2014-08-29 LAB — POCT INR: INR: 1

## 2014-08-29 LAB — POCT URINALYSIS DIPSTICK
Bilirubin, UA: NEGATIVE
Blood, UA: NEGATIVE
GLUCOSE UA: NEGATIVE
Leukocytes, UA: NEGATIVE
Nitrite, UA: NEGATIVE
PROTEIN UA: NEGATIVE
SPEC GRAV UA: 1.025
UROBILINOGEN UA: 1
pH, UA: 6.5

## 2014-08-29 NOTE — Progress Notes (Addendum)
Subjective:    Patient ID: Shelby Thomas, female    DOB: Jan 19, 1946, 69 y.o.   MRN: [id number]  HPI Patient is a 69 year old female who presents to the clinic for presurgical clearance for left total knee replacement. She does have a history of valvular heart disease and pulmonary hypertension. She sees Dr. Jens Som for this. Her last visit with him was last June 2015. Patient declines any shortness of breath, palpitations, chest pains, cough or any other worsening issues. She continues to use CPAP at night for OSA. Patient has diabetes but last A1c was 5.7 in February 2016.  .. Active Ambulatory Problems    Diagnosis Date Noted  . HYPERLIPIDEMIA 04/10/2006  . HYPERTENSION, BENIGN ESSENTIAL 04/02/2006  . PULMONARY HYPERTENSION, SEVERE 12/17/2006  . VALVULAR HEART DISEASE 08/14/2006  . PVD 03/02/2008  . ESOPHAGEAL STRICTURE 08/18/2007  . GERD 07/19/2007  . HAIR LOSS 04/19/2007  . Osteoarthritis of left hip 04/19/2007  . Osteoarthritis of both knees 03/02/2008  . ARTHRITIS, CERVICAL SPINE 05/08/2009  . Lumbar radiculopathy 12/29/2007  . SCOLIOSIS NEC 12/17/2006  . ANKLE EDEMA 12/29/2007  . Diabetes mellitus type 2 in obese 11/08/2010  . Obese 07/03/2011  . PVD (peripheral vascular disease) 03/31/2012  . OSA on CPAP 10/12/2013  . Nausea with vomiting 02/02/2014   Resolved Ambulatory Problems    Diagnosis Date Noted  . Impaired fasting glucose 12/17/2006  . Cough with sputum 02/24/2013   Past Medical History  Diagnosis Date  . Heart disease, congenital   . Foraminal stenosis of lumbar region   . MVRCS association   . Scoliosis   . Hypertension   . Pneumonia 2006  . Hyperlipidemia   . Hiatal hernia   . GERD (gastroesophageal reflux disease)   . CHF (congestive heart failure)   . Spine misalignment   . Diabetes mellitus   . OSA (obstructive sleep apnea)    .Marland Kitchen Family History  Problem Relation Age of Onset  . Diabetes Mother   . Stroke Mother   . CAD Mother      CABG in her 34s   .Marland Kitchen History   Social History  . Marital Status: Married    Spouse Name: N/A  . Number of Children: 2  . Years of Education: N/A   Occupational History  . Not on file.   Social History Main Topics  . Smoking status: Never Smoker   . Smokeless tobacco: Not on file  . Alcohol Use: No  . Drug Use: Not on file  . 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    Other Topics Concern  . Not on file   Social History Narrative      Review of Systems  All other systems reviewed and are negative.      Objective:   Physical Exam  Constitutional: She is oriented to person, place, and time. She appears well-developed and well-nourished.  HENT:  Head: Normocephalic and atraumatic.  Cardiovascular: Normal rate and regular rhythm.   Split S1.  Pulmonary/Chest: Effort normal and breath sounds normal.  Neurological: She is alert and oriented to person, place, and time.  Skin: Skin is dry.  Psychiatric: She has a normal mood and affect. Her behavior is normal.          Assessment & Plan:  Pre-surgical clearance-we did get a checklist from [facility]. CBC, CMP, albumin was ordered today. MRSA nasal swab  done. PT/INR completed today. UA dipstick was done and only positive for ketones. No altered sensitivity done today. EKG done with some abnormalities but she does have a history of an abnormal EKG and cardiac hx. EKG showed some evidence of right and left block and LVH. Appears stable.  She will be following up with cardiology(Dr. Harrison Monsrenshaw)due to her cardiac conditions. Chest xray ordered today. Told to stop ASA and Fish oil 5 days before procedure.   Called Dr. Waunita Schoonercrenshaws office and could not get appt for patient. Will send note and see if he will review. If he needs to see her surgery may need to be pushed back.

## 2014-08-29 NOTE — Telephone Encounter (Signed)
Call pt: needs to follow up before surgery with Dr. Jens Somrenshaw for surgical clearance. Can we call crenshaw office and make sure to get her in before May 10th surgery?

## 2014-08-30 LAB — COMPLETE METABOLIC PANEL WITH GFR
ALBUMIN: 4.5 g/dL (ref 3.5–5.2)
ALK PHOS: 74 U/L (ref 39–117)
ALT: 13 U/L (ref 0–35)
AST: 21 U/L (ref 0–37)
BUN: 13 mg/dL (ref 6–23)
CHLORIDE: 101 meq/L (ref 96–112)
CO2: 32 mEq/L (ref 19–32)
Calcium: 10.4 mg/dL (ref 8.4–10.5)
Creat: 0.54 mg/dL (ref 0.50–1.10)
GFR, Est African American: >89 mL/min
GFR, Est Non African American: >89 mL/min
Glucose, Bld: 75 mg/dL (ref 70–99)
POTASSIUM: 4.4 meq/L (ref 3.5–5.3)
SODIUM: 142 meq/L (ref 135–145)
Total Bilirubin: 0.6 mg/dL (ref 0.2–1.2)
Total Protein: 7.6 g/dL (ref 6.0–8.3)

## 2014-08-30 LAB — CBC WITH DIFFERENTIAL/PLATELET
BASOS ABS: 0.1 10*3/uL (ref 0.0–0.1)
BASOS PCT: 1 % (ref 0–1)
Eosinophils Absolute: 0.1 10*3/uL (ref 0.0–0.7)
Eosinophils Relative: 2 % (ref 0–5)
HCT: 39.2 % (ref 36.0–46.0)
Hemoglobin: 12.6 g/dL (ref 12.0–15.0)
Lymphocytes Relative: 30 % (ref 12–46)
Lymphs Abs: 2 10*3/uL (ref 0.7–4.0)
MCH: 31.1 pg (ref 26.0–34.0)
MCHC: 32.1 g/dL (ref 30.0–36.0)
MCV: 96.8 fL (ref 78.0–100.0)
MPV: 9.5 fL (ref 8.6–12.4)
Monocytes Absolute: 0.5 10*3/uL (ref 0.1–1.0)
Monocytes Relative: 7 % (ref 3–12)
NEUTROS ABS: 4 10*3/uL (ref 1.7–7.7)
NEUTROS PCT: 60 % (ref 43–77)
PLATELETS: 328 10*3/uL (ref 150–400)
RBC: 4.05 MIL/uL (ref 3.87–5.11)
RDW: 13.8 % (ref 11.5–15.5)
WBC: 6.6 10*3/uL (ref 4.0–10.5)

## 2014-08-30 LAB — ALBUMIN: Albumin: 4.5 g/dL (ref 3.5–5.2)

## 2014-09-01 LAB — MRSA CULTURE

## 2014-09-01 NOTE — Telephone Encounter (Signed)
Shelby RubensteinJade spoke to Dr. Jens Somrenshaw & pt is going to see his PA.

## 2014-09-04 ENCOUNTER — Encounter: Payer: Self-pay | Admitting: *Deleted

## 2014-09-18 ENCOUNTER — Other Ambulatory Visit: Payer: Self-pay | Admitting: Family Medicine

## 2014-09-18 MED ORDER — LOSARTAN POTASSIUM 25 MG PO TABS: 25.0000 mg | ORAL_TABLET | Freq: Every day | ORAL | Status: DC

## 2014-09-20 ENCOUNTER — Ambulatory Visit (INDEPENDENT_AMBULATORY_CARE_PROVIDER_SITE_OTHER): Payer: Medicare Other | Admitting: Cardiology

## 2014-09-20 ENCOUNTER — Encounter: Payer: Self-pay | Admitting: Cardiology

## 2014-09-20 ENCOUNTER — Encounter: Payer: Self-pay | Admitting: *Deleted

## 2014-09-20 VITALS — BP 124/58 | HR 90 | Ht 63.0 in | Wt 185.0 lb

## 2014-09-20 DIAGNOSIS — I270 Primary pulmonary hypertension: Secondary | ICD-10-CM | POA: Diagnosis not present

## 2014-09-20 DIAGNOSIS — I10 Essential (primary) hypertension: Secondary | ICD-10-CM

## 2014-09-20 DIAGNOSIS — Z01810 Encounter for preprocedural cardiovascular examination: Secondary | ICD-10-CM | POA: Insufficient documentation

## 2014-09-20 DIAGNOSIS — I272 Pulmonary hypertension, unspecified: Secondary | ICD-10-CM

## 2014-09-20 NOTE — Assessment & Plan Note (Addendum)
Patient has risk factors including diabetes mellitus. Her functional capacity is difficult to assess as she has limited mobility from her hip arthralgias. Plan Nuclear study for risk stratification preoperatively. If negative for low risk she may proceed with hip replacement.

## 2014-09-20 NOTE — Progress Notes (Signed)
HPI: FU dyspnea. Patient has had previous repair of an ASD when she was 69 years old at [facility]. Echocardiogram in January of 2015 in [place] showed normal LV function, abnormal diastolic function, moderate left atrial enlargement, moderate right ventricular enlargement, mild right atrial enlargement and moderately severe pulmonary hypertension. Laboratories in March of 2015 showed a sedimentation rate of 16, normal rheumatoid factor and normal ANA titer. Pulmonary function tests in April of 2015 suggested an interstitial process. She was referred to pulmonary and is being followed in [place]. Pulmonary hypertension felt secondary to obstructive sleep apnea, scoliosis. Repeat echocardiogram 6/15 showed normal LV function, prior ASD repair and no residual shunt with negative saline microcavitation study. Since last seen, she denies increased dyspnea, chest pain, palpitations or syncope. Note she is scheduled to have hip replacement and has limited mobility. Therefore functional capacity cannot be assessed. We were asked to evaluate preoperatively.   Current Outpatient Prescriptions  Medication Sig Dispense Refill  . AMBULATORY NON FORMULARY MEDICATION Medication Name: Free Style InsuLinx test strips.  Use daily as directed 50 strip 12  . aspirin 81 MG tablet Take 81 mg by mouth daily.      . calcium-vitamin D (OSCAL WITH D) 500-200 MG-UNIT per tablet Take 1 tablet by mouth daily with breakfast.    . glucosamine-chondroitin (GLUCOSAMINE-CHONDROITIN DS) 500-400 MG tablet Take by mouth.    . losartan (COZAAR) 25 MG tablet Take 1 tablet (25 mg total) by mouth daily. 90 tablet 0  . metFORMIN (GLUCOPHAGE) 500 MG tablet Take 1 tabley by mouth daily with breakfast 30 tablet 6  . Multiple Vitamin (MULTIVITAMIN) capsule Take 1 capsule by mouth daily.      . simvastatin (ZOCOR) 40 MG tablet Take 1 tablet (40 mg total) by mouth every evening. 30 tablet 6   No current facility-administered  medications for this visit.     Past Medical History  Diagnosis Date  . Heart disease, congenital     large ASD, Sees Dr. Elmarie ShileyGoodfellow/card  . Foraminal stenosis of lumbar region     L4--L5  . 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 Scoliosis     severe  . Hypertension     pulmonary/on nighttime O2  . Pneumonia 2006  . Hyperlipidemia     dyslipidemia  . Hiatal hernia   . GERD (gastroesophageal reflux disease)   . CHF (congestive heart failure)     mild  . Spine misalignment     T spine 80% to right, L-Spine 55% to left  . Diabetes mellitus   . OSA (obstructive sleep apnea)     Past Surgical History  Procedure Laterality Date  . Cardiac surgery      repair of ASD  . Esophageal dilation  04-09    dilation/Dr. Inge Riseavid Wood  . Replacement total knee Left 12/08/13    Dr. Dossie Arbouralvin McCabe    History   Social History  . Marital Status: Married    Spouse Name: N/A  . Number of Children: 2  . Years of Education: N/A   Occupational History  . Not on file.   Social History Main Topics  . Smoking status: Never Smoker   . Smokeless tobacco: Not on file  . Alcohol Use: No  . Drug Use: Not on file  . 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  Other Topics Concern  . Not on file   Social History Narrative    ROS: no fevers or chills, productive cough, hemoptysis, dysphasia, odynophagia, melena, hematochezia, dysuria, hematuria, rash, seizure activity, orthopnea, PND, pedal edema, claudication. Remaining systems are negative.  Physical Exam: Well-developed well-nourished in no acute distress.  Skin is warm and dry.  HEENT is normal.  Neck is supple.  Chest is clear to auscultation with normal expansion.  Cardiovascular exam is regular rate and rhythm.  Abdominal exam nontender or distended. No masses palpated. Extremities show no edema. neuro grossly intact  ECG  08/29/2014-sinus rhythm, right bundle branch block, left anterior fascicular block, left ventricular hypertrophy.

## 2014-09-20 NOTE — Assessment & Plan Note (Signed)
Blood pressure controlled. Continue present medications. 

## 2014-09-20 NOTE — Patient Instructions (Signed)
Your physician wants you to follow-up in: ONE YEAR WITH DR CRENSHAW You will receive a reminder letter in the mail two months in advance. If you don't receive a letter, please call our office to schedule the follow-up appointment.   Your physician has requested that you have a lexiscan myoview. For further information please visit [email or web address]. Please follow instruction sheet, as given.   

## 2014-09-20 NOTE — Assessment & Plan Note (Signed)
Patient was noted to have moderately severe pulmonary hypertension on previous echocardiogram. This was felt secondary to obstructive sleep apnea and scoliosis. She has been seen by pulmonary in [place]. Continue CPAP. Note follow-up echocardiogram with microcavitation was negative for residual shunt following ASD repair.

## 2014-09-20 NOTE — Assessment & Plan Note (Signed)
Continue statin. 

## 2014-09-21 ENCOUNTER — Telehealth (HOSPITAL_COMMUNITY): Payer: Self-pay

## 2014-09-21 NOTE — Telephone Encounter (Signed)
Patient given detailed instructions per Myocardial Perfusion Study Information Sheet for test on 09-22-2014 at 8:45am. Patient verbalized understanding. Randa EvensEdwards, Tristain Daily A

## 2014-09-22 ENCOUNTER — Ambulatory Visit (HOSPITAL_COMMUNITY): Payer: Medicare Other | Attending: Cardiology

## 2014-09-22 VITALS — Ht 63.0 in | Wt 181.0 lb

## 2014-09-22 DIAGNOSIS — Q211 Atrial septal defect: Secondary | ICD-10-CM | POA: Insufficient documentation

## 2014-09-22 DIAGNOSIS — E119 Type 2 diabetes mellitus without complications: Secondary | ICD-10-CM | POA: Diagnosis not present

## 2014-09-22 DIAGNOSIS — I452 Bifascicular block: Secondary | ICD-10-CM | POA: Insufficient documentation

## 2014-09-22 DIAGNOSIS — Z01818 Encounter for other preprocedural examination: Secondary | ICD-10-CM | POA: Diagnosis not present

## 2014-09-22 DIAGNOSIS — R9439 Abnormal result of other cardiovascular function study: Secondary | ICD-10-CM | POA: Insufficient documentation

## 2014-09-22 DIAGNOSIS — I10 Essential (primary) hypertension: Secondary | ICD-10-CM | POA: Insufficient documentation

## 2014-09-22 DIAGNOSIS — I509 Heart failure, unspecified: Secondary | ICD-10-CM | POA: Diagnosis not present

## 2014-09-22 DIAGNOSIS — R110 Nausea: Secondary | ICD-10-CM

## 2014-09-22 LAB — MYOCARDIAL PERFUSION IMAGING
CHL CUP NUCLEAR SDS: 5
CHL CUP STRESS STAGE 2 GRADE: 0 %
CHL CUP STRESS STAGE 2 SPEED: 0 mph
CHL CUP STRESS STAGE 5 GRADE: 0 %
CHL CUP STRESS STAGE 5 HR: 99 {beats}/min
CHL CUP STRESS STAGE 5 SPEED: 0 mph
CHL CUP STRESS STAGE 6 GRADE: 0 %
CHL CUP STRESS STAGE 6 HR: 95 {beats}/min
CSEPEW: 1 METS
CSEPPMHR: 68 %
LVDIAVOL: 98 mL
LVSYSVOL: 46 mL
NUC STRESS EF: 53 %
Peak BP: 135 mmHg
Peak HR: 105 {beats}/min
RATE: 0.32
Rest HR: 78 {beats}/min
SRS: 12
SSS: 17
Stage 1 DBP: 75 mmHg
Stage 1 Grade: 0 %
Stage 1 HR: 76 {beats}/min
Stage 1 SBP: 150 mmHg
Stage 1 Speed: 0 mph
Stage 2 HR: 75 {beats}/min
Stage 3 Grade: 0 %
Stage 3 HR: 107 {beats}/min
Stage 3 Speed: 0 mph
Stage 4 DBP: 76 mmHg
Stage 4 Grade: 0 %
Stage 4 HR: 105 {beats}/min
Stage 4 SBP: 135 mmHg
Stage 4 Speed: 0 mph
Stage 5 DBP: 75 mmHg
Stage 5 SBP: 133 mmHg
Stage 6 Speed: 0 mph
TID: 0.92

## 2014-09-22 MED ORDER — TECHNETIUM TC 99M SESTAMIBI GENERIC - CARDIOLITE
11.0000 | Freq: Once | INTRAVENOUS | Status: AC | PRN
  Administered 2014-09-22: 11 via INTRAVENOUS

## 2014-09-22 MED ORDER — AMINOPHYLLINE 25 MG/ML IV SOLN
150.0000 mg | Freq: Two times a day (BID) | INTRAVENOUS | Status: AC | PRN
  Administered 2014-09-22: 150 mg via INTRAVENOUS

## 2014-09-22 MED ORDER — TECHNETIUM TC 99M SESTAMIBI GENERIC - CARDIOLITE
33.0000 | Freq: Once | INTRAVENOUS | Status: AC | PRN
Start: 2014-09-22 — End: 2014-09-22
  Administered 2014-09-22: 33 via INTRAVENOUS

## 2014-09-22 MED ORDER — REGADENOSON 0.4 MG/5ML IV SOLN
0.4000 mg | Freq: Once | INTRAVENOUS | Status: AC
  Administered 2014-09-22: 0.4 mg via INTRAVENOUS

## 2014-09-25 ENCOUNTER — Ambulatory Visit: Payer: Medicare Other | Admitting: Cardiology

## 2014-09-27 ENCOUNTER — Telehealth: Payer: Self-pay | Admitting: *Deleted

## 2014-09-27 NOTE — Telephone Encounter (Signed)
SIGNED CARDIAC CLEARANCE FOR LEFT HIP SURGERY FAXED TO [facility] [phone].

## 2014-10-30 ENCOUNTER — Telehealth: Payer: Self-pay | Admitting: *Deleted

## 2014-10-30 NOTE — Telephone Encounter (Signed)
Caryn BeeKevin, PT, from [facility] left vm stating that he just finished the pt's eval and wanted verbal orders for PT 2-3x a week for 6 weeks.  Focusing on strength, balance, & rom. Please advise.

## 2014-10-30 NOTE — Telephone Encounter (Signed)
Tell him he's got it!

## 2014-10-31 ENCOUNTER — Telehealth: Payer: Self-pay | Admitting: *Deleted

## 2014-10-31 NOTE — Telephone Encounter (Signed)
Left detailed vm notifying Caryn BeeKevin of VO for physical therapy.

## 2014-10-31 NOTE — Telephone Encounter (Signed)
Called and lvm get a VO for admission to home health nursing and physical therapy.. Called back and gave VO for this. Asked that she rtn call if any questions.Loralee PacasBarkley, Gerarda Conklin [place]

## 2014-11-01 ENCOUNTER — Ambulatory Visit: Payer: Medicare Other | Admitting: Family Medicine

## 2014-11-09 ENCOUNTER — Telehealth: Payer: Self-pay | Admitting: *Deleted

## 2014-11-09 NOTE — Telephone Encounter (Signed)
Called and lvm giving VO for continued occupational therapy.Marland Kitchen.Marland Kitchen.Loralee PacasBarkley, Aerika Groll [place]

## 2014-11-10 ENCOUNTER — Telehealth: Payer: Self-pay

## 2014-11-10 ENCOUNTER — Telehealth: Payer: Self-pay | Admitting: *Deleted

## 2014-11-10 MED ORDER — AMBULATORY NON FORMULARY MEDICATION: Status: DC

## 2014-11-10 NOTE — Telephone Encounter (Signed)
Called and left a message with Sheria LangCameron with [facility] requesting a call back to verify the fax number to send a rx for pt.

## 2014-11-10 NOTE — Telephone Encounter (Signed)
Called because pt will need order for DME equipment for the follwing: shower chair/tub bench. This should be faxed to: [phone] . rx printed and faxed.Shelby Thomas, Shelby Defino [place]

## 2014-11-17 ENCOUNTER — Ambulatory Visit (INDEPENDENT_AMBULATORY_CARE_PROVIDER_SITE_OTHER): Payer: Medicare Other | Admitting: Family Medicine

## 2014-11-17 ENCOUNTER — Encounter: Payer: Self-pay | Admitting: Family Medicine

## 2014-11-17 VITALS — BP 129/67 | HR 92 | Ht 63.0 in | Wt 181.0 lb

## 2014-11-17 DIAGNOSIS — Z23 Encounter for immunization: Secondary | ICD-10-CM | POA: Diagnosis not present

## 2014-11-17 DIAGNOSIS — D649 Anemia, unspecified: Secondary | ICD-10-CM

## 2014-11-17 DIAGNOSIS — E119 Type 2 diabetes mellitus without complications: Secondary | ICD-10-CM | POA: Diagnosis not present

## 2014-11-17 DIAGNOSIS — E669 Obesity, unspecified: Secondary | ICD-10-CM

## 2014-11-17 DIAGNOSIS — I10 Essential (primary) hypertension: Secondary | ICD-10-CM | POA: Diagnosis not present

## 2014-11-17 DIAGNOSIS — E1169 Type 2 diabetes mellitus with other specified complication: Secondary | ICD-10-CM

## 2014-11-17 LAB — POCT GLYCOSYLATED HEMOGLOBIN (HGB A1C): HEMOGLOBIN A1C: 5.7

## 2014-11-17 LAB — POCT HEMOGLOBIN: Hemoglobin: 9.7 g/dL — AB (ref 12.2–16.2)

## 2014-11-17 NOTE — Progress Notes (Signed)
   Subjective:    Patient ID: Shelby Thomas, female    DOB: [birth year], 69 y.o.   MRN: [id number]  HPI Diabetes - no hypoglycemic events. No wounds or sores that are not healing well. No increased thirst or urination. Checking glucose at home. Taking medications as prescribed without any side effects.  She recently had her hemoglobin checked through home health and it was a little bit low. She wanted to have that rechecked today. She had left hip replacement on May 31st. She hasn't had any blood in her stool . She is doing PT for her hip   Hypertension- Pt denies chest pain, SOB, dizziness, or heart palpitations.  Taking meds as directed w/o problems.  Denies medication side effects.      Review of Systems     Objective:   Physical Exam  Constitutional: She is oriented to person, place, and time. She appears well-developed and well-nourished.  HENT:  Head: Normocephalic and atraumatic.  Cardiovascular: Normal rate, regular rhythm and normal heart sounds.   Pulmonary/Chest: Effort normal and breath sounds normal.  Musculoskeletal: She exhibits no edema.  Neurological: She is alert and oriented to person, place, and time.  Skin: Skin is warm and dry.  Psychiatric: She has a normal mood and affect. Her behavior is normal.          Assessment & Plan:  DM- well controlled. Continue current regimen. Follow-up in 4 months. On ARB and statin.  Patient declined to give urine sample for microalbumin test because of her recent hip surgery. She's still in a wheelchair today. We will try to collect this at the next office visit. She did have an eye exam in May of this year. We'll call to try to get the report. Lab Results  Component Value Date   HGBA1C 5.7 11/17/2014   Low hemoglobin-fingerstick 11 was 9.7 today. Back in April it was 12.6.  Iron maker he nauseated.  Try Prenatal vitamin then recheck in 3 months.   HTN- well controlled. F/U in 4 months.  Due cmp and lipid panel.    Prevnar 13 given.

## 2014-11-28 ENCOUNTER — Encounter: Payer: Self-pay | Admitting: Family Medicine

## 2014-11-29 ENCOUNTER — Telehealth: Payer: Self-pay | Admitting: *Deleted

## 2014-11-29 NOTE — Telephone Encounter (Signed)
Caryn Bee from [facility] has been seeing the pt twice a week for PT.  He stated that the original order was to end next week but he would like a verbal order to extend this until 8/24.  He stated that he feels she needs a little more time.  Please advise.

## 2014-11-29 NOTE — Telephone Encounter (Signed)
Verbal order left on Caryn Bee (PT [facility]) voicemail. Callback information provided for any further questions.

## 2014-11-29 NOTE — Telephone Encounter (Signed)
Okay for verbal order to extend physical therapy.

## 2014-12-26 ENCOUNTER — Ambulatory Visit (INDEPENDENT_AMBULATORY_CARE_PROVIDER_SITE_OTHER): Payer: Medicare Other

## 2014-12-26 ENCOUNTER — Encounter: Payer: Self-pay | Admitting: Family Medicine

## 2014-12-26 ENCOUNTER — Ambulatory Visit (INDEPENDENT_AMBULATORY_CARE_PROVIDER_SITE_OTHER): Payer: Medicare Other | Admitting: Family Medicine

## 2014-12-26 VITALS — BP 102/65 | HR 112 | Wt 167.0 lb

## 2014-12-26 DIAGNOSIS — E119 Type 2 diabetes mellitus without complications: Secondary | ICD-10-CM

## 2014-12-26 DIAGNOSIS — R10811 Right upper quadrant abdominal tenderness: Secondary | ICD-10-CM

## 2014-12-26 DIAGNOSIS — E669 Obesity, unspecified: Secondary | ICD-10-CM | POA: Diagnosis not present

## 2014-12-26 DIAGNOSIS — E1169 Type 2 diabetes mellitus with other specified complication: Secondary | ICD-10-CM

## 2014-12-26 DIAGNOSIS — R112 Nausea with vomiting, unspecified: Secondary | ICD-10-CM

## 2014-12-26 LAB — POCT GLYCOSYLATED HEMOGLOBIN (HGB A1C): HEMOGLOBIN A1C: 5.7

## 2014-12-26 LAB — GLUCOSE, POCT (MANUAL RESULT ENTRY): POC GLUCOSE: 167 mg/dL — AB (ref 70–99)

## 2014-12-26 MED ORDER — PROMETHAZINE HCL 25 MG/ML IJ SOLN
25.0000 mg | Freq: Once | INTRAMUSCULAR | Status: AC
  Administered 2014-12-26: 25 mg via INTRAMUSCULAR

## 2014-12-26 MED ORDER — ONDANSETRON 4 MG PO TBDP: 4.0000 mg | ORAL_TABLET | Freq: Three times a day (TID) | ORAL | Status: DC | PRN

## 2014-12-26 NOTE — Addendum Note (Signed)
Addended by: Deno Etienne on: 12/26/2014 11:33 AM   Modules accepted: Orders

## 2014-12-26 NOTE — Progress Notes (Signed)
Subjective:    Patient ID: Shelby Thomas, female    DOB: Feb 07, 1946, 69 y.o.   MRN: [id number]  HPI  Nausea sxs began last thursday (6 days) no fever,chills,or diarrhea she has not been able to keep anything down she tried taking maalox this did not help. Tried maalox no relief. A1C was 5.7 today. No recent changes to her medications.  She is scheduled tomorrow for an MRI for her back. She was having back pain before the nausea started. She was started on tramadol and thinks that triggered her sxs but stopped it 4 days ago.  No constipated.  No dysphagia  Review of Systems No chest pain, shortness of breath or cough no sinus symptoms. No sore throat.  BP 102/65 mmHg  Pulse 112  Wt 167 lb (75.751 kg)  SpO2 96%    Allergies  Allergen Reactions  . Ramipril     REACTION: cough  . Tramadol Other (See Comments)    Nausea    Past Medical History  Diagnosis Date  . Heart disease, congenital     large ASD, Sees Dr. Elmarie Shiley  . Foraminal stenosis of lumbar region     L4--L5  . 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 Scoliosis     severe  . Hypertension     pulmonary/on nighttime O2  . Pneumonia 2006  . Hyperlipidemia     dyslipidemia  . Hiatal hernia   . GERD (gastroesophageal reflux disease)   . CHF (congestive heart failure)     mild  . Spine misalignment     T spine 80% to right, L-Spine 55% to left  . Diabetes mellitus   . OSA (obstructive sleep apnea)     Past Surgical History  Procedure Laterality Date  . Cardiac surgery      repair of ASD  . Esophageal dilation  04-09    dilation/Dr. Inge Rise  . Replacement total knee Left 12/08/13    Dr. Dossie Arbour  . Total hip arthroplasty Left     Social History   Social History  . Marital Status: Married    Spouse Name: N/A  . Number of Children: 2  . Years of Education: N/A   Occupational History  . Not on file.   Social History Main Topics  . Smoking status: Never Smoker   . Smokeless  tobacco: Not on file  . Alcohol Use: No  . Drug Use: Not on file  . 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    Other Topics Concern  . Not on file   Social History Narrative    Family History  Problem Relation Age of Onset  . Diabetes Mother   . Stroke Mother   . CAD Mother     CABG in her 5s    Outpatient Encounter Prescriptions as of 12/26/2014  Medication Sig  . AMBULATORY NON FORMULARY MEDICATION Medication Name: Free Style InsuLinx test strips.  Use daily as directed  . AMBULATORY NON FORMULARY MEDICATION Medication Name: Shower chair/tub bench  . aspirin 81 MG tablet Take 81 mg by mouth daily.    . calcium-vitamin D (OSCAL WITH D) 500-200 MG-UNIT per tablet Take 1 tablet by mouth daily with breakfast.  . glucosamine-chondroitin (GLUCOSAMINE-CHONDROITIN DS) 500-400 MG tablet Take by mouth.  . losartan (COZAAR) 25 MG tablet Take 1 tablet (25 mg total) by mouth  daily.  . metFORMIN (GLUCOPHAGE) 500 MG tablet Take 1 tabley by mouth daily with breakfast  . Multiple Vitamin (MULTIVITAMIN) capsule Take 1 capsule by mouth daily.    . simvastatin (ZOCOR) 40 MG tablet Take 1 tablet (40 mg total) by mouth every evening.   No facility-administered encounter medications on file as of 12/26/2014.          Objective:   Physical Exam  Constitutional: She is oriented to person, place, and time. She appears well-developed and well-nourished.  HENT:  Head: Normocephalic and atraumatic.  Cardiovascular: Normal rate, regular rhythm and normal heart sounds.   Pulmonary/Chest: Effort normal and breath sounds normal.  Abdominal: Soft. Bowel sounds are normal. She exhibits no distension and no mass. There is tenderness. There is no rebound.  Tender to palpation in the right upper quadrant. Unable to palpate liver edge. No hepatosplenomegaly.  Neurological: She is alert and oriented to person,  place, and time.  Skin: Skin is warm and dry.  Psychiatric: She has a normal mood and affect. Her behavior is normal.          Assessment & Plan:  Nausea with vomiting-unclear etiology at this point. No known sick contacts. No recent changes to medications etc. At this point she's had nausea with vomiting from 06 days which is atypical of Viral gastroenteritis. We'll do further workup with CBC and CMP. Evaluate pancreatic enzymes as well. On exam she does have some right upper quadrant tenderness. Next  Right upper quadrant tenderness-we'll move forward with gallbladder ultrasound. Also given a prescription for Zofran to use for nausea as needed.  Diabetes-did go ahead and repeat an A1c a little early today just to make sure that there was no sign that her blood sugars were out of control and this might be causing some dehydration with nausea. Her A1c looks absolutely fantastic.

## 2015-01-22 ENCOUNTER — Other Ambulatory Visit: Payer: Self-pay | Admitting: *Deleted

## 2015-01-22 MED ORDER — LOSARTAN POTASSIUM 25 MG PO TABS: 25.0000 mg | ORAL_TABLET | Freq: Every day | ORAL | Status: DC

## 2015-03-20 ENCOUNTER — Encounter: Payer: Self-pay | Admitting: Family Medicine

## 2015-03-20 ENCOUNTER — Other Ambulatory Visit: Payer: Self-pay | Admitting: *Deleted

## 2015-03-20 ENCOUNTER — Ambulatory Visit (INDEPENDENT_AMBULATORY_CARE_PROVIDER_SITE_OTHER): Payer: Medicare Other | Admitting: Family Medicine

## 2015-03-20 VITALS — BP 133/66 | HR 78 | Ht 63.0 in | Wt 166.0 lb

## 2015-03-20 DIAGNOSIS — E1169 Type 2 diabetes mellitus with other specified complication: Secondary | ICD-10-CM

## 2015-03-20 DIAGNOSIS — Z23 Encounter for immunization: Secondary | ICD-10-CM

## 2015-03-20 DIAGNOSIS — E669 Obesity, unspecified: Secondary | ICD-10-CM

## 2015-03-20 DIAGNOSIS — I10 Essential (primary) hypertension: Secondary | ICD-10-CM | POA: Diagnosis not present

## 2015-03-20 DIAGNOSIS — E119 Type 2 diabetes mellitus without complications: Secondary | ICD-10-CM

## 2015-03-20 DIAGNOSIS — Z1159 Encounter for screening for other viral diseases: Secondary | ICD-10-CM | POA: Diagnosis not present

## 2015-03-20 DIAGNOSIS — E785 Hyperlipidemia, unspecified: Secondary | ICD-10-CM

## 2015-03-20 LAB — COMPLETE METABOLIC PANEL WITH GFR
ALT: 10 U/L (ref 6–29)
AST: 17 U/L (ref 10–35)
Albumin: 3.8 g/dL (ref 3.6–5.1)
Alkaline Phosphatase: 62 U/L (ref 33–130)
BUN: 10 mg/dL (ref 7–25)
CHLORIDE: 104 mmol/L (ref 98–110)
CO2: 31 mmol/L (ref 20–31)
CREATININE: 0.64 mg/dL (ref 0.50–0.99)
Calcium: 9.4 mg/dL (ref 8.6–10.4)
GFR, Est Non African American: >89 mL/min (ref >=60)
Glucose, Bld: 124 mg/dL — ABNORMAL HIGH (ref 65–99)
POTASSIUM: 4.1 mmol/L (ref 3.5–5.3)
Sodium: 141 mmol/L (ref 135–146)
Total Bilirubin: 0.6 mg/dL (ref 0.2–1.2)
Total Protein: 6.7 g/dL (ref 6.1–8.1)

## 2015-03-20 LAB — HEPATITIS C ANTIBODY: HCV Ab: NEGATIVE

## 2015-03-20 LAB — POCT GLYCOSYLATED HEMOGLOBIN (HGB A1C): HEMOGLOBIN A1C: 5.8

## 2015-03-20 LAB — POCT UA - MICROALBUMIN
Albumin/Creatinine Ratio, Urine, POC: <30
Creatinine, POC: 100 mg/dL
Microalbumin Ur, POC: 10 mg/L

## 2015-03-20 LAB — LIPID PANEL
CHOL/HDL RATIO: 2.5 ratio (ref <=5.0)
Cholesterol: 177 mg/dL (ref 125–200)
HDL: 71 mg/dL (ref >=46)
LDL CALC: 92 mg/dL (ref <130)
TRIGLYCERIDES: 70 mg/dL (ref <150)
VLDL: 14 mg/dL (ref <30)

## 2015-03-20 MED ORDER — METFORMIN HCL 500 MG PO TABS: ORAL_TABLET | ORAL | Status: DC

## 2015-03-20 NOTE — Progress Notes (Signed)
   Subjective:    Patient ID: Shelby Thomas, female    DOB: [birth year], 69 y.o.   MRN: [id number]  HPI Diabetes - no hypoglycemic events. No wounds or sores that are not healing well. No increased thirst or urination. Checking glucose at home. Taking medications as prescribed without any side effects.  Hypertension- Pt denies chest pain, SOB, dizziness, or heart palpitations.  Taking meds as directed w/o problems.  Denies medication side effects.    She did well with her left hip replacement. Her pain is better but she is still not really walking.  It has been 6 months since her surgery.   Hyperlipidemia- no myaglias, etc. Tolerating simvastatin well.    Review of Systems     Objective:   Physical Exam  Constitutional: She is oriented to person, place, and time. She appears well-developed and well-nourished.  HENT:  Head: Normocephalic and atraumatic.  Cardiovascular: Normal rate, regular rhythm and normal heart sounds.   Pulmonary/Chest: Effort normal and breath sounds normal.  Neurological: She is alert and oriented to person, place, and time.  Skin: Skin is warm and dry.  Psychiatric: She has a normal mood and affect. Her behavior is normal.          Assessment & Plan:  DM- hemoglobin A1c is 5.8 which is absolutely fantastic. Continue current regimen. Follow-up in 4 months. Last eye exam in March/April.  Micro beaming collected today.  HTN- well controlled. Continue current regimen.   Hyperlipidemia-tolerating some a statin well without any side effects or palms. Due for repeat CMP and lipid panel.

## 2015-05-11 ENCOUNTER — Other Ambulatory Visit: Payer: Self-pay | Admitting: Family Medicine

## 2015-05-11 MED ORDER — SIMVASTATIN 40 MG PO TABS: 40.0000 mg | ORAL_TABLET | Freq: Every evening | ORAL | Status: DC

## 2015-05-11 MED ORDER — LOSARTAN POTASSIUM 25 MG PO TABS: 25.0000 mg | ORAL_TABLET | Freq: Every day | ORAL | Status: DC

## 2015-06-20 ENCOUNTER — Encounter: Payer: Self-pay | Admitting: Family Medicine

## 2015-06-20 ENCOUNTER — Ambulatory Visit (INDEPENDENT_AMBULATORY_CARE_PROVIDER_SITE_OTHER): Payer: Medicare Other | Admitting: Family Medicine

## 2015-06-20 VITALS — BP 127/65 | HR 81 | Wt 204.0 lb

## 2015-06-20 DIAGNOSIS — Z6836 Body mass index (BMI) 36.0-36.9, adult: Secondary | ICD-10-CM

## 2015-06-20 DIAGNOSIS — E669 Obesity, unspecified: Secondary | ICD-10-CM | POA: Diagnosis not present

## 2015-06-20 DIAGNOSIS — E119 Type 2 diabetes mellitus without complications: Secondary | ICD-10-CM | POA: Diagnosis not present

## 2015-06-20 DIAGNOSIS — I10 Essential (primary) hypertension: Secondary | ICD-10-CM

## 2015-06-20 DIAGNOSIS — Z1231 Encounter for screening mammogram for malignant neoplasm of breast: Secondary | ICD-10-CM

## 2015-06-20 DIAGNOSIS — E1169 Type 2 diabetes mellitus with other specified complication: Secondary | ICD-10-CM

## 2015-06-20 LAB — POCT GLYCOSYLATED HEMOGLOBIN (HGB A1C): HEMOGLOBIN A1C: 6

## 2015-06-20 NOTE — Progress Notes (Signed)
   Subjective:    Patient ID: Shelby Thomas, female    DOB: [birth year], 70 y.o.   MRN: [id number]  HPI Diabetes - no hypoglycemic events. No wounds or sores that are not healing well. No increased thirst or urination. Checking glucose at home. Taking medications as prescribed without any side effects.  Hypertension- Pt denies chest pain, SOB, dizziness, or heart palpitations.  Taking meds as directed w/o problems.  Denies medication side effects.    She has had poor healing and complications with her left hip replacement. Unfortunately that has left her with limited mobility. She is able to walk for short distance but is mostly relying on a wheelchair. She completed physical therapy over the summer. Her orthopedist is encouraged her to continue to work on increasing her activity level. She says in fact she know she has gained a lot of weight since she's been more sedentary and wants to try to go to the [facility] and see what programs they might offer that she might be able to purchase a patent to increase her exercise level.  Review of Systems     Objective:   Physical Exam  Constitutional: She is oriented to person, place, and time. She appears well-developed and well-nourished.  HENT:  Head: Normocephalic and atraumatic.  Cardiovascular: Normal rate, regular rhythm and normal heart sounds.   Pulmonary/Chest: Effort normal and breath sounds normal.  Neurological: She is alert and oriented to person, place, and time.  Skin: Skin is warm and dry.  Psychiatric: She has a normal mood and affect. Her behavior is normal.          Assessment & Plan:  DM- well controlled. A1C of 6.0, up  Alittle. Will call for eye exam from last year.  He says she thinks she is due for her next exam and we'll try to call and get that scheduled. She reports she went to an eye doctor [place] [address] in [place]. We will call to try to get that report.  HTN - well controlled.  Continue current  regimen.  Obesity/BMI 36 She has gain some weight. She plans on going to the [facility] today to see what programs they offer.  She may also qualify for PT again.  She may want to contact her insurance company. It may be very helpful in improving her mobility and gait since they are not going to be able to do another hip surgery on her.  Form completed for handicap placard.     She is okay with getting a mammogram but wants to schedule it at the same time as her next appointment in 3 months.

## 2015-06-22 ENCOUNTER — Other Ambulatory Visit: Payer: Self-pay | Admitting: *Deleted

## 2015-06-22 MED ORDER — SIMVASTATIN 40 MG PO TABS: 40.0000 mg | ORAL_TABLET | Freq: Every evening | ORAL | Status: DC

## 2015-09-07 NOTE — Progress Notes (Signed)
HPI: FU dyspnea. Patient has had previous repair of an ASD when she was 70 years old at [facility]. Echocardiogram in January of 2015 in [place] showed normal LV function, abnormal diastolic function, moderate left atrial enlargement, moderate right ventricular enlargement, mild right atrial enlargement and moderately severe pulmonary hypertension. Laboratories in March of 2015 showed a sedimentation rate of 16, normal rheumatoid factor and normal ANA titer. Pulmonary function tests in April of 2015 suggested an interstitial process. She was referred to pulmonary and is being followed in [place]. Pulmonary hypertension felt secondary to obstructive sleep apnea, scoliosis. Repeat echocardiogram 6/15 showed normal LV function, prior ASD repair and no residual shunt with negative saline microcavitation study. Nuclear study in May 2016 showed ejection fraction 53%. There was inferior lateral infarct but no ischemia. Since last seen, She denies dyspnea, chest pain, palpitations or syncope. Mild pedal edema.  Current Outpatient Prescriptions  Medication Sig Dispense Refill  . AMBULATORY NON FORMULARY MEDICATION Medication Name: Free Style InsuLinx test strips.  Use daily as directed 50 strip 12  . aspirin 81 MG tablet Take 81 mg by mouth daily.      . calcium-vitamin D (OSCAL WITH D) 500-200 MG-UNIT per tablet Take 1 tablet by mouth daily with breakfast.    . glucosamine-chondroitin (GLUCOSAMINE-CHONDROITIN DS) 500-400 MG tablet Take by mouth.    . losartan (COZAAR) 25 MG tablet Take 1 tablet (25 mg total) by mouth daily. 90 tablet 3  . metFORMIN (GLUCOPHAGE) 500 MG tablet Take 1 tabley by mouth daily with breakfast 30 tablet 6  . Multiple Vitamin (MULTIVITAMIN) capsule Take 1 capsule by mouth daily.      . simvastatin (ZOCOR) 40 MG tablet Take 1 tablet (40 mg total) by mouth every evening. 90 tablet 4   No current facility-administered medications for this visit.     Past Medical  History  Diagnosis Date  . Heart disease, congenital     large ASD, Sees Dr. Elmarie ShileyGoodfellow/card  . Foraminal stenosis of lumbar region     L4--L5  . 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 Scoliosis     severe  . Hypertension     pulmonary/on nighttime O2  . Pneumonia 2006  . Hyperlipidemia     dyslipidemia  . Hiatal hernia   . GERD (gastroesophageal reflux disease)   . CHF (congestive heart failure) (HCC)     mild  . Spine misalignment     T spine 80% to right, L-Spine 55% to left  . Diabetes mellitus (HCC)   . OSA (obstructive sleep apnea)     Past Surgical History  Procedure Laterality Date  . Cardiac surgery      repair of ASD  . Esophageal dilation  04-09    dilation/Dr. Inge Riseavid Wood  . Replacement total knee Left 12/08/13    Dr. Dossie Arbouralvin McCabe  . Total hip arthroplasty Left     Social History   Social History  . Marital Status: Married    Spouse Name: N/A  . Number of Children: 2  . Years of Education: N/A   Occupational History  . Not on file.   Social History Main Topics  . Smoking status: Never Smoker   . Smokeless tobacco: Not on file  . Alcohol Use: No  . Drug Use: Not on file  . 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    Other Topics Concern  . Not on file   Social History Narrative    Family History  Problem Relation Age of Onset  . Diabetes Mother   . Stroke Mother   . CAD Mother     CABG in her 8s    ROS: Hip and knee arthralgias but no fevers or chills, productive cough, hemoptysis, dysphasia, odynophagia, melena, hematochezia, dysuria, hematuria, rash, seizure activity, orthopnea, PND, claudication. Remaining systems are negative.  Physical Exam: Well-developed obese in no acute distress.  Skin is warm and dry.  HEENT is normal.  Neck is supple.  Chest is clear to auscultation with normal expansion.  Cardiovascular exam is regular  rate and rhythm.  Abdominal exam nontender or distended. No masses palpated. Extremities show trace edema. neuro grossly intact  ECG Sinus rhythm at a rate of 89. Left anterior fascicular block. Right bundle branch block. Left ventricular hypertrophy.

## 2015-09-12 ENCOUNTER — Ambulatory Visit (INDEPENDENT_AMBULATORY_CARE_PROVIDER_SITE_OTHER): Payer: Medicare Other | Admitting: Cardiology

## 2015-09-12 ENCOUNTER — Encounter: Payer: Self-pay | Admitting: Cardiology

## 2015-09-12 VITALS — BP 109/65 | HR 89 | Ht 63.0 in | Wt 209.8 lb

## 2015-09-12 DIAGNOSIS — E785 Hyperlipidemia, unspecified: Secondary | ICD-10-CM | POA: Diagnosis not present

## 2015-09-12 DIAGNOSIS — I272 Other secondary pulmonary hypertension: Secondary | ICD-10-CM | POA: Diagnosis not present

## 2015-09-12 NOTE — Assessment & Plan Note (Addendum)
Patient was noted to have moderately severe pulmonary hypertension on previous echocardiogram. This was felt secondary to obstructive sleep apnea and scoliosis per pulmonary. [place] pulmonary [place]. Continue CPAP and fu with them for further management. Note follow-up echocardiogram with microcavitation was negative for residual shunt following ASD repair.

## 2015-09-12 NOTE — Patient Instructions (Signed)
Your physician wants you to follow-up in: ONE YEAR WITH DR CRENSHAW You will receive a reminder letter in the mail two months in advance. If you don't receive a letter, please call our office to schedule the follow-up appointment.   If you need a refill on your cardiac medications before your next appointment, please call your pharmacy.  

## 2015-09-12 NOTE — Assessment & Plan Note (Signed)
Continue statin. 

## 2015-09-12 NOTE — Assessment & Plan Note (Signed)
Blood pressure controlled. Continue present medications. 

## 2015-09-19 ENCOUNTER — Ambulatory Visit (INDEPENDENT_AMBULATORY_CARE_PROVIDER_SITE_OTHER): Payer: Medicare Other | Admitting: Family Medicine

## 2015-09-19 ENCOUNTER — Encounter: Payer: Self-pay | Admitting: Family Medicine

## 2015-09-19 ENCOUNTER — Telehealth: Payer: Self-pay | Admitting: *Deleted

## 2015-09-19 ENCOUNTER — Ambulatory Visit: Payer: Medicare Other

## 2015-09-19 VITALS — BP 132/56 | HR 79 | Wt 209.0 lb

## 2015-09-19 DIAGNOSIS — E669 Obesity, unspecified: Secondary | ICD-10-CM | POA: Diagnosis not present

## 2015-09-19 DIAGNOSIS — I10 Essential (primary) hypertension: Secondary | ICD-10-CM

## 2015-09-19 DIAGNOSIS — Z9989 Dependence on other enabling machines and devices: Secondary | ICD-10-CM

## 2015-09-19 DIAGNOSIS — E119 Type 2 diabetes mellitus without complications: Secondary | ICD-10-CM

## 2015-09-19 DIAGNOSIS — G4733 Obstructive sleep apnea (adult) (pediatric): Secondary | ICD-10-CM | POA: Diagnosis not present

## 2015-09-19 DIAGNOSIS — E1169 Type 2 diabetes mellitus with other specified complication: Secondary | ICD-10-CM

## 2015-09-19 LAB — POCT GLYCOSYLATED HEMOGLOBIN (HGB A1C): HEMOGLOBIN A1C: 6.3

## 2015-09-19 NOTE — Telephone Encounter (Signed)
Called pt and lvm asking for rtn call about why she d/c'd Losartan. This med is to protect her kidneys. Wanted to know if it is causing any side effects. Pt asked to rtn call.Loralee PacasBarkley, Arlin Savona [place]

## 2015-09-19 NOTE — Progress Notes (Signed)
Subjective:    Patient ID: Shelby Thomas, female    DOB: 15-Jul-1945, 70 y.o.   MRN: [id number]  HPI Diabetes - no hypoglycemic events. No wounds or sores that are not healing well. No increased thirst or urination. Checking glucose at home. Taking medications as prescribed without any side effects.  She hasn't had her eye exam.   OSA - She says she wears her CPAP some but doesn't wear it every single night.  Hypertension- Pt denies chest pain, SOB, dizziness, or heart palpitations.  Taking meds as directed w/o problems.  Denies medication side effects.     Review of Systems  BP 132/56 mmHg  Pulse 79  Wt 209 lb (94.802 kg)    Allergies  Allergen Reactions  . Ramipril     REACTION: cough  . Tramadol Other (See Comments)    Nausea    Past Medical History  Diagnosis Date  . Heart disease, congenital     large ASD, Sees Dr. Elmarie Shiley  . Foraminal stenosis of lumbar region     L4--L5  . 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 Scoliosis     severe  . Hypertension     pulmonary/on nighttime O2  . Pneumonia 2006  . Hyperlipidemia     dyslipidemia  . Hiatal hernia   . GERD (gastroesophageal reflux disease)   . CHF (congestive heart failure) (HCC)     mild  . Spine misalignment     T spine 80% to right, L-Spine 55% to left  . Diabetes mellitus (HCC)   . OSA (obstructive sleep apnea)     Past Surgical History  Procedure Laterality Date  . Cardiac surgery      repair of ASD  . Esophageal dilation  04-09    dilation/Dr. Inge Rise  . Replacement total knee Left 12/08/13    Dr. Dossie Arbour  . Total hip arthroplasty Left     Social History   Social History  . Marital Status: Married    Spouse Name: N/A  . Number of Children: 2  . Years of Education: N/A   Occupational History  . Not on file.   Social History Main Topics  . Smoking status: Never Smoker   . Smokeless tobacco: Not on file  . Alcohol Use: No  . Drug Use: Not on file  . 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    Other Topics Concern  . Not on file   Social History Narrative    Family History  Problem Relation Age of Onset  . Diabetes Mother   . Stroke Mother   . CAD Mother     CABG in her 52s    Outpatient Encounter Prescriptions as of 09/19/2015  Medication Sig  . AMBULATORY NON FORMULARY MEDICATION Medication Name: Free Style InsuLinx test strips.  Use daily as directed  . aspirin 81 MG tablet Take 81 mg by mouth daily.    . calcium-vitamin D (OSCAL WITH D) 500-200 MG-UNIT per tablet Take 1 tablet by mouth daily with breakfast.  . glucosamine-chondroitin (GLUCOSAMINE-CHONDROITIN DS) 500-400 MG tablet Take by mouth.  . metFORMIN (GLUCOPHAGE) 500 MG tablet Take 1 tabley by mouth daily with breakfast  . Multiple Vitamin (MULTIVITAMIN) capsule Take 1 capsule by mouth daily.    . simvastatin (ZOCOR) 40 MG tablet Take 1 tablet (40 mg total) by mouth  every evening.  . [DISCONTINUED] losartan (COZAAR) 25 MG tablet Take 1 tablet (25 mg total) by mouth daily.   No facility-administered encounter medications on file as of 09/19/2015.          Objective:   Physical Exam  Constitutional: She is oriented to person, place, and time. She appears well-developed and well-nourished.  HENT:  Head: Normocephalic and atraumatic.  Cardiovascular: Normal rate, regular rhythm and normal heart sounds.   Pulmonary/Chest: Effort normal and breath sounds normal.  Neurological: She is alert and oriented to person, place, and time.  Skin: Skin is warm and dry.  Psychiatric: She has a normal mood and affect. Her behavior is normal.        Assessment & Plan:  DM- Well controlled. Continue current regimen. Follow up in 3-4 months. Reminded her to schedule her eye exam.  Lab Results  Component Value Date   HGBA1C 6.3 09/19/2015   Obstructive sleep apnea-reminded her the  importance of wearing her CPAP and to at least start the night out wearing it. She really tries any to get at least 4 hours each night if at all possible.  Hypertension-well controlled. She says she is not on the losartan.   She has her mammogram scheduled for today.

## 2015-09-20 ENCOUNTER — Other Ambulatory Visit: Payer: Self-pay | Admitting: *Deleted

## 2015-09-20 LAB — BASIC METABOLIC PANEL
BUN: 11 mg/dL (ref 7–25)
CO2: 26 mmol/L (ref 20–31)
Calcium: 9.4 mg/dL (ref 8.6–10.4)
Chloride: 103 mmol/L (ref 98–110)
Creat: 0.68 mg/dL (ref 0.50–0.99)
Glucose, Bld: 101 mg/dL — ABNORMAL HIGH (ref 65–99)
POTASSIUM: 4.1 mmol/L (ref 3.5–5.3)
Sodium: 140 mmol/L (ref 135–146)

## 2015-09-20 MED ORDER — METFORMIN HCL 500 MG PO TABS: ORAL_TABLET | ORAL | Status: DC

## 2015-09-20 NOTE — Telephone Encounter (Signed)
While speaking with Pt regarding lab results, questioned why Pt stopped her Losartan. Pt states she stopped the Rx because our office "didn't send a refill." Pt questions why she is on this Rx and if there is something wrong with her kidneys that she needs to be on something to protect them. Advised Pt I would be happy to send her a refill and tried to explain some regarding the Rx. Pt states she "wants to talk to [place] only about it." Will route.

## 2015-09-20 NOTE — Progress Notes (Signed)
Quick Note:  All labs are normal. ______ 

## 2015-10-12 ENCOUNTER — Ambulatory Visit: Payer: Medicare Other

## 2015-10-17 ENCOUNTER — Ambulatory Visit: Payer: Medicare Other

## 2015-10-19 NOTE — Telephone Encounter (Signed)
Pt stated that she is taking this med and does not require any refills.Shelby PacasBarkley, [name] [place]

## 2015-10-24 ENCOUNTER — Ambulatory Visit (INDEPENDENT_AMBULATORY_CARE_PROVIDER_SITE_OTHER): Payer: Medicare Other

## 2015-10-24 DIAGNOSIS — Z1231 Encounter for screening mammogram for malignant neoplasm of breast: Secondary | ICD-10-CM

## 2015-12-20 ENCOUNTER — Ambulatory Visit: Payer: Medicare Other | Admitting: Family Medicine

## 2015-12-24 ENCOUNTER — Ambulatory Visit (INDEPENDENT_AMBULATORY_CARE_PROVIDER_SITE_OTHER): Payer: Medicare Other | Admitting: Family Medicine

## 2015-12-24 ENCOUNTER — Encounter: Payer: Self-pay | Admitting: Family Medicine

## 2015-12-24 VITALS — BP 133/53 | HR 84 | Resp 16 | Wt 218.0 lb

## 2015-12-24 DIAGNOSIS — R600 Localized edema: Secondary | ICD-10-CM

## 2015-12-24 DIAGNOSIS — Z23 Encounter for immunization: Secondary | ICD-10-CM

## 2015-12-24 DIAGNOSIS — E119 Type 2 diabetes mellitus without complications: Secondary | ICD-10-CM

## 2015-12-24 DIAGNOSIS — E669 Obesity, unspecified: Secondary | ICD-10-CM

## 2015-12-24 DIAGNOSIS — I10 Essential (primary) hypertension: Secondary | ICD-10-CM | POA: Diagnosis not present

## 2015-12-24 DIAGNOSIS — E1169 Type 2 diabetes mellitus with other specified complication: Secondary | ICD-10-CM

## 2015-12-24 LAB — POCT GLYCOSYLATED HEMOGLOBIN (HGB A1C): HEMOGLOBIN A1C: 6.3

## 2015-12-24 MED ORDER — FUROSEMIDE 20 MG PO TABS: 20.0000 mg | ORAL_TABLET | Freq: Every day | ORAL | 0 refills | Status: DC | PRN

## 2015-12-24 NOTE — Progress Notes (Signed)
Subjective:    CC: DM  HPI: Diabetes - no hypoglycemic events. No wounds or sores that are not healing well. No increased thirst or urination. Checking glucose at home. Taking medications as prescribed without any side effects.Currently on metformin daily.  Hypertension- Pt denies chest pain, SOB, dizziness, or heart palpitations.  Taking meds as directed w/o problems.  Denies medication side effects.    She also comes in today complaining of lower extremity swelling. It's been chronic and daily. No real worsening or alleviating factors. She says she wakes up and is still swollen. It started about 2 months ago. She denies any pain but says it occasionally itches. No chest pain shortness of breath or wheezing. She has noticed that she feels full little earlier. No blood in the urine or stool. She denies any recent dietary or medication changes. She does not eat a lot of salt in her diet. She reports that she did have some swelling several years ago and took a diuretic for about a month and that seemed to resolve on its own.  Past medical history, Surgical history, Family history not pertinant except as noted below, Social history, Allergies, and medications have been entered into the medical record, reviewed, and corrections made.   Review of Systems: No fevers, chills, night sweats, weight loss, chest pain, or shortness of breath.   Objective:    General: Well Developed, well nourished, and in no acute distress.  Neuro: Alert and oriented x3, extra-ocular muscles intact, sensation grossly intact.  HEENT: Normocephalic, atraumatic  Skin: Warm and dry, no rashes. Cardiac: Regular rate and rhythm, no murmurs rubs or gallops, no lower extremity edema.  Respiratory: Clear to auscultation bilaterally. Not using accessory muscles, speaking in full sentences. Ext: She has 1+ pitting edema from about the mid tibia down through her feet. The skin is very thick and discolored. No break in the  skin.   Impression and Recommendations:   DM- Well controlled. Continue current regimen. Follow up in  3-4 months.  On a statin and ARB as well.    HTN- Hypertension- Pt denies chest pain, SOB, dizziness, or heart palpitations.  Taking meds as directed w/o problems.  Denies medication side effects.    LE edema - unclear etiology. We'll do additional workup including thyroid, BNP to rule out congestive heart failure that she's not having any chest or cardiac symptoms. A urinalysis to rule out proteinuria. We'll check a liver enzymes. Will call with results once available. Per the short-term we can use Lasix as needed and see if she responds to this. Recommend elevation. Avoid excessive salt.

## 2015-12-25 LAB — COMPLETE METABOLIC PANEL WITH GFR
ALT: 10 U/L (ref 6–29)
AST: 16 U/L (ref 10–35)
Albumin: 3.8 g/dL (ref 3.6–5.1)
Alkaline Phosphatase: 83 U/L (ref 33–130)
BILIRUBIN TOTAL: 0.4 mg/dL (ref 0.2–1.2)
BUN: 14 mg/dL (ref 7–25)
CALCIUM: 9.9 mg/dL (ref 8.6–10.4)
CHLORIDE: 103 mmol/L (ref 98–110)
CO2: 29 mmol/L (ref 20–31)
Creat: 0.76 mg/dL (ref 0.50–0.99)
GFR, Est Non African American: 81 mL/min (ref >=60)
Glucose, Bld: 109 mg/dL — ABNORMAL HIGH (ref 65–99)
Potassium: 4.1 mmol/L (ref 3.5–5.3)
Sodium: 141 mmol/L (ref 135–146)
TOTAL PROTEIN: 6.9 g/dL (ref 6.1–8.1)

## 2015-12-25 LAB — URINALYSIS
BILIRUBIN URINE: NEGATIVE
GLUCOSE, UA: NEGATIVE
Hgb urine dipstick: NEGATIVE
Ketones, ur: NEGATIVE
LEUKOCYTES UA: NEGATIVE
Nitrite: NEGATIVE
PH: 8 (ref 5.0–8.0)
Protein, ur: NEGATIVE
Specific Gravity, Urine: 1.02 (ref 1.001–1.035)

## 2015-12-25 LAB — TSH: TSH: 0.88 mIU/L

## 2015-12-25 LAB — BRAIN NATRIURETIC PEPTIDE: Brain Natriuretic Peptide: 28.3 pg/mL (ref <100)

## 2015-12-31 ENCOUNTER — Ambulatory Visit (INDEPENDENT_AMBULATORY_CARE_PROVIDER_SITE_OTHER): Payer: Medicare Other | Admitting: Family Medicine

## 2015-12-31 ENCOUNTER — Encounter: Payer: Self-pay | Admitting: Family Medicine

## 2015-12-31 VITALS — BP 130/59 | HR 85 | Wt 215.0 lb

## 2015-12-31 DIAGNOSIS — M7989 Other specified soft tissue disorders: Secondary | ICD-10-CM

## 2015-12-31 DIAGNOSIS — M79606 Pain in leg, unspecified: Secondary | ICD-10-CM | POA: Insufficient documentation

## 2015-12-31 DIAGNOSIS — M1612 Unilateral primary osteoarthritis, left hip: Secondary | ICD-10-CM | POA: Diagnosis not present

## 2015-12-31 LAB — BASIC METABOLIC PANEL WITH GFR
BUN: 11 mg/dL (ref 7–25)
CHLORIDE: 103 mmol/L (ref 98–110)
CO2: 28 mmol/L (ref 20–31)
Calcium: 9.4 mg/dL (ref 8.6–10.4)
Creat: 0.7 mg/dL (ref 0.50–0.99)
GFR, Est Non African American: 89 mL/min (ref >=60)
Glucose, Bld: 123 mg/dL — ABNORMAL HIGH (ref 65–99)
POTASSIUM: 4.3 mmol/L (ref 3.5–5.3)
SODIUM: 140 mmol/L (ref 135–146)

## 2015-12-31 MED ORDER — AMBULATORY NON FORMULARY MEDICATION
0 refills | Status: DC
Start: 2015-12-31 — End: 2017-11-25

## 2015-12-31 MED ORDER — POTASSIUM CHLORIDE ER 10 MEQ PO TBCR: EXTENDED_RELEASE_TABLET | ORAL | 2 refills | Status: DC

## 2015-12-31 NOTE — Progress Notes (Signed)
Subjective:    CC: F/U bilat leg swelling   HPI:  She came in a week ago for bilateral lower extremity swelling. We did do lab work to rule out thyroid disorder, liver failure, infection etc. No proteinuria. I did give her some when necessary Lasix use and recommended elevation. Her ankles look much better. She does have some compression stockings at home but did not wear them in today.  She is also requesting a letter for medicaid to get house especially bathroom handicap accessible.   Past medical history, Surgical history, Family history not pertinant except as noted below, Social history, Allergies, and medications have been entered into the medical record, reviewed, and corrections made.   Review of Systems: No fevers, chills, night sweats, weight loss, chest pain, or shortness of breath.   Objective:    General: Well Developed, well nourished, and in no acute distress.  Neuro: Alert and oriented x3, extra-ocular muscles intact, sensation grossly intact.  HEENT: Normocephalic, atraumatic  Skin: Warm and dry, no rashes. Cardiac: Regular rate and rhythm, no murmurs rubs or gallops, no lower extremity edema.  Respiratory: Clear to auscultation bilaterally. Not using accessory muscles, speaking in full sentences. Ext: Trace pitting edema lower extremities bilaterally.   Impression and Recommendations:   LE swelling - Weight down 3 lbs.  Will check a BMP to make sure potassium and kidney function looks okay. She did take the Lasix 5 out of the last 7 days. Says that she is using it more frequently she will likely need potassium supplementation with it. We also discussed that we have check lab work to make sure that even though her ankles look better that we are not over drying the kidney. We'll have to find a good happy medium on the diuretic. Also encouraged her to wear her compression stockings daily. Follow-up in 2-3 months.  Letter also provided her handicap accessible bathroom in her  apartment.

## 2016-01-01 NOTE — Progress Notes (Signed)
All labs are normal. 

## 2016-01-14 ENCOUNTER — Other Ambulatory Visit: Payer: Self-pay

## 2016-01-14 MED ORDER — SIMVASTATIN 40 MG PO TABS: 40.0000 mg | ORAL_TABLET | Freq: Every evening | ORAL | 0 refills | Status: DC

## 2016-01-24 LAB — HM DIABETES EYE EXAM

## 2016-02-21 ENCOUNTER — Other Ambulatory Visit: Payer: Self-pay | Admitting: Family Medicine

## 2016-03-03 ENCOUNTER — Ambulatory Visit (INDEPENDENT_AMBULATORY_CARE_PROVIDER_SITE_OTHER): Payer: Medicare Other | Admitting: Family Medicine

## 2016-03-03 ENCOUNTER — Encounter: Payer: Self-pay | Admitting: Family Medicine

## 2016-03-03 VITALS — BP 133/54 | HR 79 | Wt 215.0 lb

## 2016-03-03 DIAGNOSIS — M79606 Pain in leg, unspecified: Secondary | ICD-10-CM

## 2016-03-03 DIAGNOSIS — M7989 Other specified soft tissue disorders: Secondary | ICD-10-CM

## 2016-03-03 DIAGNOSIS — I10 Essential (primary) hypertension: Secondary | ICD-10-CM | POA: Diagnosis not present

## 2016-03-03 LAB — COMPLETE METABOLIC PANEL WITH GFR
ALBUMIN: 3.8 g/dL (ref 3.6–5.1)
ALK PHOS: 64 U/L (ref 33–130)
ALT: 8 U/L (ref 6–29)
AST: 14 U/L (ref 10–35)
BUN: 12 mg/dL (ref 7–25)
CALCIUM: 9.7 mg/dL (ref 8.6–10.4)
CHLORIDE: 104 mmol/L (ref 98–110)
CO2: 32 mmol/L — ABNORMAL HIGH (ref 20–31)
Creat: 0.65 mg/dL (ref 0.50–0.99)
GFR, Est African American: >89 mL/min (ref >=60)
Glucose, Bld: 118 mg/dL — ABNORMAL HIGH (ref 65–99)
POTASSIUM: 4.4 mmol/L (ref 3.5–5.3)
Sodium: 142 mmol/L (ref 135–146)
Total Bilirubin: 0.5 mg/dL (ref 0.2–1.2)
Total Protein: 6.7 g/dL (ref 6.1–8.1)

## 2016-03-03 LAB — LIPID PANEL
CHOL/HDL RATIO: 2.7 ratio (ref <=5.0)
CHOLESTEROL: 160 mg/dL (ref 125–200)
HDL: 59 mg/dL (ref >=46)
LDL Cholesterol: 84 mg/dL (ref <130)
Triglycerides: 87 mg/dL (ref <150)
VLDL: 17 mg/dL (ref <30)

## 2016-03-03 NOTE — Progress Notes (Signed)
Subjective:    CC: Follow-up lower extremity edema  HPI:  Lower extremity edema-she's doing very well on the furosemide. She said she takes it most days but is able to skip it about 2 days per week. She's been doing well without any cramps etc. She is on potassium supplement. No dizziness lightheadedness. No swelling in other extremities such as the hands.  Hypertension- Pt denies chest pain, SOB, dizziness, or heart palpitations.  Taking meds as directed w/o problems.  Denies medication side effects.      Past medical history, Surgical history, Family history not pertinant except as noted below, Social history, Allergies, and medications have been entered into the medical record, reviewed, and corrections made.   Review of Systems: No fevers, chills, night sweats, weight loss, chest pain, or shortness of breath.   Objective:    General: Well Developed, well nourished, and in no acute distress.  Neuro: Alert and oriented x3, extra-ocular muscles intact, sensation grossly intact.  HEENT: Normocephalic, atraumatic  Skin: Warm and dry, no rashes. Cardiac: Regular rate and rhythm, no murmurs rubs or gallops, trace lower extremity edema bilat.  Respiratory: Clear to auscultation bilaterally. Not using accessory muscles, speaking in full sentences.   Impression and Recommendations:   Lower extremity swelling-well-controlled on furosemide. Recheck kidney and potassium today. If everything looks great then can go back to checking labs every 6 months.   HTN - due for CMP and lipids. Well controlled.   Diabetes-we'll be due for follow-up technically next month but she is  so well controlled but outpatient out until January after the holidays and I'll see her back then.

## 2016-03-04 NOTE — Progress Notes (Signed)
All labs are normal. 

## 2016-04-22 DIAGNOSIS — Z961 Presence of intraocular lens: Secondary | ICD-10-CM | POA: Insufficient documentation

## 2016-04-24 ENCOUNTER — Other Ambulatory Visit: Payer: Self-pay | Admitting: Family Medicine

## 2016-05-12 ENCOUNTER — Other Ambulatory Visit: Payer: Self-pay | Admitting: Family Medicine

## 2016-06-03 ENCOUNTER — Ambulatory Visit: Payer: Medicare Other | Admitting: Family Medicine

## 2016-06-24 ENCOUNTER — Other Ambulatory Visit: Payer: Self-pay | Admitting: Family Medicine

## 2016-07-02 ENCOUNTER — Encounter: Payer: Self-pay | Admitting: Family Medicine

## 2016-07-02 ENCOUNTER — Ambulatory Visit (INDEPENDENT_AMBULATORY_CARE_PROVIDER_SITE_OTHER): Payer: Medicare Other | Admitting: Family Medicine

## 2016-07-02 ENCOUNTER — Ambulatory Visit (INDEPENDENT_AMBULATORY_CARE_PROVIDER_SITE_OTHER): Payer: Medicare Other

## 2016-07-02 VITALS — BP 123/46 | HR 88 | Ht 63.0 in | Wt 222.0 lb

## 2016-07-02 DIAGNOSIS — M25512 Pain in left shoulder: Secondary | ICD-10-CM | POA: Diagnosis not present

## 2016-07-02 DIAGNOSIS — M7582 Other shoulder lesions, left shoulder: Secondary | ICD-10-CM | POA: Diagnosis not present

## 2016-07-02 DIAGNOSIS — E1169 Type 2 diabetes mellitus with other specified complication: Secondary | ICD-10-CM

## 2016-07-02 DIAGNOSIS — E669 Obesity, unspecified: Secondary | ICD-10-CM

## 2016-07-02 DIAGNOSIS — R1314 Dysphagia, pharyngoesophageal phase: Secondary | ICD-10-CM

## 2016-07-02 LAB — POCT GLYCOSYLATED HEMOGLOBIN (HGB A1C): HEMOGLOBIN A1C: 7.2

## 2016-07-02 MED ORDER — OMEPRAZOLE 40 MG PO CPDR: 40.0000 mg | DELAYED_RELEASE_CAPSULE | Freq: Every day | ORAL | 3 refills | Status: DC

## 2016-07-02 NOTE — Progress Notes (Signed)
Subjective:    CC: DM  HPI: Diabetes - no hypoglycemic events. No wounds or sores that are not healing well. No increased thirst or urination. Checking glucose at home. Taking medications as prescribed without any side effects.  She also complains of left shoulder pain. Both are actually painful but the left is worse. She says she had bronchitis a couple of weeks ago and then all of a sudden last Friday suddenly started getting increasing pain in her left shoulder and decreased range of motion. She denies any known injury or trauma. The right is bothering her as well but not nearly as much as the left. She denies falling on it etc. She says she no she has arthritis in that shoulder. She's not really taking any medication for her discomfort.  She's also complains of increase in reflux symptoms. She says she's been taking lots of Tums because she feels like there is inflammation and burning in her esophagus and is actually getting the sensation that food is getting stuck in her lower chest. It's quite uncomfortable to the point that she has to quit eating. She has had an esophageal stricture in the past but it was much higher up and had that dilated.    Past medical history, Surgical history, Family history not pertinant except as noted below, Social history, Allergies, and medications have been entered into the medical record, reviewed, and corrections made.   Review of Systems: No fevers, chills, night sweats, weight loss, chest pain, or shortness of breath.   Objective:    General: Well Developed, well nourished, and in no acute distress.  Neuro: Alert and oriented x3, extra-ocular muscles intact, sensation grossly intact.  HEENT: Normocephalic, atraumatic  Skin: Warm and dry, no rashes. Cardiac: Regular rate and rhythm, no murmurs rubs or gallops, trace ankle bilat edema.  Respiratory: Clear to auscultation bilaterally. Not using accessory muscles, speaking in full sentences. MSK: She is  only able to extend her left shoulder to about 75. She is unable to reach across to her opposite shoulder and touch it. Her strength with internal and external rotation is good but she is unable to externally rotate the shoulder past 90.   Impression and Recommendations:   DM- Uncontrolled. Hemoglobin A1c up to 7.2, from previous of 6.3. We discussed getting back on track with diet and exercise. She admits she's been getting into a lot of candy especially over the holidays. Follow-up in 3 months. If not back at goal at that time then will adjust her medication regimen.  Dysphagia-we'll go ahead and start her on a PPI. If not improving before her GI appointment that encouraged her to keep that so that they can do an endoscopy and make sure that she does not have another esophageal stricture.  Frozen shoulder, left-will get x-ray today and get her in with sports medicine next week for an appointment for treatment.

## 2016-07-02 NOTE — Patient Instructions (Signed)
Start the omeprazole daily. Take about about 30 min before breakfast each day.  If you are feeling better we can always cancel your GI appt.

## 2016-07-07 ENCOUNTER — Ambulatory Visit (INDEPENDENT_AMBULATORY_CARE_PROVIDER_SITE_OTHER): Payer: Medicare Other | Admitting: Sports Medicine

## 2016-07-07 DIAGNOSIS — M19012 Primary osteoarthritis, left shoulder: Secondary | ICD-10-CM | POA: Diagnosis not present

## 2016-07-07 MED ORDER — CELECOXIB 200 MG PO CAPS: ORAL_CAPSULE | ORAL | 2 refills | Status: DC

## 2016-07-07 NOTE — Assessment & Plan Note (Signed)
Glenohumeral, acromial clavicular, subacromial spurring on x-ray. Currently for the most part pain-free today. I am going to add Celebrex and have her do some home rehabilitation exercises. She can return to see me on an as-needed basis. Pain was in the front predominantly at the joint line.

## 2016-07-07 NOTE — Progress Notes (Signed)
   Subjective:    I'm seeing this patient as a consultation for:  Dr. Nani Gasseratherine Metheney  CC: Left shoulder pain  HPI: For several weeks this pleasant 71 year old female had only minor pain but mostly stiffness that she localizes at the anterior aspect of her shoulder. As quickly as it began it is resolving, and she is essentially pain-free with range of motion returning today.  Past medical history:  Negative.  See flowsheet/record as well for more information.  Surgical history: Negative.  See flowsheet/record as well for more information.  Family history: Negative.  See flowsheet/record as well for more information.  Social history: Negative.  See flowsheet/record as well for more information.  Allergies, and medications have been entered into the medical record, reviewed, and no changes needed.   Review of Systems: No headache, visual changes, nausea, vomiting, diarrhea, constipation, dizziness, abdominal pain, skin rash, fevers, chills, night sweats, weight loss, swollen lymph nodes, body aches, joint swelling, muscle aches, chest pain, shortness of breath, mood changes, visual or auditory hallucinations.   Objective:   General: Well Developed, well nourished, and in no acute distress.  Neuro/Psych: Alert and oriented x3, extra-ocular muscles intact, able to move all 4 extremities, sensation grossly intact. Skin: Warm and dry, no rashes noted.  Respiratory: Not using accessory muscles, speaking in full sentences, trachea midline.  Cardiovascular: Pulses palpable, no extremity edema. Abdomen: Does not appear distended. Left Shoulder: Inspection reveals no abnormalities, atrophy or asymmetry. Palpation is normal with no tenderness over AC joint or bicipital groove. ROM is full in all planes. Rotator cuff strength normal throughout. No signs of impingement with negative Neer and Hawkin's tests, empty can. Speeds and Yergason's tests normal. No labral pathology noted with negative  Obrien's, negative crank, negative clunk, and good stability. Normal scapular function observed. No painful arc and no drop arm sign. No apprehension sign  Impression and Recommendations:   This case required medical decision making of moderate complexity.  Primary osteoarthritis of left shoulder Glenohumeral, acromial clavicular, subacromial spurring on x-ray. Currently for the most part pain-free today. I am going to add Celebrex and have her do some home rehabilitation exercises. She can return to see me on an as-needed basis. Pain was in the front predominantly at the joint line.

## 2016-07-17 ENCOUNTER — Other Ambulatory Visit: Payer: Self-pay | Admitting: *Deleted

## 2016-07-17 DIAGNOSIS — M79606 Pain in leg, unspecified: Secondary | ICD-10-CM

## 2016-07-17 DIAGNOSIS — M7989 Other specified soft tissue disorders: Principal | ICD-10-CM

## 2016-07-17 MED ORDER — SIMVASTATIN 40 MG PO TABS: 40.0000 mg | ORAL_TABLET | Freq: Every evening | ORAL | 3 refills | Status: DC

## 2016-07-17 MED ORDER — POTASSIUM CHLORIDE ER 10 MEQ PO TBCR: EXTENDED_RELEASE_TABLET | ORAL | 6 refills | Status: DC

## 2016-07-24 ENCOUNTER — Other Ambulatory Visit: Payer: Self-pay | Admitting: *Deleted

## 2016-07-24 MED ORDER — FUROSEMIDE 20 MG PO TABS: ORAL_TABLET | ORAL | 1 refills | Status: DC

## 2016-07-24 MED ORDER — LOSARTAN POTASSIUM 25 MG PO TABS: 25.0000 mg | ORAL_TABLET | Freq: Every day | ORAL | 1 refills | Status: DC

## 2016-09-23 ENCOUNTER — Ambulatory Visit (INDEPENDENT_AMBULATORY_CARE_PROVIDER_SITE_OTHER): Payer: Medicare Other

## 2016-09-23 ENCOUNTER — Ambulatory Visit (INDEPENDENT_AMBULATORY_CARE_PROVIDER_SITE_OTHER): Payer: Medicare Other | Admitting: Family Medicine

## 2016-09-23 ENCOUNTER — Ambulatory Visit: Payer: Medicare Other

## 2016-09-23 VITALS — BP 130/72 | HR 81 | Ht 63.0 in | Wt 221.0 lb

## 2016-09-23 DIAGNOSIS — Z96642 Presence of left artificial hip joint: Secondary | ICD-10-CM | POA: Diagnosis not present

## 2016-09-23 DIAGNOSIS — I10 Essential (primary) hypertension: Secondary | ICD-10-CM | POA: Diagnosis not present

## 2016-09-23 DIAGNOSIS — E669 Obesity, unspecified: Secondary | ICD-10-CM | POA: Diagnosis not present

## 2016-09-23 DIAGNOSIS — I700 Atherosclerosis of aorta: Secondary | ICD-10-CM | POA: Diagnosis not present

## 2016-09-23 DIAGNOSIS — M4186 Other forms of scoliosis, lumbar region: Secondary | ICD-10-CM

## 2016-09-23 DIAGNOSIS — M5431 Sciatica, right side: Secondary | ICD-10-CM

## 2016-09-23 DIAGNOSIS — E1169 Type 2 diabetes mellitus with other specified complication: Secondary | ICD-10-CM | POA: Diagnosis not present

## 2016-09-23 LAB — BASIC METABOLIC PANEL WITH GFR
BUN: 12 mg/dL (ref 7–25)
CALCIUM: 9.5 mg/dL (ref 8.6–10.4)
CHLORIDE: 104 mmol/L (ref 98–110)
CO2: 24 mmol/L (ref 20–31)
CREATININE: 0.76 mg/dL (ref 0.50–0.99)
GFR, EST NON AFRICAN AMERICAN: 80 mL/min (ref >=60)
GFR, Est African American: >89 mL/min (ref >=60)
Glucose, Bld: 110 mg/dL — ABNORMAL HIGH (ref 65–99)
Potassium: 4.1 mmol/L (ref 3.5–5.3)
Sodium: 142 mmol/L (ref 135–146)

## 2016-09-23 LAB — POCT UA - MICROALBUMIN
Creatinine, POC: 200 mg/dL
Microalbumin Ur, POC: 80 mg/L

## 2016-09-23 LAB — POCT GLYCOSYLATED HEMOGLOBIN (HGB A1C): Hemoglobin A1C: 6.7

## 2016-09-23 NOTE — Progress Notes (Signed)
Subjective:    Patient ID: Shelby Thomas, female    DOB: Mar 01, 1946, 71 y.o.   MRN: [id number]  HPI Diabetes - no hypoglycemic events. No wounds or sores that are not healing well. No increased thirst or urination. Checking glucose at home. Taking medications as prescribed without any side effects.She reports that her eye exam is up-to-date. She says she [name] on [place] [address].   Hypertension- Pt denies chest pain, SOB, dizziness, or heart palpitations.  Taking meds as directed w/o problems.  Denies medication side effects.    She also complains of some numbness going down her buttock area into her right lateral thigh all the way down to her right lateral foot. It only occurs at night when she's lying on her side to sleep. It really doesn't bother her during the day. It's been going on for well over a month and is concerned about her almost every night. Is not really complaining of any low back pain today.She says she's even tried sleeping with a pillow between her knees and it doesn't seem to help.  Review of Systems  BP 130/72   Pulse 81   Ht 5\' 3"  (1.6 m)   Wt 221 lb (100.2 kg)   SpO2 98%   BMI 39.15 kg/m     Allergies  Allergen Reactions  . Ramipril     REACTION: cough  . Tramadol Other (See Comments)    Nausea    Past Medical History:  Diagnosis Date  . CHF (congestive heart failure) (HCC)    mild  . Diabetes mellitus (HCC)   . Foraminal stenosis of lumbar region    L4--L5  . GERD (gastroesophageal reflux disease)   . Heart disease, congenital    large ASD, Sees Dr. Elmarie ShileyGoodfellow/card  . Hiatal hernia   . Hyperlipidemia    dyslipidemia  . Hypertension    pulmonary/on nighttime O2  . 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 OSA (obstructive sleep apnea)   . Pneumonia 2006  . Scoliosis    severe  . Spine misalignment    T spine 80% to right, L-Spine 55% to left    Past Surgical History:  Procedure Laterality Date  . CARDIAC SURGERY     repair of ASD  . ESOPHAGEAL DILATION  04-09   dilation/Dr. Inge Riseavid Wood  . REPLACEMENT TOTAL KNEE Left 12/08/13   Dr. Dossie Arbouralvin McCabe  . TOTAL HIP ARTHROPLASTY Left     Social History   Social History  . Marital status: Married    Spouse name: Shelby Thomas  . Number of children: 2  . Years of education: Shelby Thomas   Occupational History  . Not on file.   Social History Main Topics  . Smoking status: Never Smoker  . Smokeless tobacco: Never Used  . Alcohol use No  . Drug use: Unknown  . 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    Other Topics Concern  . Not on file   Social History Narrative  . No narrative on file    Family History  Problem Relation Age of Onset  . Diabetes Mother   . Stroke Mother   . CAD Mother        CABG in her 6770s    Outpatient Encounter Prescriptions as of 09/23/2016  Medication Sig  . AMBULATORY NON FORMULARY MEDICATION Medication Name: Free Style InsuLinx  test strips.  Use daily as directed  . AMBULATORY NON FORMULARY MEDICATION Medication Name: wheelchair  . aspirin 81 MG tablet Take 81 mg by mouth daily.    . calcium-vitamin D (OSCAL WITH D) 500-200 MG-UNIT per tablet Take 1 tablet by mouth daily with breakfast.  . celecoxib (CELEBREX) 200 MG capsule One to 2 tablets by mouth daily as needed for pain.  . furosemide (LASIX) 20 MG tablet TAKE ONE TABLET BY MOUTH DAILY FOR EDEMA  . glucosamine-chondroitin (GLUCOSAMINE-CHONDROITIN DS) 500-400 MG tablet Take by mouth.  . losartan (COZAAR) 25 MG tablet Take 1 tablet (25 mg total) by mouth daily.  . metFORMIN (GLUCOPHAGE) 500 MG tablet TAKE ONE TABLET BY MOUTH DAILY WITH BREAKFAST  . Multiple Vitamin (MULTIVITAMIN) capsule Take 1 capsule by mouth daily.    Marland Kitchen omeprazole (PRILOSEC) 40 MG capsule Take 1 capsule (40 mg total) by mouth daily.  . potassium chloride (K-DUR) 10 MEQ tablet 1 po QD PRN when taking furosemide.  .  simvastatin (ZOCOR) 40 MG tablet Take 1 tablet (40 mg total) by mouth every evening.   No facility-administered encounter medications on file as of 09/23/2016.           Objective:   Physical Exam  Constitutional: She is oriented to person, place, and time. She appears well-developed and well-nourished.  Sitting in wheelchair  HENT:  Head: Normocephalic and atraumatic.  Cardiovascular: Normal rate, regular rhythm and normal heart sounds.   Pulmonary/Chest: Effort normal and breath sounds normal.  Neurological: She is alert and oriented to person, place, and time.  Skin: Skin is warm and dry.  Psychiatric: She has a normal mood and affect. Her behavior is normal.        Assessment & Plan:  DM- Well-controlled. Hemoglobin A1c back under 7 today which is fantastic. A1c of 6.7 today. Will call for recent eye exam. She is down 5 pounds. Congratulated her on the recent weight loss.  HTN - Well controlled. Continue current regimen.  Right sided radiculopathy-most consistent with sciatica. Patient declined oral prednisone today. We'll get x-rays. Could be a candidate for gabapentin.

## 2016-09-24 NOTE — Progress Notes (Signed)
All labs are normal. 

## 2016-10-10 ENCOUNTER — Encounter: Payer: Self-pay | Admitting: Family Medicine

## 2016-10-14 ENCOUNTER — Other Ambulatory Visit: Payer: Self-pay | Admitting: Family Medicine

## 2016-10-14 ENCOUNTER — Ambulatory Visit (INDEPENDENT_AMBULATORY_CARE_PROVIDER_SITE_OTHER): Payer: Medicare Other | Admitting: Sports Medicine

## 2016-10-14 ENCOUNTER — Ambulatory Visit (INDEPENDENT_AMBULATORY_CARE_PROVIDER_SITE_OTHER): Payer: Medicare Other

## 2016-10-14 DIAGNOSIS — M76891 Other specified enthesopathies of right lower limb, excluding foot: Secondary | ICD-10-CM | POA: Diagnosis not present

## 2016-10-14 DIAGNOSIS — M2392 Unspecified internal derangement of left knee: Secondary | ICD-10-CM | POA: Diagnosis not present

## 2016-10-14 DIAGNOSIS — Z96652 Presence of left artificial knee joint: Secondary | ICD-10-CM

## 2016-10-14 DIAGNOSIS — M25662 Stiffness of left knee, not elsewhere classified: Secondary | ICD-10-CM | POA: Diagnosis not present

## 2016-10-14 DIAGNOSIS — M7989 Other specified soft tissue disorders: Principal | ICD-10-CM

## 2016-10-14 DIAGNOSIS — M79606 Pain in leg, unspecified: Secondary | ICD-10-CM

## 2016-10-14 DIAGNOSIS — Z96642 Presence of left artificial hip joint: Secondary | ICD-10-CM | POA: Diagnosis not present

## 2016-10-14 LAB — CBC WITH DIFFERENTIAL/PLATELET
Basophils Absolute: 0 {cells}/uL (ref 0–200)
Basophils Relative: 0 %
Eosinophils Absolute: 120 cells/uL (ref 15–500)
Eosinophils Relative: 2 %
HCT: 35.6 % (ref 35.0–45.0)
Hemoglobin: 10.8 g/dL — ABNORMAL LOW (ref 11.7–15.5)
Lymphocytes Relative: 28 %
Lymphs Abs: 1680 {cells}/uL (ref 850–3900)
MCH: 27.6 pg (ref 27.0–33.0)
MCHC: 30.3 g/dL — ABNORMAL LOW (ref 32.0–36.0)
MCV: 91 fL (ref 80.0–100.0)
MPV: 9.3 fL (ref 7.5–12.5)
Monocytes Absolute: 420 {cells}/uL (ref 200–950)
Monocytes Relative: 7 %
Neutro Abs: 3780 {cells}/uL (ref 1500–7800)
Neutrophils Relative %: 63 %
Platelets: 343 10*3/uL (ref 140–400)
RBC: 3.91 MIL/uL (ref 3.80–5.10)
RDW: 14.4 % (ref 11.0–15.0)
WBC: 6 10*3/uL (ref 3.8–10.8)

## 2016-10-14 LAB — COMPREHENSIVE METABOLIC PANEL
ALT: 7 U/L (ref 6–29)
Alkaline Phosphatase: 90 U/L (ref 33–130)
BUN: 10 mg/dL (ref 7–25)
CO2: 26 mmol/L (ref 20–31)
Chloride: 102 mmol/L (ref 98–110)
Glucose, Bld: 111 mg/dL — ABNORMAL HIGH (ref 65–99)
Potassium: 4.4 mmol/L (ref 3.5–5.3)

## 2016-10-14 LAB — COMPREHENSIVE METABOLIC PANEL WITH GFR
AST: 15 U/L (ref 10–35)
Albumin: 3.7 g/dL (ref 3.6–5.1)
Calcium: 9.2 mg/dL (ref 8.6–10.4)
Creat: 0.7 mg/dL (ref 0.50–0.99)
Sodium: 140 mmol/L (ref 135–146)
Total Bilirubin: 0.4 mg/dL (ref 0.2–1.2)
Total Protein: 7.2 g/dL (ref 6.1–8.1)

## 2016-10-14 NOTE — Progress Notes (Signed)
   Subjective:    I'm seeing this patient as a consultation for:  Dr. Beatrice Lecher  CC: Knee problems  HPI: This is a pleasant 71 year old female, I saw her 3 years ago for hip and knee osteoarthritis on the left, these joints were injected and she did well, since then she's had a left total knee arthroplasty and left hip arthroplasty. The hip arthroplasty went well, but the knee arthroplasty did not, it was done in [place]. She tells me she's had months of physical therapy without any improvement, and ultimately after she lost essentially all range of motion in her knee, her surgeons discharged her. She does endorse mild chills occasionally.    Past medical history:  Negative.  See flowsheet/record as well for more information.  Surgical history: Negative.  See flowsheet/record as well for more information.  Family history: Negative.  See flowsheet/record as well for more information.  Social history: Negative.  See flowsheet/record as well for more information.  Allergies, and medications have been entered into the medical record, reviewed, and no changes needed.   Review of Systems: No headache, visual changes, nausea, vomiting, diarrhea, constipation, dizziness, abdominal pain, skin rash, fevers, chills, night sweats, weight loss, swollen lymph nodes, body aches, joint swelling, muscle aches, chest pain, shortness of breath, mood changes, visual or auditory hallucinations.   Objective:   General: Well Developed, well nourished, and in no acute distress.  Neuro/Psych: Alert and oriented x3, extra-ocular muscles intact, able to move all 4 extremities, sensation grossly intact. Skin: Warm and dry, no rashes noted.  Respiratory: Not using accessory muscles, speaking in full sentences, trachea midline.  Cardiovascular: Pulses palpable, no extremity edema. Abdomen: Does not appear distended. Left hip: Good range of motion to flexion, extension, internal and external rotation.  Left  knee: Minimally swollen but range of motion only with 10 of extension lag and flexion to only 45. No tenderness to palpation, no warmth. No erythema or induration.  Impression and Recommendations:   This case required medical decision making of moderate complexity.  History of total knee arthroplasty, left With significant difficulties with range of motion, has about 10 of extension lag and can only flex to 45. She tells me she did do months of physical therapy. X-rays, CBC, CMP, ESR, if elevated we will do an arthrocentesis looking for infection. I do think she needs some more aggressive physical therapy, this will have to be home health PT because she is essentially homebound. Ultimately if the prosthesis looks okay, and aggressive physical therapy doesn't help and if there is no evidence of infection she will probably need manipulation under anesthesia. I have also asked her to keep close follow-up with her orthopedist with [facility].  History of total left hip arthroplasty Overall doing well, does have some discomfort but I think this is coming from her knee.

## 2016-10-14 NOTE — Assessment & Plan Note (Signed)
Overall doing well, does have some discomfort but I think this is coming from her knee.

## 2016-10-14 NOTE — Assessment & Plan Note (Signed)
With significant difficulties with range of motion, has about 10 of extension lag and can only flex to 45. She tells me she did do months of physical therapy. X-rays, CBC, CMP, ESR, if elevated we will do an arthrocentesis looking for infection. I do think she needs some more aggressive physical therapy, this will have to be home health PT because she is essentially homebound. Ultimately if the prosthesis looks okay, and aggressive physical therapy doesn't help and if there is no evidence of infection she will probably need manipulation under anesthesia. I have also asked her to keep close follow-up with her orthopedist with [facility].

## 2016-10-15 LAB — SEDIMENTATION RATE: Sed Rate: 53 mm/h — ABNORMAL HIGH (ref 0–30)

## 2016-10-23 ENCOUNTER — Other Ambulatory Visit: Payer: Self-pay | Admitting: Family Medicine

## 2016-10-23 DIAGNOSIS — M79606 Pain in leg, unspecified: Secondary | ICD-10-CM

## 2016-10-23 DIAGNOSIS — M7989 Other specified soft tissue disorders: Principal | ICD-10-CM

## 2016-11-03 ENCOUNTER — Telehealth: Payer: Self-pay | Admitting: *Deleted

## 2016-11-03 NOTE — Telephone Encounter (Signed)
Pt called and stated that Dr. Karie Schwalbe had PT come out to her home. They did come out and did an evaluation however, they have not returned because they are waiting to hear back from him to know what to do next.  Will fwd to Dr. Kandace Parkins.Zyshawn Bohnenkamp, Viann Shoveonya Lynetta

## 2016-11-03 NOTE — Telephone Encounter (Signed)
I'm happy to respond to them if they contact me. I haven't gotten anything yet but I am happy to sign anything if they need.

## 2016-11-11 NOTE — Telephone Encounter (Signed)
Patient called again yesterday stating no one has called back to get her started on PT. Called [facility] and they state they tried to get in touch with us on 10/27/16 and 10/28/2016. Spoke with Clydie BraunKaren, RN there and gave verbal order to continue plan of care. Notified patient of this.

## 2016-11-12 ENCOUNTER — Ambulatory Visit: Payer: Medicare Other | Admitting: Sports Medicine

## 2016-11-24 ENCOUNTER — Other Ambulatory Visit: Payer: Self-pay | Admitting: Family Medicine

## 2016-12-01 ENCOUNTER — Telehealth: Payer: Self-pay | Admitting: *Deleted

## 2016-12-01 NOTE — Telephone Encounter (Signed)
Okay, I'm happy to see her back.

## 2016-12-01 NOTE — Telephone Encounter (Signed)
Shelby Thomas,PT  from [facility] called and wanted to let Dr. Benjamin Stainhekkekandam and dr. Linford ArnoldMetheney know that this is the last day of PT at home for the  Patient.The PT recommends patient to be referred to orthopedic doctor for further evaluation and treatment. PT does not feel patient did not make good progress or improvement.

## 2016-12-03 NOTE — Telephone Encounter (Signed)
Patient notified

## 2016-12-04 ENCOUNTER — Telehealth: Payer: Self-pay | Admitting: Osteopathic Medicine

## 2016-12-04 ENCOUNTER — Ambulatory Visit (INDEPENDENT_AMBULATORY_CARE_PROVIDER_SITE_OTHER): Payer: Medicare Other

## 2016-12-04 ENCOUNTER — Ambulatory Visit (INDEPENDENT_AMBULATORY_CARE_PROVIDER_SITE_OTHER): Payer: Medicare Other | Admitting: Osteopathic Medicine

## 2016-12-04 VITALS — BP 132/77 | HR 85

## 2016-12-04 DIAGNOSIS — Z9989 Dependence on other enabling machines and devices: Secondary | ICD-10-CM | POA: Diagnosis not present

## 2016-12-04 DIAGNOSIS — M419 Scoliosis, unspecified: Secondary | ICD-10-CM | POA: Diagnosis not present

## 2016-12-04 DIAGNOSIS — R0602 Shortness of breath: Secondary | ICD-10-CM

## 2016-12-04 DIAGNOSIS — R0609 Other forms of dyspnea: Secondary | ICD-10-CM

## 2016-12-04 DIAGNOSIS — I270 Primary pulmonary hypertension: Secondary | ICD-10-CM | POA: Diagnosis not present

## 2016-12-04 DIAGNOSIS — G4733 Obstructive sleep apnea (adult) (pediatric): Secondary | ICD-10-CM | POA: Diagnosis not present

## 2016-12-04 DIAGNOSIS — R0600 Dyspnea, unspecified: Secondary | ICD-10-CM

## 2016-12-04 DIAGNOSIS — R05 Cough: Secondary | ICD-10-CM | POA: Diagnosis not present

## 2016-12-04 MED ORDER — FUROSEMIDE 20 MG PO TABS: 20.0000 mg | ORAL_TABLET | Freq: Every day | ORAL | 1 refills | Status: DC

## 2016-12-04 MED ORDER — AMBULATORY NON FORMULARY MEDICATION: 99 refills | Status: DC

## 2016-12-04 NOTE — Telephone Encounter (Signed)
Pt has been scheduled for 01/26/17 @0820  (this was the first available). She is going to work on transportation to make that appt.

## 2016-12-04 NOTE — Telephone Encounter (Signed)
-----   Message from Sunnie NielsenNatalie Alexander, DO sent at 12/04/2016 12:32 PM EDT ----- Regarding: [facility] appt Can we call and get this patient a cardiology appointment? I don't quite trust her to manage this herself. It has been some time since she was seen - Dr. Jens Somrenshaw. Non-emergent but ASAP please. If needs new referral, let me know.

## 2016-12-04 NOTE — Patient Instructions (Addendum)
Plan:  Lasix (water pill) daily instead of as needed   Repeat heart ultrasound  Will get you back in to see cardiologist Dr. Jens Somrenshaw   Please see Dr. Linford ArnoldMetheney next week to recheck breathing, blood pressure and labs on the Lasix   If worse, please call 911 or go to hospital!

## 2016-12-04 NOTE — Progress Notes (Signed)
HPI: Shelby Thomas is a 71 y.o. female  who [facility] today, 12/04/16,  for chief complaint of:  Chief Complaint  Patient presents with  . Shortness of Breath    SOB: Complaining of dyspnea on exertion over the past months, relatively stable over the past few weeks. Marland Kitchen. History of pulmonary hypertension, echocardiogram 06/03/2013 showed right ventricle systolic pressure between 50 and 60 mmHg consistent with moderately severe pulmonary hypertension.  . She is on no home O2, only CPAP qhs. No smoking history.   She takes Lasix as needed for swelling, has not used this for some time.  Substantial scoliosis  Previously following with cardiology, last seen 09/2015 by Dr. Jens Somrenshaw, was told to follow-up any year but has not. Previously following with pulmonology, patient is not sure last time she was seen.  Labs reviewed from 10/14/2016: Slight anemia at hemoglobin 10.8, normal MCV. No concerns on CMP, electrolytes kidney function and liver function were normal range, glucose slightly elevated. Sedimentation rate slightly increased at 53. Last A1c 09/23/2016 was 6.7.    Past medical history, surgical history, social history and family history reviewed.  Patient Active Problem List   Diagnosis Date Noted  . Primary osteoarthritis of left shoulder 07/07/2016  . Pain and swelling of lower extremity 12/31/2015  . OSA on CPAP 10/12/2013  . PVD (peripheral vascular disease) (HCC) 03/31/2012  . Obese 07/03/2011  . Diabetes mellitus type 2 in obese (HCC) 11/08/2010  . ARTHRITIS, CERVICAL SPINE 05/08/2009  . History of total knee arthroplasty, left 03/02/2008  . Lumbar radiculopathy 12/29/2007  . ANKLE EDEMA 12/29/2007  . ESOPHAGEAL STRICTURE 08/18/2007  . GERD 07/19/2007  . HAIR LOSS 04/19/2007  . History of total left hip arthroplasty 04/19/2007  . Pulmonary hypertension (HCC) 12/17/2006  . SCOLIOSIS NEC 12/17/2006  . VALVULAR HEART  DISEASE 08/14/2006  . Hyperlipidemia 04/10/2006  . HYPERTENSION, BENIGN ESSENTIAL 04/02/2006    Past Surgical History:  Procedure Laterality Date  . CARDIAC SURGERY     repair of ASD  . ESOPHAGEAL DILATION  04-09   dilation/Dr. Inge Riseavid Wood  . REPLACEMENT TOTAL KNEE Left 12/08/13   Dr. Dossie Arbouralvin McCabe  . TOTAL HIP ARTHROPLASTY Left      Social History   Social History  . Marital status: Married    Spouse name: N/A  . Number of children: 2  . Years of education: N/A   Occupational History  . Not on file.   Social History Main Topics  . Smoking status: Never Smoker  . Smokeless tobacco: Never Used  . Alcohol use No  . Drug use: Unknown  . 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    Other Topics Concern  . Not on file   Social History Narrative  . No narrative on file     Current medication list and allergy/intolerance information reviewed.   Current Outpatient Prescriptions on File Prior to Visit  Medication Sig Dispense Refill  . AMBULATORY NON FORMULARY MEDICATION Medication Name: Free Style [employer] test strips.  Use daily as directed 50 strip 12  . AMBULATORY NON FORMULARY MEDICATION Medication Name: wheelchair 1 each 0  . aspirin 81 MG tablet Take 81 mg by mouth daily.      . calcium-vitamin D (OSCAL WITH D) 500-200 MG-UNIT per tablet Take 1 tablet by mouth daily with breakfast.    . celecoxib (CELEBREX)  200 MG capsule One to 2 tablets by mouth daily as needed for pain. 60 capsule 2  . furosemide (LASIX) 20 MG tablet TAKE ONE TABLET BY MOUTH DAILY FOR EDEMA 20 tablet 1  . glucosamine-chondroitin (GLUCOSAMINE-CHONDROITIN DS) 500-400 MG tablet Take by mouth.    . losartan (COZAAR) 25 MG tablet TAKE ONE TABLET BY MOUTH DAILY 90 tablet 1  . metFORMIN (GLUCOPHAGE) 500 MG tablet TAKE ONE TABLET BY MOUTH DAILY WITH BREAKFAST 30 tablet 11  . Multiple Vitamin (MULTIVITAMIN)  capsule Take 1 capsule by mouth daily.      Marland Kitchen omeprazole (PRILOSEC) 40 MG capsule TAKE ONE CAPSULE BY MOUTH DAILY 30 capsule 11  . potassium chloride (MICRO-K) 10 MEQ CR capsule TAKE ONE TABLET BY MOUTH ONCE DAILY AS NEEDED WHEN TAKING FUROSEMIDE 30 capsule 11  . simvastatin (ZOCOR) 40 MG tablet Take 1 tablet (40 mg total) by mouth every evening. 90 tablet 3   No current facility-administered medications on file prior to visit.    Allergies  Allergen Reactions  . Ramipril     REACTION: cough  . Tramadol Other (See Comments)    Nausea      Review of Systems:  Constitutional: No Fever or chills  HEENT: No  headache, no vision change  Cardiac: No  chest pain, No  pressure, No palpitations  Respiratory:  +shortness of breath. No  Cough  Gastrointestinal: No  abdominal pain  Musculoskeletal: No new myalgia/arthralgia  Skin: No  Rash  Hem/Onc: No  easy bruising/bleeding, No  abnormal lumps/bumps  Neurologic: No  weakness, No  Dizziness  Psychiatric: No  concerns with depression, No  concerns with anxiety  Exam:  BP 132/77   Pulse 85   SpO2 92%  on record review last measured SaO2 was 98% on 09/23/2016.  Constitutional: VS see above. General Appearance: alert, well-developed, well-nourished, NAD  Eyes: Normal lids and conjunctive, non-icteric sclera  Ears, Nose, Mouth, Throat: MMM, Normal external inspection ears/nares/mouth/lips/gums.  Neck: No masses, trachea midline.   Respiratory: Normal respiratory effort. no wheeze, no rhonchi, no rales  Cardiovascular: S1/S2 normal, +murmur, no rub/gallop auscultated. RRR.   Musculoskeletal: Symmetric and independent movement of all extremities. Patient is in wheelchair, gait not able to be evaluated. Substantial scoliotic curve to the right.   Neurological: Normal coordination. No tremor.  Skin: warm, dry, intact.   Psychiatric: Normal judgment/insight. Normal mood and affect. Oriented x3.      CXR personally  reviewed: over-read as below. Substantial scoliotic curve, heart does not appear enlarged from previous chest x-ray.  Dg Chest 2 View  Result Date: 12/04/2016 CLINICAL DATA:  Shortness of breath, cough for 1 month EXAM: CHEST  2 VIEW COMPARISON:  08/29/2014 FINDINGS: Prior median sternotomy. Severe scoliosis somewhat limits the study. Parenchymal scarring at the right lung base. No visible confluent opacity on the left. No effusions or acute bony abnormality. IMPRESSION: Right basilar scarring. Limited study by severe scoliosis. No definite acute process. Electronically Signed   By: Charlett Nose M.D.   On: 12/04/2016 10:34     Testing for oxygen therapy  O2 sat at rest on room air: 92 Test with exertion: Unable to perform, patient states she does not feel comfortable walking, she is in a wheelchair for the visit.     ASSESSMENT/PLAN: The primary encounter diagnosis was Dyspnea on exertion. Diagnoses of Pulmonary hypertension, primary (HCC), OSA on CPAP, and Scoliosis of thoracic spine, unspecified scoliosis type were also pertinent to this visit.   Suspect worsening  of pulmonary hypertension, complicated by likely restriction due to severe scoliosis, generalized debility.   Patient has been lost to follow-up with specialists for some time, recommend getting back into be seen with cardiology and pulmonology.  In the meantime, will get her on oxygen 24/7, initiate daily diuresis rather than just using Lasix when necessary - recent renal function no concerns. Will need close follow-up in the office with PCP.   Orders Placed This Encounter  Procedures  . DG Chest 2 View  . ECHOCARDIOGRAM COMPLETE   Patient Instructions  Plan:  Lasix (water pill) daily instead of as needed   Repeat heart ultrasound  Will get you back in to see cardiologist Dr. Jens Somrenshaw   Please see Dr. Linford ArnoldMetheney next week to recheck breathing, blood pressure and labs on the Lasix   If worse, please call 911 or go to  hospital!    Follow-up plan: Return in about 1 week (around 12/11/2016) for recheck breathing with PCP.  Visit summary with medication list and pertinent instructions was printed for patient to review, alert us if any changes needed. All questions at time of visit were answered - patient instructed to contact office with any additional concerns. ER/RTC precautions were reviewed with the patient and understanding verbalized.

## 2016-12-05 ENCOUNTER — Other Ambulatory Visit: Payer: Self-pay | Admitting: Family Medicine

## 2016-12-05 ENCOUNTER — Telehealth: Payer: Self-pay | Admitting: *Deleted

## 2016-12-05 DIAGNOSIS — M7989 Other specified soft tissue disorders: Principal | ICD-10-CM

## 2016-12-05 DIAGNOSIS — M79606 Pain in leg, unspecified: Secondary | ICD-10-CM

## 2016-12-05 NOTE — Telephone Encounter (Signed)
Sounds good to me. Thanks for taking care of this!

## 2016-12-05 NOTE — Telephone Encounter (Signed)
Patient notified. Patient states she has to arrange transportation and then she will call back to schedule a nurse visit to recheck her or O2 sats

## 2016-12-05 NOTE — Telephone Encounter (Signed)
Nures from [facility] called an to states they did receive the order from for O2 but 92 pulse ox is not going to qualify her. The dx of OSA, (which is what patient is diagnosed with ) will not cover overnight oximetry. Patient will need to come back in to be retested and we need a desat level of 88. Patient is in a wheelchair so can get her to exert herself by moving arms and legs per [facility] nurse. This can be a nurse visit. I can call patient to schedule this. Are you in agreement?  [phone])

## 2016-12-08 ENCOUNTER — Other Ambulatory Visit: Payer: Self-pay | Admitting: Family Medicine

## 2016-12-08 DIAGNOSIS — M7989 Other specified soft tissue disorders: Principal | ICD-10-CM

## 2016-12-08 DIAGNOSIS — M79606 Pain in leg, unspecified: Secondary | ICD-10-CM

## 2016-12-11 NOTE — Telephone Encounter (Signed)
Noted. Would make sure she keep appt tomorrow with PCP

## 2016-12-12 ENCOUNTER — Ambulatory Visit: Payer: Medicare Other | Admitting: Family Medicine

## 2016-12-12 DIAGNOSIS — Z0189 Encounter for other specified special examinations: Secondary | ICD-10-CM

## 2016-12-23 ENCOUNTER — Ambulatory Visit: Payer: Medicare Other | Admitting: Family Medicine

## 2016-12-24 ENCOUNTER — Telehealth: Payer: Self-pay | Admitting: Family Medicine

## 2016-12-24 ENCOUNTER — Ambulatory Visit (INDEPENDENT_AMBULATORY_CARE_PROVIDER_SITE_OTHER): Payer: Medicare Other | Admitting: Family Medicine

## 2016-12-24 ENCOUNTER — Telehealth: Payer: Self-pay | Admitting: Cardiology

## 2016-12-24 ENCOUNTER — Encounter: Payer: Self-pay | Admitting: Family Medicine

## 2016-12-24 VITALS — BP 137/59 | HR 87

## 2016-12-24 DIAGNOSIS — M79606 Pain in leg, unspecified: Secondary | ICD-10-CM

## 2016-12-24 DIAGNOSIS — E669 Obesity, unspecified: Secondary | ICD-10-CM | POA: Diagnosis not present

## 2016-12-24 DIAGNOSIS — E1169 Type 2 diabetes mellitus with other specified complication: Secondary | ICD-10-CM

## 2016-12-24 DIAGNOSIS — I272 Pulmonary hypertension, unspecified: Secondary | ICD-10-CM | POA: Diagnosis not present

## 2016-12-24 DIAGNOSIS — Z23 Encounter for immunization: Secondary | ICD-10-CM

## 2016-12-24 DIAGNOSIS — M7989 Other specified soft tissue disorders: Secondary | ICD-10-CM

## 2016-12-24 LAB — POCT GLYCOSYLATED HEMOGLOBIN (HGB A1C): HEMOGLOBIN A1C: 6.7

## 2016-12-24 MED ORDER — POTASSIUM CHLORIDE ER 10 MEQ PO CPCR: ORAL_CAPSULE | ORAL | 11 refills | Status: DC

## 2016-12-24 NOTE — Telephone Encounter (Signed)
Ok to be seen in [place] next available [facility]

## 2016-12-24 NOTE — Telephone Encounter (Signed)
I called cardiology and Dr. Ludwig Clarks first available appointment in [place] is not until Dec. 12th. I called them back and asked them if patient could be seen in [facility] due to she is not able to travel to [place]. They stated they would send a message to Dr. Jens Som for him to ok her switching providers and once they receive the ok they are going to call her and schedule her in [facility] which they stated they could get her scheduled there soon. - CF

## 2016-12-24 NOTE — Telephone Encounter (Signed)
New message       Pt no longer has transportation to [employer].  Is it ok to start seeing the cardiologist in [place]?  She needs to be seen sooner than than the next Dr Jens Som [place] opening

## 2016-12-24 NOTE — Progress Notes (Signed)
   Subjective:    Patient ID: Shelby Thomas, female    DOB: 1946-01-10, 71 y.o.   MRN: [id number]  HPI F/U dyspnea on exertion. She was actual seen in our office by one of my partners for dyspnea they have been going on for couple of months at that point in time. She does have a known history of pulmonary hypertension with an echocardiogram from January 2015 showing right ventricle systolic pressure between 50 and 60 mmHg consistent with moderately severe pulmonary hypertension. She still feels SOB when she gets up and oves around but feels fine at rest. .  She was referred to cardiology and pulmonology which she had not seen in quite some time.She also increased her Lasix to daily instead of as needed.She actually does feel like the shortness of breath has improved. She still gets it if she is active but sitting at rest she's no longer experiencing it and she feels that her lower extremity swelling has improved. She admits she does not wear her compression stockings during the summertime. She did receive a call from cardiology but says she cannot get to [facility] because of transportation issues.   Diabetes - no hypoglycemic events. No wounds or sores that are not healing well. No increased thirst or urination. Checking glucose at home. Taking medications as prescribed without any side effects.  Review of Systems     Objective:   Physical Exam  Constitutional: She is oriented to person, place, and time. She appears well-developed and well-nourished.  HENT:  Head: Normocephalic and atraumatic.  Cardiovascular: Normal rate, regular rhythm and normal heart sounds.   Pulmonary/Chest: Effort normal and breath sounds normal.  Musculoskeletal:  Trace pitting edema bilaterally.   Neurological: She is alert and oriented to person, place, and time.  Skin: Skin is warm and dry.  Psychiatric: She has a normal mood and affect. Her behavior is normal.        Assessment & Plan:  Pulmonary  hypertension- will get echo authorized and schedule. Unfortunately she had not heard from them over the last 2 weeks so check with her referral coordinator and see where we are at on that.she was contacted by cardiology but there available appointment was in [place] and she has transportation issues that she needs something a little more locally. We'll work on trying to get her in with Dr. Jens Som here downstairs.  Dyspnea on exertion-verall she iproved though not completely mptomatic. The furosemidast helping with t shortness oh and the lower extremity ontrolling I Work on get her in with Cardiology.   Diabetes - Well controlled. Continue current regimen. Follow up in  3-4 months.    LE swelling = improved on daily lasix. REcheck BMP today.

## 2016-12-25 ENCOUNTER — Other Ambulatory Visit: Payer: Self-pay | Admitting: Family Medicine

## 2016-12-25 LAB — BASIC METABOLIC PANEL WITH GFR
BUN: 8 mg/dL (ref 7–25)
CALCIUM: 9.2 mg/dL (ref 8.6–10.4)
CHLORIDE: 104 mmol/L (ref 98–110)
CO2: 25 mmol/L (ref 20–32)
CREATININE: 0.62 mg/dL (ref 0.50–0.99)
GFR, Est African American: >89 mL/min (ref >=60)
GFR, Est Non African American: >89 mL/min (ref >=60)
GLUCOSE: 122 mg/dL — AB (ref 65–99)
Potassium: 4.4 mmol/L (ref 3.5–5.3)
Sodium: 142 mmol/L (ref 135–146)

## 2016-12-25 NOTE — Telephone Encounter (Signed)
Spoke with pt, Follow up scheduled with dr Jens Som in [place] location per pt request.

## 2016-12-25 NOTE — Progress Notes (Signed)
All labs are normal. 

## 2016-12-26 ENCOUNTER — Ambulatory Visit (HOSPITAL_BASED_OUTPATIENT_CLINIC_OR_DEPARTMENT_OTHER): Admission: RE | Admit: 2016-12-26 | Payer: Medicare Other | Source: Ambulatory Visit

## 2017-01-26 ENCOUNTER — Ambulatory Visit: Payer: Medicare Other | Admitting: Cardiology

## 2017-03-03 ENCOUNTER — Encounter: Payer: Self-pay | Admitting: Family Medicine

## 2017-03-03 ENCOUNTER — Ambulatory Visit (INDEPENDENT_AMBULATORY_CARE_PROVIDER_SITE_OTHER): Payer: Medicare Other

## 2017-03-03 ENCOUNTER — Ambulatory Visit (INDEPENDENT_AMBULATORY_CARE_PROVIDER_SITE_OTHER): Payer: Medicare Other | Admitting: Family Medicine

## 2017-03-03 VITALS — BP 126/55 | HR 95 | Ht 63.0 in | Wt 235.0 lb

## 2017-03-03 DIAGNOSIS — R05 Cough: Secondary | ICD-10-CM

## 2017-03-03 DIAGNOSIS — R601 Generalized edema: Secondary | ICD-10-CM | POA: Diagnosis not present

## 2017-03-03 DIAGNOSIS — R059 Cough, unspecified: Secondary | ICD-10-CM

## 2017-03-03 DIAGNOSIS — R0602 Shortness of breath: Secondary | ICD-10-CM

## 2017-03-03 DIAGNOSIS — J9811 Atelectasis: Secondary | ICD-10-CM

## 2017-03-03 DIAGNOSIS — E611 Iron deficiency: Secondary | ICD-10-CM

## 2017-03-03 DIAGNOSIS — I700 Atherosclerosis of aorta: Secondary | ICD-10-CM | POA: Diagnosis not present

## 2017-03-03 NOTE — Progress Notes (Signed)
Subjective:    Patient ID: Shelby Thomas, female    DOB: May 21, 1945, 71 y.o.   MRN: [id number]  HPI 71 year old female comes in today because of worsening swelling and shortness of breath.  Her husband came in with her today.  He says it has really been going on for months.  He says that she can not walk more than about 5 steps without getting short of breath and having to stop and rest.  She also cannot stand for very long because she starts to feel weak.  She has had some chronic lower extremity swelling.  She says that she has been wearing her compression stockings regularly.  She denies any chest pain or extra noises in the chest.  She has been a little bit more debilitated ever since having her left knee replacement about 3 years ago.  She never completely recovered from that.  She does have Lasix at home but admits that she does not take it regularly.  The last time she took it was about 4 days ago on Friday.  She has not noticed a difference in her breathing when she takes it versus when she does not take it.  She does note that she sleeps on a bigger pillow than she used to.  It is about the equivalent of 2 pillows put together. She is on CPAP at night.  He does not wear oxygen in the daytime. Looks like she was also recently referred for an echocardiogram but actually canceled it.  No family history of pulmonary problems.  Review of Systems   BP (!) 126/55   Pulse 95   Ht 5\' 3"  (1.6 m)   Wt 235 lb (106.6 kg)   SpO2 90%   BMI 41.63 kg/m     Allergies  Allergen Reactions  . Ramipril     REACTION: cough  . Tramadol Other (See Comments)    Nausea    Past Medical History:  Diagnosis Date  . CHF (congestive heart failure) (HCC)    mild  . Diabetes mellitus (HCC)   . Foraminal stenosis of lumbar region    L4--L5  . GERD (gastroesophageal reflux disease)   . Heart disease, congenital    large ASD, Sees Dr. Elmarie Shiley  . Hiatal hernia   . Hyperlipidemia    dyslipidemia  . Hypertension    pulmonary/on nighttime O2  . 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 OSA (obstructive sleep apnea)   . Pneumonia 2006  . Scoliosis    severe  . Spine misalignment    T spine 80% to right, L-Spine 55% to left    Past Surgical History:  Procedure Laterality Date  . CARDIAC SURGERY     repair of ASD  . ESOPHAGEAL DILATION  04-09   dilation/Dr. Inge Rise  . REPLACEMENT TOTAL KNEE Left 12/08/13   Dr. Dossie Arbour  . TOTAL HIP ARTHROPLASTY Left     Social History   Social History  . Marital status: Married    Spouse name: N/A  . Number of children: 2  . Years of education: N/A   Occupational History  . Not on file.   Social History Main Topics  . Smoking status: Never Smoker  . Smokeless tobacco: Never Used  . Alcohol use No  . Drug use: Unknown  . 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  Other Topics Concern  . Not on file   Social History Narrative  . No narrative on file    Family History  Problem Relation Age of Onset  . Diabetes Mother   . Stroke Mother   . CAD Mother        CABG in her 63s    Outpatient Encounter Prescriptions as of 03/03/2017  Medication Sig  . AMBULATORY NON FORMULARY MEDICATION Medication Name: Free Style [employer] test strips.  Use daily as directed  . AMBULATORY NON FORMULARY MEDICATION Medication Name: wheelchair  . AMBULATORY NON FORMULARY MEDICATION Medication Name: oxygen via [place] in daytime, stationary and portable requested, 2L/min. Diagnosis: pulmonary hypertension, dyspnea on exertion  . AMBULATORY NON FORMULARY MEDICATION Medication Name: Oxygen with CPAP qhs at 2L/min. Diagnosis: Pulmonary Hypertension, Dyspnea on Exertion  . aspirin 81 MG tablet Take 81 mg by mouth daily.    . calcium-vitamin D (OSCAL WITH D) 500-200 MG-UNIT per tablet Take 1 tablet by mouth daily with breakfast.  .  celecoxib (CELEBREX) 200 MG capsule One to 2 tablets by mouth daily as needed for pain.  . furosemide (LASIX) 20 MG tablet Take 1 tablet (20 mg total) by mouth daily.  Marland Kitchen glucosamine-chondroitin (GLUCOSAMINE-CHONDROITIN DS) 500-400 MG tablet Take by mouth.  . losartan (COZAAR) 25 MG tablet TAKE ONE TABLET BY MOUTH DAILY  . metFORMIN (GLUCOPHAGE) 500 MG tablet TAKE ONE TABLET BY MOUTH DAILY WITH BREAKFAST  . Multiple Vitamin (MULTIVITAMIN) capsule Take 1 capsule by mouth daily.    Marland Kitchen omeprazole (PRILOSEC) 40 MG capsule TAKE ONE CAPSULE BY MOUTH DAILY  . potassium chloride (MICRO-K) 10 MEQ CR capsule TAKE ONE TABLET BY MOUTH ONCE DAILY AS NEEDED WHEN TAKING FUROSEMIDE  . simvastatin (ZOCOR) 40 MG tablet Take 1 tablet (40 mg total) by mouth every evening.   No facility-administered encounter medications on file as of 03/03/2017.           Objective:   Physical Exam  Constitutional: She is oriented to person, place, and time. She appears well-developed and well-nourished.  HENT:  Head: Normocephalic and atraumatic.  Right Ear: External ear normal.  Left Ear: External ear normal.  Cardiovascular: Normal rate, regular rhythm and normal heart sounds.   Pulmonary/Chest: Effort normal and breath sounds normal. No respiratory distress. She has no wheezes. She has no rales. She exhibits no tenderness.  Neurological: She is alert and oriented to person, place, and time.  Skin: Skin is warm and dry.  Psychiatric: She has a normal mood and affect. Her behavior is normal.        Assessment & Plan:  Chronic dyspnea with chronic cough unclear etiology at this point.  She is not having any actual chest pain so unlikely to be ischemic heart disease.  She is up 6 pounds so if this could be from volume overload.  She does not take her Lasix regularly.  I want her to get back on this daily I am going to go ahead and give her IM Lasix 40 mg today.  She does not have a scale at home but thinks that she can  borrow one from a neighbor.  I want her to follow along and then follow-up on Monday for a weight check and blood pressure check.  She actually had a chest x-ray in August for shortness of breath.  It just showed some right basilar scarring but no definite acute process.  We will move forward with chest CT for further evaluation.  Peak flows were  in the red zone.  Given albuterol nebulizer treatment here in the office without significant improvement post albuterol.  Volume overload-her weight is up to 235 pounds which is about 9 pounds up from June.  OSA - she does wear her CPAP.

## 2017-03-04 DIAGNOSIS — D649 Anemia, unspecified: Secondary | ICD-10-CM | POA: Insufficient documentation

## 2017-03-04 LAB — CBC
HCT: 31.7 % — ABNORMAL LOW (ref 35.0–45.0)
Hemoglobin: 9.8 g/dL — ABNORMAL LOW (ref 11.7–15.5)
MCH: 26.7 pg — AB (ref 27.0–33.0)
MCHC: 30.9 g/dL — ABNORMAL LOW (ref 32.0–36.0)
MCV: 86.4 fL (ref 80.0–100.0)
MPV: 9.8 fL (ref 7.5–12.5)
PLATELETS: 397 10*3/uL (ref 140–400)
RBC: 3.67 10*6/uL — AB (ref 3.80–5.10)
RDW: 15 % (ref 11.0–15.0)
WBC: 7.4 10*3/uL (ref 3.8–10.8)

## 2017-03-04 LAB — BRAIN NATRIURETIC PEPTIDE: Brain Natriuretic Peptide: 121 pg/mL — ABNORMAL HIGH (ref <100)

## 2017-03-04 LAB — IRON, TOTAL/TOTAL IRON BINDING CAP
%SAT: 5 % (calc) — ABNORMAL LOW (ref 11–50)
IRON: 18 ug/dL — AB (ref 45–160)
TIBC: 381 ug/dL (ref 250–450)

## 2017-03-04 LAB — COMPLETE METABOLIC PANEL WITH GFR
AG Ratio: 1.1 (calc) (ref 1.0–2.5)
ALBUMIN MSPROF: 3.6 g/dL (ref 3.6–5.1)
ALKALINE PHOSPHATASE (APISO): 88 U/L (ref 33–130)
ALT: 13 U/L (ref 6–29)
AST: 17 U/L (ref 10–35)
BUN: 9 mg/dL (ref 7–25)
CALCIUM: 9.1 mg/dL (ref 8.6–10.4)
CO2: 32 mmol/L (ref 20–32)
CREATININE: 0.69 mg/dL (ref 0.60–0.93)
Chloride: 102 mmol/L (ref 98–110)
GFR, Est African American: 102 mL/min/{1.73_m2} (ref >=60)
GFR, Est Non African American: 88 mL/min/{1.73_m2} (ref >=60)
GLUCOSE: 91 mg/dL (ref 65–99)
Globulin: 3.3 g/dL (calc) (ref 1.9–3.7)
Potassium: 4.5 mmol/L (ref 3.5–5.3)
Sodium: 143 mmol/L (ref 135–146)
Total Bilirubin: 0.4 mg/dL (ref 0.2–1.2)
Total Protein: 6.9 g/dL (ref 6.1–8.1)

## 2017-03-04 LAB — TEST AUTHORIZATION

## 2017-03-04 LAB — FERRITIN: FERRITIN: 8 ng/mL — AB (ref 20–288)

## 2017-03-04 LAB — VITAMIN B12: VITAMIN B 12: 556 pg/mL (ref 200–1100)

## 2017-03-06 NOTE — Addendum Note (Signed)
Addended by: Deno EtienneBARKLEY, Sathvika Ojo L on: 03/06/2017 08:19 AM   Modules accepted: Orders

## 2017-03-09 ENCOUNTER — Other Ambulatory Visit: Payer: Self-pay | Admitting: Family Medicine

## 2017-03-09 ENCOUNTER — Ambulatory Visit (INDEPENDENT_AMBULATORY_CARE_PROVIDER_SITE_OTHER): Payer: Medicare Other | Admitting: Family Medicine

## 2017-03-09 ENCOUNTER — Encounter: Payer: Self-pay | Admitting: Family Medicine

## 2017-03-09 VITALS — BP 121/50 | HR 99 | Wt 226.0 lb

## 2017-03-09 DIAGNOSIS — R0602 Shortness of breath: Secondary | ICD-10-CM

## 2017-03-09 DIAGNOSIS — R600 Localized edema: Secondary | ICD-10-CM | POA: Diagnosis not present

## 2017-03-09 LAB — BASIC METABOLIC PANEL WITH GFR
BUN: 9 mg/dL (ref 7–25)
CHLORIDE: 100 mmol/L (ref 98–110)
CO2: 31 mmol/L (ref 20–32)
CREATININE: 0.75 mg/dL (ref 0.60–0.93)
Calcium: 9.3 mg/dL (ref 8.6–10.4)
GFR, EST NON AFRICAN AMERICAN: 81 mL/min/{1.73_m2} (ref >=60)
GFR, Est African American: 94 mL/min/{1.73_m2} (ref >=60)
GLUCOSE: 118 mg/dL — AB (ref 65–99)
POTASSIUM: 4.6 mmol/L (ref 3.5–5.3)
SODIUM: 141 mmol/L (ref 135–146)

## 2017-03-09 NOTE — Progress Notes (Signed)
Subjective:    Patient ID: Shelby Thomas, female    DOB: 1946/01/18, 71 y.o.   MRN: [id number]  HPI 71-year-old female comes in today to follow-up on recent shortness of breath.  I saw her about 1 week ago and at that time point I felt like she was about 6 pounds volume overloaded.  Do not use her Lasix daily at that point so I had her start taking her medication daily in addition to the fact that we gave her 40 mg of IM Lasix to help get her diuresing well.  She actually feels much much better.  She says she actually feels about 75% better in regards to her shortness of breath and weakness.  She has been taking her Lasix and potassium daily.   Review of Systems  BP (!) 121/50   Pulse 99   Wt 226 lb (102.5 kg)   SpO2 93%   BMI 40.03 kg/m     Allergies  Allergen Reactions  . Ramipril     REACTION: cough  . Tramadol Other (See Comments)    Nausea    Past Medical History:  Diagnosis Date  . CHF (congestive heart failure) (HCC)    mild  . Diabetes mellitus (HCC)   . Foraminal stenosis of lumbar region    L4--L5  . GERD (gastroesophageal reflux disease)   . Heart disease, congenital    large ASD, Sees Dr. Elmarie ShileyGoodfellow/card  . Hiatal hernia   . Hyperlipidemia    dyslipidemia  . Hypertension    pulmonary/on nighttime O2  . 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 OSA (obstructive sleep apnea)   . Pneumonia 2006  . Scoliosis    severe  . Spine misalignment    T spine 80% to right, L-Spine 55% to left    Past Surgical History:  Procedure Laterality Date  . CARDIAC SURGERY     repair of ASD  . ESOPHAGEAL DILATION  04-09   dilation/Dr. Inge Riseavid Wood  . REPLACEMENT TOTAL KNEE Left 12/08/13   Dr. Dossie Arbouralvin McCabe  . TOTAL HIP ARTHROPLASTY Left     Social History   Socioeconomic History  . Marital status: Married    Spouse name: Not on file  . Number of children: 2  . Years of education: Not on file  . Highest education level: Not on file  Social Needs  . Financial  resource strain: Not on file  . Food insecurity - worry: Not on file  . Food insecurity - inability: Not on file  . Transportation needs - medical: Not on file  . Transportation needs - non-medical: Not on file  Occupational History  . Not on file  Tobacco Use  . Smoking status: Never Smoker  . Smokeless tobacco: Never Used  Substance and Sexual Activity  . Alcohol use: No  . Drug use: Not on file  . 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   Other Topics Concern  . Not on file  Social History Narrative  . Not on file    Family History  Problem Relation Age of Onset  . Diabetes Mother   . Stroke Mother   . CAD Mother        CABG in her 6170s    Outpatient Encounter Medications as of 03/09/2017  Medication Sig  . AMBULATORY NON FORMULARY MEDICATION Medication Name: Free Style [employer] test strips.  Use daily as directed  . AMBULATORY NON FORMULARY MEDICATION Medication Name: wheelchair  . AMBULATORY NON FORMULARY MEDICATION Medication Name: oxygen via [place] in daytime, stationary and portable requested, 2L/min. Diagnosis: pulmonary hypertension, dyspnea on exertion  . AMBULATORY NON FORMULARY MEDICATION Medication Name: Oxygen with CPAP qhs at 2L/min. Diagnosis: Pulmonary Hypertension, Dyspnea on Exertion  . aspirin 81 MG tablet Take 81 mg by mouth daily.    . furosemide (LASIX) 20 MG tablet Take 1 tablet (20 mg total) by mouth daily.  Marland Kitchen glucosamine-chondroitin (GLUCOSAMINE-CHONDROITIN DS) 500-400 MG tablet Take by mouth.  . losartan (COZAAR) 25 MG tablet TAKE ONE TABLET BY MOUTH DAILY  . metFORMIN (GLUCOPHAGE) 500 MG tablet TAKE ONE TABLET BY MOUTH DAILY WITH BREAKFAST  . Multiple Vitamin (MULTIVITAMIN) capsule Take 1 capsule by mouth daily.    Marland Kitchen omeprazole (PRILOSEC) 40 MG capsule TAKE ONE CAPSULE BY MOUTH DAILY  . potassium chloride (MICRO-K) 10 MEQ CR capsule TAKE ONE TABLET BY MOUTH  ONCE DAILY AS NEEDED WHEN TAKING FUROSEMIDE  . simvastatin (ZOCOR) 40 MG tablet Take 1 tablet (40 mg total) by mouth every evening.  . [DISCONTINUED] calcium-vitamin D (OSCAL WITH D) 500-200 MG-UNIT per tablet Take 1 tablet by mouth daily with breakfast.  . [DISCONTINUED] celecoxib (CELEBREX) 200 MG capsule One to 2 tablets by mouth daily as needed for pain.   No facility-administered encounter medications on file as of 03/09/2017.          Objective:   Physical Exam  Constitutional: She is oriented to person, place, and time. She appears well-developed and well-nourished.  HENT:  Head: Normocephalic and atraumatic.  Cardiovascular: Normal rate, regular rhythm and normal heart sounds.  Pulmonary/Chest: Effort normal and breath sounds normal.  Musculoskeletal:  Trace pitting edema over lower legs to mid tibia bilaterally.   Neurological: She is alert and oriented to person, place, and time.  Skin: Skin is warm and dry.  Psychiatric: She has a normal mood and affect. Her behavior is normal.        Assessment & Plan:  Volume overload-suspect heart failure as she had significant volume overload off of her Lasix and had an elevated BNP.  The last echocardiogram was 3 years ago.  Placed another order in August but she had not gone yet.  Due to recheck renal function as well as potassium to adjust dose if needed.  I will make sure not over diuresing her as she is down 9 pounds which is quite significant.  Though she is feeling about 75% better which is fantastic.  We will call with results once available.  It looks like she was never scheduled for the echocardiogram so we will print that off and get Arline Asp to work on this as well.

## 2017-03-10 NOTE — Progress Notes (Signed)
All labs are normal. 

## 2017-03-30 ENCOUNTER — Encounter: Payer: Self-pay | Admitting: Family Medicine

## 2017-03-30 ENCOUNTER — Other Ambulatory Visit: Payer: Self-pay

## 2017-03-30 ENCOUNTER — Ambulatory Visit (INDEPENDENT_AMBULATORY_CARE_PROVIDER_SITE_OTHER): Payer: Medicare Other | Admitting: Family Medicine

## 2017-03-30 ENCOUNTER — Telehealth: Payer: Self-pay

## 2017-03-30 VITALS — BP 146/76 | HR 111 | Temp 97.6°F | Ht 63.0 in | Wt 231.0 lb

## 2017-03-30 DIAGNOSIS — R000 Tachycardia, unspecified: Secondary | ICD-10-CM

## 2017-03-30 DIAGNOSIS — I270 Primary pulmonary hypertension: Secondary | ICD-10-CM | POA: Diagnosis not present

## 2017-03-30 DIAGNOSIS — R0609 Other forms of dyspnea: Secondary | ICD-10-CM

## 2017-03-30 DIAGNOSIS — R0602 Shortness of breath: Secondary | ICD-10-CM

## 2017-03-30 DIAGNOSIS — I5021 Acute systolic (congestive) heart failure: Secondary | ICD-10-CM | POA: Diagnosis not present

## 2017-03-30 DIAGNOSIS — I700 Atherosclerosis of aorta: Secondary | ICD-10-CM | POA: Insufficient documentation

## 2017-03-30 DIAGNOSIS — R0600 Dyspnea, unspecified: Secondary | ICD-10-CM

## 2017-03-30 MED ORDER — AMBULATORY NON FORMULARY MEDICATION: 0 refills | Status: DC

## 2017-03-30 MED ORDER — AMBULATORY NON FORMULARY MEDICATION: 99 refills | Status: DC

## 2017-03-30 NOTE — Progress Notes (Signed)
Subjective:    Patient ID: Shelby Thomas, female    DOB: 11/05/1945, 71 y.o.   MRN: [id number]  HPI 71 year old female comes in today to follow-up on failure/shortness of breath.  I saw her about 4 weeks ago and at the time her pulse ox was just slightly low and she was feeling very short of breath with activity.  I felt like she was up 6 pounds and was volume overloaded so we put her on Lasix daily.  And I saw her about 3 weeks ago she was actually feeling much better after diuresis.  She is down 9 pounds.  And feeling about 75% better.  Actually asked her to come back in today for a walk test to see if she was still dropping oxygen level with ambulation.  Reports that she is been taking her Lasix and potassium daily.  Her weight is actually up 5 pounds from I last saw her and she is feeling more short of breath.  She has been wearing her CPAP consistently at night.  She does take a daily aspirin as well.  Recently had a chest CT to rule out any major pulmonary lesions etc. which was normal except showed some aortic atherosclerosis.  Review of Systems   BP (!) 146/76   Pulse (!) 111   Temp 97.6 F (36.4 C) (Oral)   Ht 5\' 3"  (1.6 m)   Wt 231 lb (104.8 kg)   SpO2 (!) 87%   BMI 40.92 kg/m     Allergies  Allergen Reactions  . Ramipril     REACTION: cough  . Tramadol Other (See Comments)    Nausea    Past Medical History:  Diagnosis Date  . CHF (congestive heart failure) (HCC)    mild  . Diabetes mellitus (HCC)   . Foraminal stenosis of lumbar region    L4--L5  . GERD (gastroesophageal reflux disease)   . Heart disease, congenital    large ASD, Sees Dr. Elmarie ShileyGoodfellow/card  . Hiatal hernia   . Hyperlipidemia    dyslipidemia  . Hypertension    pulmonary/on nighttime O2  . 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 OSA (obstructive sleep apnea)   . Pneumonia 2006  . Scoliosis    severe  . Spine misalignment    T spine 80% to right, L-Spine 55% to left    Past Surgical  History:  Procedure Laterality Date  . CARDIAC SURGERY     repair of ASD  . ESOPHAGEAL DILATION  04-09   dilation/Dr. Inge Riseavid Wood  . REPLACEMENT TOTAL KNEE Left 12/08/13   Dr. Dossie Arbouralvin McCabe  . TOTAL HIP ARTHROPLASTY Left     Social History   Socioeconomic History  . Marital status: Married    Spouse name: Not on file  . Number of children: 2  . Years of education: Not on file  . Highest education level: Not on file  Social Needs  . Financial resource strain: Not on file  . Food insecurity - worry: Not on file  . Food insecurity - inability: Not on file  . Transportation needs - medical: Not on file  . Transportation needs - non-medical: Not on file  Occupational History  . Not on file  Tobacco Use  . Smoking status: Never Smoker  . Smokeless tobacco: Never Used  Substance and Sexual Activity  . Alcohol use: No  . Drug use: Not on file  . 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   Other Topics Concern  . Not on file  Social History Narrative  . Not on file    Family History  Problem Relation Age of Onset  . Diabetes Mother   . Stroke Mother   . CAD Mother        CABG in her 3570s    Outpatient Encounter Medications as of 03/30/2017  Medication Sig  . AMBULATORY NON FORMULARY MEDICATION Medication Name: Free Style Insu[employer]x test strips.  Use daily as directed  . AMBULATORY NON FORMULARY MEDICATION Medication Name: wheelchair  . AMBULATORY NON FORMULARY MEDICATION Medication Name: Oxygen with CPAP qhs at 2L/min. Diagnosis: Pulmonary Hypertension, Dyspnea on Exertion  . AMBULATORY NON FORMULARY MEDICATION Medication Name: oxygen via [place] in daytime, stationary and portable requested, 2L/min. Diagnosis: pulmonary hypertension, dyspnea on exertion  . aspirin 81 MG tablet Take 81 mg by mouth daily.    . furosemide (LASIX) 20 MG tablet Take 1 tablet (20 mg total) by mouth daily.  Marland Kitchen.  glucosamine-chondroitin (GLUCOSAMINE-CHONDROITIN DS) 500-400 MG tablet Take by mouth.  . losartan (COZAAR) 25 MG tablet TAKE ONE TABLET BY MOUTH DAILY  . metFORMIN (GLUCOPHAGE) 500 MG tablet TAKE ONE TABLET BY MOUTH DAILY WITH BREAKFAST  . Multiple Vitamin (MULTIVITAMIN) capsule Take 1 capsule by mouth daily.    Marland Kitchen. omeprazole (PRILOSEC) 40 MG capsule TAKE ONE CAPSULE BY MOUTH DAILY  . potassium chloride (MICRO-K) 10 MEQ CR capsule TAKE ONE TABLET BY MOUTH ONCE DAILY AS NEEDED WHEN TAKING FUROSEMIDE  . simvastatin (ZOCOR) 40 MG tablet Take 1 tablet (40 mg total) by mouth every evening.  . [DISCONTINUED] AMBULATORY NON FORMULARY MEDICATION Medication Name: oxygen via [place] in daytime, stationary and portable requested, 2L/min. Diagnosis: pulmonary hypertension, dyspnea on exertion   No facility-administered encounter medications on file as of 03/30/2017.           Objective:   Physical Exam  Constitutional: She is oriented to person, place, and time. She appears well-developed and well-nourished.  HENT:  Head: Normocephalic and atraumatic.  Cardiovascular: Normal rate, regular rhythm and normal heart sounds.  Pulmonary/Chest: Effort normal and breath sounds normal.  Neurological: She is alert and oriented to person, place, and time.  Skin: Skin is warm and dry.  Psychiatric: She has a normal mood and affect. Her behavior is normal.        Assessment & Plan:  Heart failure - continue Lasix 40 mg daily.  We will have her double up on her medication today and check her weights daily at home.  Possibly some increase in dietary salt over the holiday.  Tachycardia-2 perform EKG today just to make sure that she was not in A. fib.  It did show a rate of 88 bpm by the time we did the EKG with normal sinus rhythm and right bundle branch block which has been persistent since her cardiologist did an EKG a couple of years ago.  Also showing some left anterior fascicular block with LVH.  Hypoxemia-we  will go ahead and start home oxygen therapy with 2 L.  At rest she eventually comes up to 92% but it fluctuates even at rest.  Did not feel it was safe to do a walk test while here today.  Aortic atherosclerosis-seen on recent chest CT.  Currently on simvastatin 40 mg daily.

## 2017-03-30 NOTE — Telephone Encounter (Signed)
Pt called and wanted an Rx for a walker. Rx was printed and is waiting upfront for pick-up per Dr. Linford ArnoldMetheney.

## 2017-03-30 NOTE — Progress Notes (Signed)
Shelby Thomas was here for a walk test. Her results were:  O2 w/o Oxygen @ rest: 87%. Did not do any further testing due to her oxygen being below 88%.Shelby PacasBarkley, Shelby [place]

## 2017-03-30 NOTE — Patient Instructions (Addendum)
Take an extra tab of Lasix today and then start weighing herself daily at home.call me with daily weight on Thursday.   I am going to get you back in with Dr. Jens Somrenshaw.   They should call you about the oxygen.

## 2017-04-01 NOTE — Progress Notes (Signed)
HPI: FU dyspnea. Patient has had previous repair of an ASD when she was 71 years old at [facility]. Echocardiogram in January of 2015 in [place] showed normal LV function, abnormal diastolic function, moderate left atrial enlargement, moderate right ventricular enlargement, mild right atrial enlargement and moderately severe pulmonary hypertension. Laboratories in March of 2015 showed a sedimentation rate of 16, normal rheumatoid factor and normal ANA titer. Pulmonary function tests in April of 2015 suggested an interstitial process. She was referred to pulmonary in [place]. Pulmonary hypertension felt secondary to obstructive sleep apnea and scoliosis. Repeat echocardiogram 6/15 showed normal LV function, prior ASD repair and no residual shunt with negative saline microcavitation study. Nuclear study in May 2016 showed ejection fraction 53%. There was inferior lateral infarct but no ischemia. Chest CT October 2018 showed coronary calcification, severe dextroscoliosis of thoracic spine and aortic atherosclerosis. Patient recently placed on Lasix for congestive heart failure. Since last seen, she has dyspnea on exertion but no orthopnea or PND. She does have pedal edema. No chest pain or syncope.  Current Outpatient Medications  Medication Sig Dispense Refill  . AMBULATORY NON FORMULARY MEDICATION Medication Name: Free Style InsuLinx test strips.  Use daily as directed 50 strip 12  . AMBULATORY NON FORMULARY MEDICATION Medication Name: wheelchair 1 each 0  . AMBULATORY NON FORMULARY MEDICATION Medication Name: Oxygen with CPAP qhs at 2L/min. Diagnosis: Pulmonary Hypertension, Dyspnea on Exertion 1 Units prn  . AMBULATORY NON FORMULARY MEDICATION Medication Name: Scale to monitor daily weight for heart failure. 1 Units 0  . AMBULATORY NON FORMULARY MEDICATION Rolling Walker. Diagnosis: R06.02 1 each 0  . AMBULATORY NON FORMULARY MEDICATION Medication Name: oxygen via [place] in daytime,  stationary and portable requested, 2L/min. Diagnosis: pulmonary hypertension, dyspnea on exertion 1 Units prn  . aspirin 81 MG tablet Take 81 mg by mouth daily.      . furosemide (LASIX) 20 MG tablet Take 1 tablet (20 mg total) by mouth daily. 30 tablet 1  . losartan (COZAAR) 25 MG tablet TAKE ONE TABLET BY MOUTH DAILY 90 tablet 1  . metFORMIN (GLUCOPHAGE) 500 MG tablet TAKE ONE TABLET BY MOUTH DAILY WITH BREAKFAST 30 tablet 11  . Multiple Vitamin (MULTIVITAMIN) capsule Take 1 capsule by mouth daily.      Marland Kitchen. omeprazole (PRILOSEC) 40 MG capsule TAKE ONE CAPSULE BY MOUTH DAILY 30 capsule 11  . potassium chloride (MICRO-K) 10 MEQ CR capsule TAKE ONE TABLET BY MOUTH ONCE DAILY AS NEEDED WHEN TAKING FUROSEMIDE 30 capsule 11  . simvastatin (ZOCOR) 40 MG tablet Take 1 tablet (40 mg total) by mouth every evening. 90 tablet 3   No current facility-administered medications for this visit.      Past Medical History:  Diagnosis Date  . CHF (congestive heart failure) (HCC)    mild  . Diabetes mellitus (HCC)   . Foraminal stenosis of lumbar region    L4--L5  . GERD (gastroesophageal reflux disease)   . Heart disease, congenital    large ASD, Sees Dr. Elmarie ShileyGoodfellow/card  . Hiatal hernia   . Hyperlipidemia    dyslipidemia  . Hypertension    pulmonary/on nighttime O2  . 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 OSA (obstructive sleep apnea)   . Pneumonia 2006  . Scoliosis    severe  . Spine misalignment    T spine 80% to right, L-Spine 55% to left    Past Surgical History:  Procedure Laterality Date  . CARDIAC  SURGERY     repair of ASD  . ESOPHAGEAL DILATION  04-09   dilation/Dr. Inge Riseavid Wood  . REPLACEMENT TOTAL KNEE Left 12/08/13   Dr. Dossie Arbouralvin McCabe  . TOTAL HIP ARTHROPLASTY Left     Social History   Socioeconomic History  . Marital status: Married    Spouse name: Not on file  . Number of children: 2  . Years of education: Not on file  . Highest education level: Not on file    Social Needs  . Financial resource strain: Not on file  . Food insecurity - worry: Not on file  . Food insecurity - inability: Not on file  . Transportation needs - medical: Not on file  . Transportation needs - non-medical: Not on file  Occupational History  . Not on file  Tobacco Use  . Smoking status: Never Smoker  . Smokeless tobacco: Never Used  Substance and Sexual Activity  . Alcohol use: No  . Drug use: Not on file  . 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   Other Topics Concern  . Not on file  Social History Narrative  . Not on file    Family History  Problem Relation Age of Onset  . Diabetes Mother   . Stroke Mother   . CAD Mother        CABG in her 8470s    ROS:  Left hip and knee pain but no fevers or chills, productive cough, hemoptysis, dysphasia, odynophagia, melena, hematochezia, dysuria, hematuria, rash, seizure activity, orthopnea, PND, claudication. Remaining systems are negative.  Physical Exam: Well-developed well-nourished in no acute distress.  Skin is warm and dry.  HEENT is normal.  Neck is supple.  Chest is clear to auscultation with normal expansion.  Cardiovascular exam is regular rate and rhythm.  Abdominal exam nontender or distended. No masses palpated. Extremities show 2+ edema. neuro grossly intact  ECG- 03/31/2017-sinus rhythm, left anterior fascicular block, right bundle branch block, left ventricular hypertrophy, cannot rule out prior septal infarct.personally reviewed  A/P  1 pulmonary hypertension-this is felt to be secondary to scoliosis (and restriction) and obstructive sleep apnea per pulmonary in [place]. She has had prior echocardiogram with microcavitation which was negative for residual shunt following previous ASD repair. Continue CPAP. Not wearing home O2 and I encouraged her to do this.  2 hypertension-blood pressure is  borderline; DC cozaar and follow.  3 hyperlipidemia-continue statin. Lipids and liver monitored by primary care.  4 chronic diastolic congestive heart failure-patient volume overloaded. This may be secondary to pulmonary hypertension with secondary right heart failure. Repeat echocardiogram. Increase Lasix to 40 mg twice a day. Check potassium and renal function in one week. Patient instructed on fluid restriction and low sodium diet.  Olga MillersBrian Tenille Morrill, MD

## 2017-04-03 ENCOUNTER — Other Ambulatory Visit: Payer: Self-pay

## 2017-04-03 ENCOUNTER — Telehealth: Payer: Self-pay

## 2017-04-03 DIAGNOSIS — R0602 Shortness of breath: Secondary | ICD-10-CM

## 2017-04-03 MED ORDER — AMBULATORY NON FORMULARY MEDICATION: 0 refills | Status: DC

## 2017-04-03 NOTE — Progress Notes (Signed)
Order reprinted per Pt preference on walker style.

## 2017-04-03 NOTE — Telephone Encounter (Signed)
PT came in today to pick up script for walker. She also wanted you to know she has not received a call from the oxygen place.

## 2017-04-04 LAB — BASIC METABOLIC PANEL WITH GFR
BUN: 13 mg/dL (ref 7–25)
CALCIUM: 9.2 mg/dL (ref 8.6–10.4)
CO2: 33 mmol/L — ABNORMAL HIGH (ref 20–32)
CREATININE: 0.73 mg/dL (ref 0.60–0.93)
Chloride: 101 mmol/L (ref 98–110)
GFR, EST AFRICAN AMERICAN: 97 mL/min/{1.73_m2} (ref >=60)
GFR, EST NON AFRICAN AMERICAN: 83 mL/min/{1.73_m2} (ref >=60)
Glucose, Bld: 114 mg/dL — ABNORMAL HIGH (ref 65–99)
Potassium: 4.5 mmol/L (ref 3.5–5.3)
Sodium: 141 mmol/L (ref 135–146)

## 2017-04-04 LAB — HEMOGLOBIN: HEMOGLOBIN: 11.2 g/dL — AB (ref 11.7–15.5)

## 2017-04-04 LAB — FERRITIN: Ferritin: 25 ng/mL (ref 20–288)

## 2017-04-04 LAB — IRON: IRON: 65 ug/dL (ref 45–160)

## 2017-04-06 NOTE — Progress Notes (Signed)
All labs are normal. 

## 2017-04-06 NOTE — Telephone Encounter (Signed)
Spoke w/pt advised that I would check into this for her .Shelby PacasBarkley, [name]

## 2017-04-08 NOTE — Telephone Encounter (Signed)
Patient called to check on the order for oxygen. I didn't see any order.

## 2017-04-09 ENCOUNTER — Other Ambulatory Visit: Payer: Self-pay | Admitting: *Deleted

## 2017-04-09 DIAGNOSIS — R0609 Other forms of dyspnea: Principal | ICD-10-CM

## 2017-04-09 DIAGNOSIS — I270 Primary pulmonary hypertension: Secondary | ICD-10-CM

## 2017-04-09 DIAGNOSIS — R0600 Dyspnea, unspecified: Secondary | ICD-10-CM

## 2017-04-09 MED ORDER — AMBULATORY NON FORMULARY MEDICATION: 99 refills | Status: DC

## 2017-04-09 NOTE — Telephone Encounter (Signed)
Pt's orders for O2 faxed to [facility]. Confirmation received. Called pt and informed her that this has been done.Loralee PacasBarkley, Shailen Thielen [place]

## 2017-04-09 NOTE — Telephone Encounter (Signed)
Order reprinted and faxed to [facility].Loralee PacasBarkley, Deontae Robson [place]

## 2017-04-15 ENCOUNTER — Encounter: Payer: Self-pay | Admitting: Cardiology

## 2017-04-15 ENCOUNTER — Ambulatory Visit: Payer: Medicare Other | Admitting: Cardiology

## 2017-04-15 VITALS — BP 116/66 | HR 91 | Ht 64.0 in | Wt 231.0 lb

## 2017-04-15 DIAGNOSIS — E7800 Pure hypercholesterolemia, unspecified: Secondary | ICD-10-CM

## 2017-04-15 DIAGNOSIS — I272 Pulmonary hypertension, unspecified: Secondary | ICD-10-CM | POA: Diagnosis not present

## 2017-04-15 DIAGNOSIS — I10 Essential (primary) hypertension: Secondary | ICD-10-CM

## 2017-04-15 DIAGNOSIS — I5032 Chronic diastolic (congestive) heart failure: Secondary | ICD-10-CM

## 2017-04-15 MED ORDER — FUROSEMIDE 40 MG PO TABS: 40.0000 mg | ORAL_TABLET | Freq: Two times a day (BID) | ORAL | 3 refills | Status: DC

## 2017-04-15 NOTE — Patient Instructions (Signed)
Medication Instructions:   STOP LOSARTAN  INCREASE FUROSEMIDE TO 40 MG TWICE DAILY= 2 OF THE 20 MG TABLETS TWICE DAILY  Labwork:  Your physician recommends that you return for lab work in: ONE WEEK  Follow-Up:  Your physician recommends that you schedule a follow-up appointment in: 8 WEEKS WITH DR Jens SomRENSHAW   If you need a refill on your cardiac medications before your next appointment, please call your pharmacy.

## 2017-04-17 ENCOUNTER — Ambulatory Visit: Payer: Medicare Other | Admitting: Family Medicine

## 2017-05-28 ENCOUNTER — Encounter: Payer: Self-pay | Admitting: *Deleted

## 2017-06-19 ENCOUNTER — Telehealth: Payer: Self-pay

## 2017-06-19 NOTE — Telephone Encounter (Signed)
Left a message stating she was returning Dr Shelah LewandowskyMetheney's call. I didn't see where we had called her.

## 2017-06-22 NOTE — Telephone Encounter (Signed)
No documentation noted will close note.Marland Kitchen.Marland Kitchen.Heath GoldBarkley, Tmya Wigington Lynetta, CMA

## 2017-06-24 NOTE — Progress Notes (Signed)
History     HPI: FU dyspnea. Patient has had previous repair of an ASD when she was 72 years old at [facility]. Echocardiogram in January of 2015 in [place] showed normal LV function, abnormal diastolic function, moderate left atrial enlargement, moderate right ventricular enlargement, mild right atrial enlargement and moderately severe pulmonary hypertension. Laboratories in March of 2015 showed a sedimentation rate of 16, normal rheumatoid factor and normal ANA titer. Pulmonary function tests in April of 2015 suggested an interstitial process. She was referred to pulmonary in [place]. Pulmonary hypertension felt secondary to obstructive sleep apnea and scoliosis. Repeat echocardiogram 6/15 showed normal LV function, prior ASD repair and no residual shunt with negative saline microcavitation study. Nuclear study in May 2016 showed ejection fraction 53%. There was inferior lateral infarct but no ischemia. Chest CT October 2018 showed coronary calcification, severe dextroscoliosis of thoracic spine and aortic atherosclerosis.  Echocardiogram ordered at last office visit but not performed.  Since last seen,  patient states her dyspnea is improving.  She denies orthopnea, PND, chest pain or syncope.  She also states edema has improved.  Current Outpatient Medications  Medication Sig Dispense Refill  . AMBULATORY NON FORMULARY MEDICATION Medication Name: Free Style InsuLinx test strips.  Use daily as directed 50 strip 12  . AMBULATORY NON FORMULARY MEDICATION Medication Name: wheelchair 1 each 0  . AMBULATORY NON FORMULARY MEDICATION Medication Name: Oxygen with CPAP qhs at 2L/min. Diagnosis: Pulmonary Hypertension, Dyspnea on Exertion 1 Units prn  . AMBULATORY NON FORMULARY MEDICATION Medication Name: Scale to monitor daily weight for heart failure. 1 Units 0  . AMBULATORY NON FORMULARY MEDICATION Rolling Walker. Diagnosis: R06.02 1 each 0  . AMBULATORY NON FORMULARY MEDICATION Medication  Name: oxygen via [place] in daytime, stationary and portable requested, 2L/min. Diagnosis: pulmonary hypertension, dyspnea on exertion 1 Units prn  . aspirin 81 MG tablet Take 81 mg by mouth daily.      . furosemide (LASIX) 40 MG tablet Take 1 tablet (40 mg total) by mouth 2 (two) times daily. 180 tablet 3  . metFORMIN (GLUCOPHAGE) 500 MG tablet TAKE ONE TABLET BY MOUTH DAILY WITH BREAKFAST 30 tablet 11  . Multiple Vitamin (MULTIVITAMIN) capsule Take 1 capsule by mouth daily.      Marland Kitchen omeprazole (PRILOSEC) 40 MG capsule TAKE ONE CAPSULE BY MOUTH DAILY 30 capsule 11  . potassium chloride (MICRO-K) 10 MEQ CR capsule TAKE ONE TABLET BY MOUTH ONCE DAILY AS NEEDED WHEN TAKING FUROSEMIDE 30 capsule 11  . simvastatin (ZOCOR) 40 MG tablet Take 1 tablet (40 mg total) by mouth every evening. 90 tablet 3   No current facility-administered medications for this visit.      Past Medical History:  Diagnosis Date  . CHF (congestive heart failure) (HCC)    mild  . Diabetes mellitus (HCC)   . Foraminal stenosis of lumbar region    L4--L5  . GERD (gastroesophageal reflux disease)   . Heart disease, congenital    large ASD, Sees Dr. Elmarie Shiley  . Hiatal hernia   . Hyperlipidemia    dyslipidemia  . Hypertension    pulmonary/on nighttime O2  . 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 OSA (obstructive sleep apnea)   . Pneumonia 2006  . Scoliosis    severe  . Spine misalignment    T spine 80% to right, L-Spine 55% to left    Past Surgical History:  Procedure Laterality Date  . CARDIAC SURGERY     repair of  ASD  . ESOPHAGEAL DILATION  04-09   dilation/Dr. Inge Riseavid Wood  . REPLACEMENT TOTAL KNEE Left 12/08/13   Dr. Dossie Arbouralvin McCabe  . TOTAL HIP ARTHROPLASTY Left     Social History   Socioeconomic History  . Marital status: Married    Spouse name: Not on file  . Number of children: 2  . Years of education: Not on file  . Highest education level: Not on file  Social Needs  . Financial resource  strain: Not on file  . Food insecurity - worry: Not on file  . Food insecurity - inability: Not on file  . Transportation needs - medical: Not on file  . Transportation needs - non-medical: Not on file  Occupational History  . Not on file  Tobacco Use  . Smoking status: Never Smoker  . Smokeless tobacco: Never Used  Substance and Sexual Activity  . Alcohol use: No  . Drug use: Not on file  . 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   Other Topics Concern  . Not on file  Social History Narrative  . Not on file    Family History  Problem Relation Age of Onset  . Diabetes Mother   . Stroke Mother   . CAD Mother        CABG in her 4270s    ROS: no fevers or chills, productive cough, hemoptysis, dysphasia, odynophagia, melena, hematochezia, dysuria, hematuria, rash, seizure activity, orthopnea, PND, claudication. Remaining systems are negative.  Physical Exam: Well-developed well-nourished in no acute distress.  Skin is warm and dry.  HEENT is normal.  Neck is supple.  Chest is clear to auscultation with normal expansion.  Cardiovascular exam is regular rate and rhythm.  Abdominal exam nontender or distended. No masses palpated. Extremities show trace to 1+ edema. neuro grossly intact  A/P  1 pulmonary hypertension-this is felt to be related to restrictive physiology from scoliosis and obstructive sleep apnea per prior pulmonary evaluation.  Continue CPAP.  I encouraged her to wear home oxygen as outlined previously.  Note previous echocardiogram with microcavitation was negative for residual shunting following ASD repair.  2 hypertension-blood pressure is controlled.  Continue present medications.  3 chronic diastolic congestive heart failure-there may also be a component of right heart failure from pulmonary hypertension.  She did not return for her previous echocardiogram as  ordered.  We will repeat.  Continue Lasix at present dose. Patient instructed on low-sodium diet and fluid restriction.  4 hyperlipidemia-continue statin.  Olga MillersBrian Feras Gardella, MD

## 2017-06-27 LAB — BASIC METABOLIC PANEL
BUN / CREAT RATIO: 13 (calc) (ref 6–22)
BUN: 12 mg/dL (ref 7–25)
CO2: 32 mmol/L (ref 20–32)
CREATININE: 0.94 mg/dL — AB (ref 0.60–0.93)
Calcium: 9.7 mg/dL (ref 8.6–10.4)
Chloride: 101 mmol/L (ref 98–110)
GLUCOSE: 144 mg/dL — AB (ref 65–99)
Potassium: 4.2 mmol/L (ref 3.5–5.3)
SODIUM: 143 mmol/L (ref 135–146)

## 2017-06-29 ENCOUNTER — Encounter: Payer: Self-pay | Admitting: *Deleted

## 2017-07-01 ENCOUNTER — Encounter: Payer: Self-pay | Admitting: Cardiology

## 2017-07-01 ENCOUNTER — Ambulatory Visit (INDEPENDENT_AMBULATORY_CARE_PROVIDER_SITE_OTHER): Payer: Medicare Other | Admitting: Cardiology

## 2017-07-01 VITALS — BP 124/68 | HR 91 | Ht 64.0 in | Wt 220.1 lb

## 2017-07-01 DIAGNOSIS — I38 Endocarditis, valve unspecified: Secondary | ICD-10-CM

## 2017-07-01 DIAGNOSIS — I272 Pulmonary hypertension, unspecified: Secondary | ICD-10-CM

## 2017-07-01 DIAGNOSIS — I10 Essential (primary) hypertension: Secondary | ICD-10-CM

## 2017-07-01 DIAGNOSIS — I5032 Chronic diastolic (congestive) heart failure: Secondary | ICD-10-CM

## 2017-07-01 NOTE — Patient Instructions (Signed)
Medication Instructions:   NO CHANGE  Testing/Procedures:  Your physician has requested that you have an echocardiogram. Echocardiography is a painless test that uses sound waves to create images of your heart. It provides your doctor with information about the size and shape of your heart and how well your heart's chambers and valves are working. This procedure takes approximately one hour. There are no restrictions for this procedure.  [address] [place]=1ST FLOOR IMAGING DEPARTMENT  Follow-Up:  Your physician wants you to follow-up in: 6 MONTHS WITH DR Jens SomRENSHAW You will receive a reminder letter in the mail two months in advance. If you don't receive a letter, please call our office to schedule the follow-up appointment.   If you need a refill on your cardiac medications before your next appointment, please call your pharmacy.

## 2017-07-24 ENCOUNTER — Ambulatory Visit (HOSPITAL_BASED_OUTPATIENT_CLINIC_OR_DEPARTMENT_OTHER)
Admission: RE | Admit: 2017-07-24 | Discharge: 2017-07-24 | Disposition: A | Discharge status: Home / Self Care | Payer: Medicare Other | Source: Ambulatory Visit | Attending: Cardiology | Admitting: Cardiology

## 2017-07-24 ENCOUNTER — Emergency Department (HOSPITAL_BASED_OUTPATIENT_CLINIC_OR_DEPARTMENT_OTHER): Admission: EM | Admit: 2017-07-24 | Payer: Medicare Other | Source: Home / Self Care

## 2017-07-24 DIAGNOSIS — I38 Endocarditis, valve unspecified: Secondary | ICD-10-CM | POA: Insufficient documentation

## 2017-07-24 DIAGNOSIS — I110 Hypertensive heart disease with heart failure: Secondary | ICD-10-CM | POA: Insufficient documentation

## 2017-07-24 DIAGNOSIS — E785 Hyperlipidemia, unspecified: Secondary | ICD-10-CM | POA: Insufficient documentation

## 2017-07-24 DIAGNOSIS — I5032 Chronic diastolic (congestive) heart failure: Secondary | ICD-10-CM

## 2017-07-24 NOTE — Progress Notes (Signed)
Echocardiogram 2D Echocardiogram has been performed.  Dorothey BasemanReel, Lonney Revak M 07/24/2017, 10:30 AM

## 2017-08-05 ENCOUNTER — Encounter: Payer: Self-pay | Admitting: *Deleted

## 2017-08-18 ENCOUNTER — Other Ambulatory Visit: Payer: Self-pay | Admitting: Osteopathic Medicine

## 2017-09-08 ENCOUNTER — Other Ambulatory Visit: Payer: Self-pay | Admitting: Family Medicine

## 2017-10-12 ENCOUNTER — Other Ambulatory Visit: Payer: Self-pay | Admitting: Family Medicine

## 2017-10-28 ENCOUNTER — Other Ambulatory Visit: Payer: Self-pay | Admitting: Family Medicine

## 2017-10-30 ENCOUNTER — Other Ambulatory Visit: Payer: Self-pay | Admitting: Family Medicine

## 2017-11-03 ENCOUNTER — Other Ambulatory Visit: Payer: Self-pay | Admitting: *Deleted

## 2017-11-03 MED ORDER — SIMVASTATIN 40 MG PO TABS: 40.0000 mg | ORAL_TABLET | Freq: Every day | ORAL | 0 refills | Status: DC

## 2017-11-03 NOTE — Progress Notes (Signed)
Refill sent. Pt made appointment

## 2017-11-25 ENCOUNTER — Encounter: Payer: Self-pay | Admitting: Family Medicine

## 2017-11-25 ENCOUNTER — Ambulatory Visit: Payer: Medicare Other | Admitting: Family Medicine

## 2017-11-25 VITALS — BP 140/60 | HR 78 | Ht 64.0 in | Wt 225.0 lb

## 2017-11-25 DIAGNOSIS — Z1231 Encounter for screening mammogram for malignant neoplasm of breast: Secondary | ICD-10-CM

## 2017-11-25 DIAGNOSIS — I10 Essential (primary) hypertension: Secondary | ICD-10-CM

## 2017-11-25 DIAGNOSIS — T17308A Unspecified foreign body in larynx causing other injury, initial encounter: Secondary | ICD-10-CM | POA: Diagnosis not present

## 2017-11-25 DIAGNOSIS — E669 Obesity, unspecified: Secondary | ICD-10-CM | POA: Diagnosis not present

## 2017-11-25 DIAGNOSIS — R6881 Early satiety: Secondary | ICD-10-CM

## 2017-11-25 DIAGNOSIS — E1169 Type 2 diabetes mellitus with other specified complication: Secondary | ICD-10-CM | POA: Diagnosis not present

## 2017-11-25 LAB — POCT GLYCOSYLATED HEMOGLOBIN (HGB A1C): Hemoglobin A1C: 8.2 % — AB (ref 4.0–5.6)

## 2017-11-25 LAB — POCT UA - MICROALBUMIN
CREATININE, POC: 100 mg/dL
Microalbumin Ur, POC: 30 mg/L

## 2017-11-25 LAB — COMPLETE METABOLIC PANEL WITH GFR
AG Ratio: 1.3 (calc) (ref 1.0–2.5)
ALBUMIN MSPROF: 3.9 g/dL (ref 3.6–5.1)
ALKALINE PHOSPHATASE (APISO): 94 U/L (ref 33–130)
ALT: 12 U/L (ref 6–29)
AST: 15 U/L (ref 10–35)
BUN: 11 mg/dL (ref 7–25)
CO2: 33 mmol/L — AB (ref 20–32)
CREATININE: 0.77 mg/dL (ref 0.60–0.93)
Calcium: 9.4 mg/dL (ref 8.6–10.4)
Chloride: 100 mmol/L (ref 98–110)
GFR, EST AFRICAN AMERICAN: 91 mL/min/{1.73_m2} (ref >=60)
GFR, Est Non African American: 78 mL/min/{1.73_m2} (ref >=60)
GLOBULIN: 3.1 g/dL (ref 1.9–3.7)
Glucose, Bld: 154 mg/dL — ABNORMAL HIGH (ref 65–99)
Potassium: 4.2 mmol/L (ref 3.5–5.3)
SODIUM: 142 mmol/L (ref 135–146)
TOTAL PROTEIN: 7 g/dL (ref 6.1–8.1)
Total Bilirubin: 0.4 mg/dL (ref 0.2–1.2)

## 2017-11-25 LAB — LIPID PANEL
CHOLESTEROL: 192 mg/dL (ref <200)
HDL: 68 mg/dL (ref >50)
LDL CHOLESTEROL (CALC): 104 mg/dL — AB
Non-HDL Cholesterol (Calc): 124 mg/dL (calc) (ref <130)
TRIGLYCERIDES: 100 mg/dL (ref <150)
Total CHOL/HDL Ratio: 2.8 (calc) (ref <5.0)

## 2017-11-25 LAB — TSH: TSH: 0.78 m[IU]/L (ref 0.40–4.50)

## 2017-11-25 MED ORDER — AMBULATORY NON FORMULARY MEDICATION: 0 refills | Status: DC

## 2017-11-25 NOTE — Progress Notes (Signed)
Subjective:    CC: D<, HTN  HPI:  Diabetes - no hypoglycemic events. No wounds or sores that are not healing well. No increased thirst or urination. Checking glucose at home. Taking medications as prescribed without any side effects.  She says she really does not eat much but seems to be gaining weight.  She is also not as mobile as she was previously.  She does admit to eating candy and drinking sweet tea and juice.  Hypertension- Pt denies chest pain, SOB, dizziness, or heart palpitations.  Taking meds as directed w/o problems.  Denies medication side effects.    She is also reports that she is been having significantly decreased appetite as well as some early satiety.  She says often she will just eat a little bit and then not want to finish.  She says then she will feel like the food is, coming back up in her esophagus and will start choking.  She denies any actual problems swallowing or going down when she initially eats.  She also notes that she was having some blood in her stool when she was taking iron but since stopping the iron the blood in the stool has seemed to resolve.  She is Artie taking omeprazole daily.  Obstructive sleep apnea-we recently tried to add oxygen to her CPAP.  But she says she was feeling worse when she would use it and so she is just going back to just using the CPAP without the oxygen and feels much better.  She would like the [employer] company to come and pick it up.  Past medical history, Surgical history, Family history not pertinant except as noted below, Social history, Allergies, and medications have been entered into the medical record, reviewed, and corrections made.   Review of Systems: No fevers, chills, night sweats, weight loss, chest pain, or shortness of breath.   Objective:    General: Well Developed, well nourished, and in no acute distress.  Neuro: Alert and oriented x3, extra-ocular muscles intact, sensation grossly intact.  HEENT: Normocephalic,  atraumatic  Skin: Warm and dry, no rashes. Cardiac: Regular rate and rhythm, no murmurs rubs or gallops, no lower extremity edema.  Respiratory: Clear to auscultation bilaterally. Not using accessory muscles, speaking in full sentences.   Impression and Recommendations:    DM -controlled.  Heme globin A1c up to 8.2 today.  Discussed cutting back on juice, sweet tea and candy.  And portion controlling carbohydrate intake.  Also can try increasing her metformin to 2 a day to see if she is able to tolerate that I will see her back in 3 months.  Up-to-date blood work today.  HTN -blood pressure borderline.  Will monitor.  She has gained some weight and that may be contributing.  Obstructive sleep apnea-we will call [employer] and have them pick up the oxygen and just continue with the CPAP.  Early satiety and choking after eating-we will refer to GI for further evaluation.   Encourage mammogram.

## 2017-11-27 ENCOUNTER — Other Ambulatory Visit: Payer: Self-pay

## 2017-11-27 ENCOUNTER — Other Ambulatory Visit: Payer: Self-pay | Admitting: Family Medicine

## 2017-11-27 MED ORDER — SIMVASTATIN 40 MG PO TABS: 40.0000 mg | ORAL_TABLET | Freq: Every day | ORAL | 1 refills | Status: DC

## 2017-12-01 ENCOUNTER — Other Ambulatory Visit: Payer: Self-pay | Admitting: Family Medicine

## 2017-12-18 ENCOUNTER — Telehealth: Payer: Self-pay

## 2017-12-18 NOTE — Telephone Encounter (Signed)
Shelby Thomas states she wants to D/C O2 with [facility]. Please advise.

## 2017-12-18 NOTE — Telephone Encounter (Signed)
Okay to place order to discontinue oxygen therapy. I thought we had already done this.

## 2017-12-18 NOTE — Telephone Encounter (Signed)
Order has been re-faxed to [facility] and received confirmation that it was received.

## 2017-12-28 ENCOUNTER — Other Ambulatory Visit: Payer: Self-pay | Admitting: Family Medicine

## 2017-12-28 DIAGNOSIS — M79606 Pain in leg, unspecified: Secondary | ICD-10-CM

## 2017-12-28 DIAGNOSIS — M7989 Other specified soft tissue disorders: Principal | ICD-10-CM

## 2018-02-18 ENCOUNTER — Encounter: Payer: Self-pay | Admitting: Family Medicine

## 2018-02-18 ENCOUNTER — Ambulatory Visit (INDEPENDENT_AMBULATORY_CARE_PROVIDER_SITE_OTHER): Payer: Medicare Other | Admitting: Family Medicine

## 2018-02-18 VITALS — BP 138/63 | HR 81 | Ht 64.0 in | Wt 221.0 lb

## 2018-02-18 DIAGNOSIS — E669 Obesity, unspecified: Secondary | ICD-10-CM | POA: Diagnosis not present

## 2018-02-18 DIAGNOSIS — I10 Essential (primary) hypertension: Secondary | ICD-10-CM | POA: Diagnosis not present

## 2018-02-18 DIAGNOSIS — E1169 Type 2 diabetes mellitus with other specified complication: Secondary | ICD-10-CM

## 2018-02-18 DIAGNOSIS — Z23 Encounter for immunization: Secondary | ICD-10-CM

## 2018-02-18 DIAGNOSIS — I700 Atherosclerosis of aorta: Secondary | ICD-10-CM | POA: Diagnosis not present

## 2018-02-18 LAB — POCT GLYCOSYLATED HEMOGLOBIN (HGB A1C): Hemoglobin A1C: 8 % — AB (ref 4.0–5.6)

## 2018-02-18 MED ORDER — METFORMIN HCL 500 MG PO TABS: 500.0000 mg | ORAL_TABLET | Freq: Two times a day (BID) | ORAL | 1 refills | Status: DC

## 2018-02-18 NOTE — Patient Instructions (Signed)
Can try Stevia - is it a sugar substitute but it does come from a natural plants as some people are able to tolerate it better.  You could try mixing that with your sweet tea. There is also beverage called a hint that does not have any artificial sugar or real sugar in it comes in several flavors that she could try.

## 2018-02-18 NOTE — Progress Notes (Signed)
Subjective:    CC: 61-month diabetic follow-up  HPI:  Diabetes - no hypoglycemic events. No wounds or sores that are not healing well. No increased thirst or urination. Checking glucose at home. Taking medications as prescribed without any side effects. Had her eye exam done.  He has cut out candy but is still drinking sweet tea.  She still only taking the metformin once a day.  Hypertension- Pt denies chest pain, SOB, dizziness, or heart palpitations.  Taking meds as directed w/o problems.  Denies medication side effects.    Follow-up aortic atherosclerosis-no recent chest pain or shortness of breath.  Past medical history, Surgical history, Family history not pertinant except as noted below, Social history, Allergies, and medications have been entered into the medical record, reviewed, and corrections made.   Review of Systems: No fevers, chills, night sweats, weight loss, chest pain, or shortness of breath.   Objective:    General: Well Developed, well nourished, and in no acute distress.  Neuro: Alert and oriented x3, extra-ocular muscles intact, sensation grossly intact.  HEENT: Normocephalic, atraumatic  Skin: Warm and dry, no rashes. Cardiac: Regular rate and rhythm, no murmurs rubs or gallops, no lower extremity edema.  Respiratory: Clear to auscultation bilaterally. Not using accessory muscles, speaking in full sentences.   Impression and Recommendations:    HTN - Well controlled. Continue current regimen. Follow up in  4 months.    DM -controlled though improved.  Hemoglobin A1c down to 8.0.  We discussed some strategies around getting that down even further.  She has not tried increasing her metformin so encouraged her to do so.  New prescription sent to pharmacy so she does not run short.  We also discussed cutting back on her sweet tea.  She has cut out candy so she has made some changes.  Will call for eye exam.    Aortic atherosclerosis-statin and blood pressure well  controlled.

## 2018-05-21 ENCOUNTER — Ambulatory Visit (INDEPENDENT_AMBULATORY_CARE_PROVIDER_SITE_OTHER): Payer: Medicare Other | Admitting: Family Medicine

## 2018-05-21 ENCOUNTER — Encounter: Payer: Self-pay | Admitting: Family Medicine

## 2018-05-21 VITALS — BP 128/46 | HR 78 | Ht 64.0 in | Wt 215.0 lb

## 2018-05-21 DIAGNOSIS — E1169 Type 2 diabetes mellitus with other specified complication: Secondary | ICD-10-CM

## 2018-05-21 DIAGNOSIS — I739 Peripheral vascular disease, unspecified: Secondary | ICD-10-CM | POA: Diagnosis not present

## 2018-05-21 DIAGNOSIS — I272 Pulmonary hypertension, unspecified: Secondary | ICD-10-CM | POA: Diagnosis not present

## 2018-05-21 DIAGNOSIS — I10 Essential (primary) hypertension: Secondary | ICD-10-CM

## 2018-05-21 DIAGNOSIS — E669 Obesity, unspecified: Secondary | ICD-10-CM

## 2018-05-21 DIAGNOSIS — Z1231 Encounter for screening mammogram for malignant neoplasm of breast: Secondary | ICD-10-CM

## 2018-05-21 DIAGNOSIS — G4734 Idiopathic sleep related nonobstructive alveolar hypoventilation: Secondary | ICD-10-CM

## 2018-05-21 DIAGNOSIS — I700 Atherosclerosis of aorta: Secondary | ICD-10-CM

## 2018-05-21 LAB — POCT GLYCOSYLATED HEMOGLOBIN (HGB A1C): Hemoglobin A1C: 7.1 % — AB (ref 4.0–5.6)

## 2018-05-21 MED ORDER — SIMVASTATIN 40 MG PO TABS: 40.0000 mg | ORAL_TABLET | Freq: Every day | ORAL | 3 refills | Status: DC

## 2018-05-21 NOTE — Assessment & Plan Note (Signed)
Can her statin and aspirin daily.  Due to refill simvastatin.

## 2018-05-21 NOTE — Assessment & Plan Note (Signed)
Well-controlled.  Continue current regimen. 

## 2018-05-21 NOTE — Assessment & Plan Note (Signed)
No recent complaints.  Activities are minimal as she is mostly wheelchair-bound.

## 2018-05-21 NOTE — Assessment & Plan Note (Signed)
Follows with cardiology.  Dr. Jens Som.

## 2018-05-21 NOTE — Progress Notes (Addendum)
Established Patient Office Visit  Subjective:  Patient ID: Shelby Thomas, female    DOB: [birth year]  Age: 73 y.o. MRN: [id number]  CC:  Chief Complaint  Patient presents with  . Diabetes  . Hypertension    HPI Shelby PeterDoris A Bieker presents for   Diabetes - no hypoglycemic events. No wounds or sores that are not healing well. No increased thirst or urination. Checking glucose at home. Taking medications as prescribed without any side effects.  Hypertension- Pt denies chest pain, SOB, dizziness, or heart palpitations.  Taking meds as directed w/o problems.  Denies medication side effects.    F/U PVD and pulmonary hypertension -recent blood work revealed elevated CO2 levels.  This can often occur when patients are not oxygenating well overnight and that her CO2 levels will rise during the day.  I really want her to have an overnight oximetry testing done.  Follow-up aortic atherosclerosis-no recent chest pain.  She is currently on a statin and aspirin.  Past Medical History:  Diagnosis Date  . CHF (congestive heart failure) (HCC)    mild  . Diabetes mellitus (HCC)   . Foraminal stenosis of lumbar region    L4--L5  . GERD (gastroesophageal reflux disease)   . Heart disease, congenital    large ASD, Sees Dr. Elmarie ShileyGoodfellow/card  . Hiatal hernia   . Hyperlipidemia    dyslipidemia  . Hypertension    pulmonary/on nighttime O2  . 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 OSA (obstructive sleep apnea)   . Pneumonia 2006  . Scoliosis    severe  . Spine misalignment    T spine 80% to right, L-Spine 55% to left    Past Surgical History:  Procedure Laterality Date  . CARDIAC SURGERY     repair of ASD  . ESOPHAGEAL DILATION  04-09   dilation/Dr. Inge Riseavid Wood  . REPLACEMENT TOTAL KNEE Left 12/08/13   Dr. Dossie Arbouralvin McCabe  . TOTAL HIP ARTHROPLASTY Left     Family History  Problem Relation Age of Onset  . Diabetes Mother   . Stroke Mother   . CAD Mother        CABG in her 470s     Social History   Socioeconomic History  . Marital status: Married    Spouse name: Not on file  . Number of children: 2  . Years of education: Not on file  . Highest education level: Not on file  Occupational History  . Not on file  Social Needs  . Financial resource strain: Not on file  . Food insecurity:    Worry: Not on file    Inability: Not on file  . Transportation needs:    Medical: Not on file    Non-medical: Not on file  Tobacco Use  . Smoking status: Never Smoker  . Smokeless tobacco: Never Used  Substance and Sexual Activity  . Alcohol use: No  . Drug use: Not on file  . 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   Lifestyle  . Physical activity:    Days per week: Not on file    Minutes per session: Not on file  . Stress: Not on file  Relationships  . Social connections:    Talks on phone: Not on file    Gets together: Not on file    Attends religious service: Not on file  Active member of club or organization: Not on file    Attends meetings of clubs or organizations: Not on file    Relationship status: Not on file  . Intimate partner violence:    Fear of current or ex partner: Not on file    Emotionally abused: Not on file    Physically abused: Not on file    Forced sexual activity: Not on file  Other Topics Concern  . Not on file  Social History Narrative  . Not on file    Outpatient Medications Prior to Visit  Medication Sig Dispense Refill  . AMBULATORY NON FORMULARY MEDICATION Medication Name: Free Style InsuLinx test strips.  Use daily as directed 50 strip 12  . AMBULATORY NON FORMULARY MEDICATION Medication Name: Oxygen with CPAP qhs at 2L/min. Diagnosis: Pulmonary Hypertension, Dyspnea on Exertion 1 Units prn  . aspirin 81 MG tablet Take 81 mg by mouth daily.      . furosemide (LASIX) 40 MG tablet Take 1 tablet (40 mg total) by mouth 2 (two)  times daily. 180 tablet 3  . metFORMIN (GLUCOPHAGE) 500 MG tablet Take 1 tablet (500 mg total) by mouth 2 (two) times daily with a meal. 180 tablet 1  . Multiple Vitamin (MULTIVITAMIN) capsule Take 1 capsule by mouth daily.      Marland Kitchen. omeprazole (PRILOSEC) 40 MG capsule TAKE ONE CAPSULE BY MOUTH DAILY 30 capsule 11  . potassium chloride (MICRO-K) 10 MEQ CR capsule TAKE ONE CAPSULE BY MOUTH ONCE DAILY AS NEEDED WHEN TAKING FUROSEMIDE 30 capsule 11  . simvastatin (ZOCOR) 40 MG tablet Take 1 tablet (40 mg total) by mouth daily. 90 tablet 1   No facility-administered medications prior to visit.     Allergies  Allergen Reactions  . Ramipril     REACTION: cough  . Tramadol Other (See Comments)    Nausea    ROS Review of Systems    Objective:    Physical Exam  Constitutional: She is oriented to person, place, and time. She appears well-developed and well-nourished.  HENT:  Head: Normocephalic and atraumatic.  Cardiovascular: Normal rate, regular rhythm and normal heart sounds.  Pulmonary/Chest: Effort normal and breath sounds normal.  Neurological: She is alert and oriented to person, place, and time.  Skin: Skin is warm and dry.  Psychiatric: She has a normal mood and affect. Her behavior is normal.    BP (!) 128/46   Pulse 78   Ht 5\' 4"  (1.626 m)   Wt 215 lb (97.5 kg)   SpO2 100%   BMI 36.90 kg/m  Wt Readings from Last 3 Encounters:  05/21/18 215 lb (97.5 kg)  02/18/18 221 lb (100.2 kg)  11/25/17 225 lb (102.1 kg)     Health Maintenance Due  Topic Date Due  . MAMMOGRAM  10/23/2017    There are no preventive care reminders to display for this patient.  Lab Results  Component Value Date   TSH 0.78 11/25/2017   Lab Results  Component Value Date   WBC 7.4 03/03/2017   HGB 11.2 (L) 04/03/2017   HCT 31.7 (L) 03/03/2017   MCV 86.4 03/03/2017   PLT 397 03/03/2017   Lab Results  Component Value Date   NA 143 05/21/2018   K 4.2 05/21/2018   CO2 33 (H) 05/21/2018    GLUCOSE 103 (H) 05/21/2018   BUN 11 05/21/2018   CREATININE 0.79 05/21/2018   BILITOT 0.4 11/25/2017   ALKPHOS 90 10/14/2016   AST 15 11/25/2017  ALT 12 11/25/2017   PROT 7.0 11/25/2017   ALBUMIN 3.7 10/14/2016   CALCIUM 9.5 05/21/2018   Lab Results  Component Value Date   CHOL 192 11/25/2017   Lab Results  Component Value Date   HDL 68 11/25/2017   Lab Results  Component Value Date   LDLCALC 104 (H) 11/25/2017   Lab Results  Component Value Date   TRIG 100 11/25/2017   Lab Results  Component Value Date   CHOLHDL 2.8 11/25/2017   Lab Results  Component Value Date   HGBA1C 7.1 (A) 05/21/2018      Assessment & Plan:   Problem List Items Addressed This Visit      Cardiovascular and Mediastinum   PVD (peripheral vascular disease) (HCC)    No recent complaints.  Activities are minimal as she is mostly wheelchair-bound.      Relevant Medications   simvastatin (ZOCOR) 40 MG tablet   Other Relevant Orders   BASIC METABOLIC PANEL WITH GFR (Completed)   Pulmonary hypertension (HCC)    Follows with cardiology.  Dr. Jens Som.      Relevant Medications   simvastatin (ZOCOR) 40 MG tablet   Other Relevant Orders   BASIC METABOLIC PANEL WITH GFR (Completed)   HYPERTENSION, BENIGN ESSENTIAL    Well-controlled.  Continue current regimen.      Relevant Medications   simvastatin (ZOCOR) 40 MG tablet   Other Relevant Orders   BASIC METABOLIC PANEL WITH GFR (Completed)   Aortic atherosclerosis (HCC)    Can her statin and aspirin daily.  Due to refill simvastatin.      Relevant Medications   simvastatin (ZOCOR) 40 MG tablet   Other Relevant Orders   BASIC METABOLIC PANEL WITH GFR (Completed)     Respiratory   Nocturnal hypoxia    Based on recent test results her oxygen levels were low for almost an hour under 89% while sleeping.  This does explain why her CO2 levels have been high.  She does have sleep apnea and wears CPAP but clearly needs supplemental  oxygen on her CPAP.  Will place order.        Endocrine   Diabetes mellitus type 2 in obese [facility]) - Primary    Much improved.  Hemoglobin A1c down to 7.1.  She is really made some great changes and cutting back on the sweet tea and candy and did go up on her metformin.  She really does not want to adjust the medicine today and is wanting to continue to work on her diet over the next 3 to 4 months.  Follow-up at that time.      Relevant Medications   simvastatin (ZOCOR) 40 MG tablet   Other Relevant Orders   POCT glycosylated hemoglobin (Hb A1C) (Completed)    Other Visit Diagnoses    Visit for screening mammogram       Relevant Orders   MM 3D SCREEN BREAST BILATERAL     Lab Results  Component Value Date   HGBA1C 7.1 (A) 05/21/2018     Meds ordered this encounter  Medications  . simvastatin (ZOCOR) 40 MG tablet    Sig: Take 1 tablet (40 mg total) by mouth daily.    Dispense:  90 tablet    Refill:  3    Follow-up: Return in about 3 months (around 08/20/2018) for Diabetes .    Nani Gasser, MD

## 2018-05-21 NOTE — Assessment & Plan Note (Signed)
Much improved.  Hemoglobin A1c down to 7.1.  She is really made some great changes and cutting back on the sweet tea and candy and did go up on her metformin.  She really does not want to adjust the medicine today and is wanting to continue to work on her diet over the next 3 to 4 months.  Follow-up at that time.

## 2018-05-22 LAB — BASIC METABOLIC PANEL WITH GFR
BUN: 11 mg/dL (ref 7–25)
CHLORIDE: 101 mmol/L (ref 98–110)
CO2: 33 mmol/L — ABNORMAL HIGH (ref 20–32)
Calcium: 9.5 mg/dL (ref 8.6–10.4)
Creat: 0.79 mg/dL (ref 0.60–0.93)
GFR, EST AFRICAN AMERICAN: 87 mL/min/{1.73_m2} (ref >=60)
GFR, Est Non African American: 75 mL/min/{1.73_m2} (ref >=60)
Glucose, Bld: 103 mg/dL — ABNORMAL HIGH (ref 65–99)
Potassium: 4.2 mmol/L (ref 3.5–5.3)
SODIUM: 143 mmol/L (ref 135–146)

## 2018-05-24 ENCOUNTER — Other Ambulatory Visit: Payer: Self-pay | Admitting: *Deleted

## 2018-05-24 DIAGNOSIS — R7981 Abnormal blood-gas level: Secondary | ICD-10-CM

## 2018-05-24 MED ORDER — AMBULATORY NON FORMULARY MEDICATION: 0 refills | Status: DC

## 2018-05-26 ENCOUNTER — Encounter: Payer: Self-pay | Admitting: Family Medicine

## 2018-06-08 ENCOUNTER — Telehealth: Payer: Self-pay | Admitting: Family Medicine

## 2018-06-08 NOTE — Telephone Encounter (Signed)
Please call Shelby Thomas and let her know that I did receive the results of her overnight pulse ox.  She did drop her oxygen level overnight.  She was below 89% for 58 minutes for almost an hour.  This does qualify for her overnight oxygen.  I would like to get her set up with 1 of the home health companies so that she can wear 2 L of oxygen through nasal cannula at night while she is sleeping.  I will complete the paperwork and get it sent in.

## 2018-06-09 NOTE — Telephone Encounter (Signed)
Spoke with Patient. She is OK to start night oxygen, but does not want to be on it during the day. She uses a CPAP currently, supplied by [facility]. Will route for order.

## 2018-06-10 NOTE — Telephone Encounter (Signed)
Why would she need a sleep study when we already know she has sleep apnea.  This is confusing to me what what a sleep study tell us that we do not already know that were not already treating.  I just want to verify whether or not there are to given her oxygen in addition to her CPAP.

## 2018-06-10 NOTE — Telephone Encounter (Signed)
Okay, I just realized that there was a previous order already sent in August for CPAP with oxygen set at 2 L.  Can we verify if that is what she is already getting.  She may not just be aware.  I do not send a new order if they are already doing this for her.

## 2018-06-10 NOTE — Telephone Encounter (Signed)
[address] [place], [phone], spoke with Larita Fife. Was advised the overnight oxygen test would not be enough for insurance coverage since Pt has COPD. She would have to go to a sleep lab for an overnight titration to qualify. Will route to PCP.

## 2018-06-11 DIAGNOSIS — G4734 Idiopathic sleep related nonobstructive alveolar hypoventilation: Secondary | ICD-10-CM | POA: Insufficient documentation

## 2018-06-11 NOTE — Assessment & Plan Note (Signed)
Based on recent test results her oxygen levels were low for almost an hour under 89% while sleeping.  This does explain why her CO2 levels have been high.  She does have sleep apnea and wears CPAP but clearly needs supplemental oxygen on her CPAP.  Will place order.

## 2018-06-11 NOTE — Telephone Encounter (Signed)
They are not giving her oxygen currently

## 2018-06-16 NOTE — Telephone Encounter (Signed)
We received form to cojmplete.

## 2018-06-18 ENCOUNTER — Encounter: Payer: Self-pay | Admitting: Sports Medicine

## 2018-06-18 ENCOUNTER — Ambulatory Visit (INDEPENDENT_AMBULATORY_CARE_PROVIDER_SITE_OTHER): Payer: Medicare Other | Admitting: Sports Medicine

## 2018-06-18 DIAGNOSIS — R1032 Left lower quadrant pain: Secondary | ICD-10-CM

## 2018-06-18 MED ORDER — CEPHALEXIN 500 MG PO CAPS: 500.0000 mg | ORAL_CAPSULE | Freq: Two times a day (BID) | ORAL | 0 refills | Status: DC

## 2018-06-18 NOTE — Assessment & Plan Note (Addendum)
Doing better today. Adding a urinalysis. I think she does not have anything wrong from an orthopedic perspective. She is post left hip arthroplasty without complication. Urinalysis does have some pyuria, adding Keflex. Return to see PCP as needed.

## 2018-06-18 NOTE — Progress Notes (Signed)
Subjective:    CC: Left groin pain  HPI: This is a pleasant 73 year old female post left hip arthroplasty sometime ago.  Uncomplicated doing well.  A week ago she started to have pain in her left groin, it is starting to improve.  No vaginal discharge, no obvious urgency, frequency, dysuria, blood.  I reviewed the past medical history, family history, social history, surgical history, and allergies today and no changes were needed.  Please see the problem list section below in epic for further details.  Past Medical History: Past Medical History:  Diagnosis Date  . CHF (congestive heart failure) (HCC)    mild  . Diabetes mellitus (HCC)   . Foraminal stenosis of lumbar region    L4--L5  . GERD (gastroesophageal reflux disease)   . Heart disease, congenital    large ASD, Sees Dr. Elmarie Shiley  . Hiatal hernia   . Hyperlipidemia    dyslipidemia  . Hypertension    pulmonary/on nighttime O2  . 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 OSA (obstructive sleep apnea)   . Pneumonia 2006  . Scoliosis    severe  . Spine misalignment    T spine 80% to right, L-Spine 55% to left   Past Surgical History: Past Surgical History:  Procedure Laterality Date  . CARDIAC SURGERY     repair of ASD  . ESOPHAGEAL DILATION  04-09   dilation/Dr. Inge Rise  . REPLACEMENT TOTAL KNEE Left 12/08/13   Dr. Dossie Arbour  . TOTAL HIP ARTHROPLASTY Left    Social History: Social History   Socioeconomic History  . Marital status: Married    Spouse name: Not on file  . Number of children: 2  . Years of education: Not on file  . Highest education level: Not on file  Occupational History  . Not on file  Social Needs  . Financial resource strain: Not on file  . Food insecurity:    Worry: Not on file    Inability: Not on file  . Transportation needs:    Medical: Not on file    Non-medical: Not on file  Tobacco Use  . Smoking status: Never Smoker  . Smokeless tobacco: Never Used    Substance and Sexual Activity  . Alcohol use: No  . Drug use: Not on file  . 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   Lifestyle  . Physical activity:    Days per week: Not on file    Minutes per session: Not on file  . Stress: Not on file  Relationships  . Social connections:    Talks on phone: Not on file    Gets together: Not on file    Attends religious service: Not on file    Active member of club or organization: Not on file    Attends meetings of clubs or organizations: Not on file    Relationship status: Not on file  Other Topics Concern  . Not on file  Social History Narrative  . Not on file   Family History: Family History  Problem Relation Age of Onset  . Diabetes Mother   . Stroke Mother   . CAD Mother        CABG in her 25s   Allergies: Allergies  Allergen Reactions  . Ramipril     REACTION: cough  . Tramadol Other (See Comments)    Nausea  Medications: See med rec.  Review of Systems: No fevers, chills, night sweats, weight loss, chest pain, or shortness of breath.   Objective:    General: Well Developed, well nourished, and in no acute distress.  Neuro: Alert and oriented x3, extra-ocular muscles intact, sensation grossly intact.  HEENT: Normocephalic, atraumatic, pupils equal round reactive to light, neck supple, no masses, no lymphadenopathy, thyroid nonpalpable.  Skin: Warm and dry, no rashes. Cardiac: Regular rate and rhythm, no murmurs rubs or gallops, no lower extremity edema.  Respiratory: Clear to auscultation bilaterally. Not using accessory muscles, speaking in full sentences. Abdomen: Soft, minimally tender in the left lower quadrant, nondistended, normal bowel sounds, palpable masses, no guarding, rigidity, rebound tenderness.  She does have a bit of mild intertrigo and tenderness in the skin folds.  Urinalysis with pyuria, adding a  culture.  Impression and Recommendations:    Left groin pain Doing better today. Adding a urinalysis. I think she does not have anything wrong from an orthopedic perspective. She is post left hip arthroplasty without complication. Urinalysis does have some pyuria, adding Keflex. Return to see PCP as needed. ___________________________________________ Ihor Austin. Benjamin Stain, M.D., ABFM., CAQSM. Primary Care and Sports Medicine [facility]  Adjunct Professor of Family Medicine  [employer]

## 2018-06-19 LAB — URINE CULTURE
MICRO NUMBER:: 197607
Result:: NO GROWTH
SPECIMEN QUALITY:: ADEQUATE

## 2018-08-12 ENCOUNTER — Telehealth: Payer: Self-pay

## 2018-08-12 NOTE — Telephone Encounter (Signed)
Patient received letter for Jury Duty on 10/18/18. Patient's juror number is [phone]. Requesting letter to be excused from jury duty due to medical conditions, not being mobile, and having to find transportation to go places/unable to drive.  Please write letter if ok

## 2018-08-12 NOTE — Telephone Encounter (Signed)
Ok for letter

## 2018-08-12 NOTE — Telephone Encounter (Signed)
Fleet Contras, I am pretty sure that if you are over 70 you can opt out just based on age.  You may want to just have her look at her paper and verify that in which case she does not need a letter she just needs to call them and say that she is over 70 and she does not want to participate from here on out.

## 2018-08-12 NOTE — Telephone Encounter (Signed)
Called patient, reports she looked into this already and the cut off is age 73.

## 2018-08-12 NOTE — Telephone Encounter (Signed)
Letter written for pt. She will come pick it up, she was advised to bring photo ID to pick up

## 2018-08-26 ENCOUNTER — Ambulatory Visit (INDEPENDENT_AMBULATORY_CARE_PROVIDER_SITE_OTHER): Payer: Medicare Other | Admitting: Family Medicine

## 2018-08-26 ENCOUNTER — Encounter: Payer: Self-pay | Admitting: Family Medicine

## 2018-08-26 VITALS — Ht 64.0 in

## 2018-08-26 DIAGNOSIS — E669 Obesity, unspecified: Secondary | ICD-10-CM

## 2018-08-26 DIAGNOSIS — Z9989 Dependence on other enabling machines and devices: Secondary | ICD-10-CM

## 2018-08-26 DIAGNOSIS — I272 Pulmonary hypertension, unspecified: Secondary | ICD-10-CM | POA: Diagnosis not present

## 2018-08-26 DIAGNOSIS — E1169 Type 2 diabetes mellitus with other specified complication: Secondary | ICD-10-CM | POA: Diagnosis not present

## 2018-08-26 DIAGNOSIS — I10 Essential (primary) hypertension: Secondary | ICD-10-CM | POA: Diagnosis not present

## 2018-08-26 DIAGNOSIS — G4733 Obstructive sleep apnea (adult) (pediatric): Secondary | ICD-10-CM | POA: Diagnosis not present

## 2018-08-26 DIAGNOSIS — G4734 Idiopathic sleep related nonobstructive alveolar hypoventilation: Secondary | ICD-10-CM

## 2018-08-26 MED ORDER — FUROSEMIDE 40 MG PO TABS: 40.0000 mg | ORAL_TABLET | ORAL | 1 refills | Status: DC | PRN

## 2018-08-26 NOTE — Progress Notes (Signed)
Virtual Visit via Telephone Note  I connected with Shelby Thomas on 08/26/18 at  9:10 AM EDT by telephone and verified that I am speaking with the correct person using two identifiers.   I discussed the limitations, risks, security and privacy concerns of performing an evaluation and management service by telephone and the availability of in person appointments. I also discussed with the patient that there may be a patient responsible charge related to this service. The patient expressed understanding and agreed to proceed.   Subjective:    CC: 3 mo f/u DM  HPI: Diabetes - no hypoglycemic events. No wounds or sores that are not healing well. No increased thirst or urination. Checking glucose at home. Taking medications as prescribed without any side effects.  Hypertension- Pt denies chest pain, SOB, dizziness, or heart palpitations.  Taking meds as directed w/o problems.  Denies medication side effects. No swelling.      F/u nocturnal hypoxemia - she has she hasn't noticed a difference in hwo she feels. She been wearing her CPA and oxygen.  Wes from 5-7  Hours.    Lower extremity edema - she only uses when swells.  Hasn't had to use for 1-2 week.  She has been doing really well with her swelling.      Past medical history, Surgical history, Family history not pertinant except as noted below, Social history, Allergies, and medications have been entered into the medical record, reviewed, and corrections made.   Review of Systems: No fevers, chills, night sweats, weight loss, chest pain, or shortness of breath.   Objective:    General: Speaking clearly in complete sentences without any shortness of breath.  Alert and oriented x3.  Normal judgment. No apparent acute distress.    Impression and Recommendations:    DM - usually well controlled. Home sugars have been normal. No mjaor changes in diet or activity.  Lab Results  Component Value Date   HGBA1C 7.1 (A) 05/21/2018      HTN - No home BP cuff. Will recheck at f/U in 4 months.    Nocturnal Hypoxemia/OSA - dong well with supplemental oxygen with hre CPAP.  She hasn't noticed a big difference in how she feels. Will check CO2 level this summer.  Labs ordered. Can go when stay at home order released.    Orders Placed This Encounter  Procedures  . Lipid panel  . COMPLETE METABOLIC PANEL WITH GFR  . Hemoglobin A1c       I discussed the assessment and treatment plan with the patient. The patient was provided an opportunity to ask questions and all were answered. The patient agreed with the plan and demonstrated an understanding of the instructions.   The patient was advised to call back or seek an in-person evaluation if the symptoms worsen or if the condition fails to improve as anticipated.  I provided 21 minutes of non-face-to-face time during this encounter.   Nani Gasser, MD

## 2018-09-15 ENCOUNTER — Ambulatory Visit: Payer: Medicare Other

## 2018-09-24 ENCOUNTER — Other Ambulatory Visit: Payer: Self-pay | Admitting: Family Medicine

## 2018-09-24 DIAGNOSIS — I272 Pulmonary hypertension, unspecified: Secondary | ICD-10-CM

## 2018-11-29 ENCOUNTER — Other Ambulatory Visit: Payer: Self-pay | Admitting: Family Medicine

## 2018-12-14 ENCOUNTER — Ambulatory Visit (INDEPENDENT_AMBULATORY_CARE_PROVIDER_SITE_OTHER): Payer: Medicare Other | Admitting: Osteopathic Medicine

## 2018-12-14 ENCOUNTER — Other Ambulatory Visit: Payer: Self-pay

## 2018-12-14 ENCOUNTER — Encounter: Payer: Self-pay | Admitting: Osteopathic Medicine

## 2018-12-14 VITALS — BP 144/71 | HR 96 | Temp 98.5°F

## 2018-12-14 DIAGNOSIS — M545 Low back pain, unspecified: Secondary | ICD-10-CM

## 2018-12-14 DIAGNOSIS — R350 Frequency of micturition: Secondary | ICD-10-CM | POA: Diagnosis not present

## 2018-12-14 LAB — POCT URINALYSIS DIPSTICK
Bilirubin, UA: NEGATIVE
Blood, UA: NEGATIVE
Glucose, UA: NEGATIVE
Ketones, UA: NEGATIVE
Nitrite, UA: NEGATIVE
Protein, UA: NEGATIVE
Spec Grav, UA: 1.02 (ref 1.010–1.025)
Urobilinogen, UA: 4 E.U./dL — AB
pH, UA: 7 (ref 5.0–8.0)

## 2018-12-14 MED ORDER — NITROFURANTOIN MONOHYD MACRO 100 MG PO CAPS: 100.0000 mg | ORAL_CAPSULE | Freq: Two times a day (BID) | ORAL | 0 refills | Status: DC

## 2018-12-14 NOTE — Patient Instructions (Signed)
Plan:  Antibiotics have been sent to pharmacy   If worse or change, let us know!  Will call you once we have culture results back, probably Thursday or Friday.  If no evidence of UTI on culture, may consider stopping antibiotics and following up with Dr. Madilyn Fireman - back spasm may be more to blame for your symptoms

## 2018-12-14 NOTE — Progress Notes (Signed)
HPI: Shelby Thomas is a 73 y.o. female who  has a past medical history of CHF (congestive heart failure) ([place]), Diabetes mellitus ([place]), Foraminal stenosis of lumbar region, GERD (gastroesophageal reflux disease), Heart disease, congenital, Hiatal hernia, Hyperlipidemia, Hypertension, MVRCS association, OSA (obstructive sleep apnea), Pneumonia (2006), Scoliosis, and Spine misalignment.  she presents to [facility] today, 12/14/18,  for chief complaint of:  Urinary concern & back pain  . Location: lower back, suprapubic area  . Quality: back spasm, urinary frequency and bladder spasm  . Duration: 3 days  . Assoc signs/symptoms: back pain      At today's visit 12/14/18 ... PMH, PSH, FH reviewed and updated as needed.  Current medication list and allergy/intolerance hx reviewed and updated as needed. (See remainder of HPI, ROS, Phys Exam below)    Neg UCx 06/2018  No results found.  Results for orders placed or performed in visit on 12/14/18 (from the past 72 hour(s))  POCT Urinalysis Dipstick     Status: Abnormal   Collection Time: 12/14/18 10:46 AM  Result Value Ref Range   Color, UA AMBER    Clarity, UA CLEAR    Glucose, UA Negative Negative   Bilirubin, UA NEGATIVE    Ketones, UA NEGATIVE    Spec Grav, UA 1.020 1.010 - 1.025   Blood, UA NEGATIVE    pH, UA 7.0 5.0 - 8.0   Protein, UA Negative Negative   Urobilinogen, UA 4.0 (A) 0.2 or 1.0 E.U./dL   Nitrite, UA NEGATIVE    Leukocytes, UA Trace (A) Negative   Appearance     Odor            ASSESSMENT/PLAN: The primary encounter diagnosis was Frequent urination. A diagnosis of Acute bilateral low back pain without sciatica was also pertinent to this visit.   Orders Placed This Encounter  Procedures  . Urine Culture  . Urinalysis, microscopic only  . POCT Urinalysis Dipstick     Meds ordered this encounter  Medications  . nitrofurantoin, macrocrystal-monohydrate,  (MACROBID) 100 MG capsule    Sig: Take 1 capsule (100 mg total) by mouth 2 (two) times daily.    Dispense:  10 capsule    Refill:  0    Patient Instructions  Plan:  Antibiotics have been sent to pharmacy   If worse or change, let us know!  Will call you once we have culture results back, probably Thursday or Friday.  If no evidence of UTI on culture, may consider stopping antibiotics and following up with Dr. Madilyn Fireman - back spasm may be more to blame for your symptoms       Follow-up plan: Return if symptoms worsen or fail to improve, and as directed by PCP.                                                 ################################################# ################################################# ################################################# #################################################    Current Meds  Medication Sig  . AMBULATORY NON FORMULARY MEDICATION Medication Name: Free Style InsuLinx test strips.  Use daily as directed  . AMBULATORY NON FORMULARY MEDICATION Medication Name: Oxygen with CPAP qhs at 2L/min. Diagnosis: Pulmonary Hypertension, Dyspnea on Exertion  . aspirin 81 MG tablet Take 81 mg by mouth daily.    . furosemide (LASIX) 40 MG tablet TAKE ONE TABLET (40 MG TOTAL) BY MOUTH AS  NEEDED  . metFORMIN (GLUCOPHAGE) 500 MG tablet TAKE ONE TABLET BY MOUTH TWICE DAILY WITH A MEAL  . Multiple Vitamin (MULTIVITAMIN) capsule Take 1 capsule by mouth daily.    Marland Kitchen. omeprazole (PRILOSEC) 40 MG capsule TAKE ONE CAPSULE BY MOUTH DAILY  . potassium chloride (MICRO-K) 10 MEQ CR capsule TAKE ONE CAPSULE BY MOUTH ONCE DAILY AS NEEDED WHEN TAKING FUROSEMIDE  . simvastatin (ZOCOR) 40 MG tablet Take 1 tablet (40 mg total) by mouth daily.    Allergies  Allergen Reactions  . Ramipril     REACTION: cough  . Tramadol Other (See Comments)    Nausea       Review of Systems:  Constitutional: No recent  illness  HEENT: No  headache, no vision change  Cardiac: No  chest pain, No  pressure, No palpitations  Respiratory:  No  shortness of breath. No  Cough  Gastrointestinal: No  abdominal pain, no change on bowel habits  Musculoskeletal: +new myalgia/arthralgia, chronic back issues   Skin: No  Rash  Neurologic: No  weakness, No  Dizziness   Exam:  BP (!) 144/71 (BP Location: Left Arm, Patient Position: Sitting, Cuff Size: Normal)   Pulse 96   Temp 98.5 F (36.9 C) (Oral)   Constitutional: VS see above. General Appearance: alert, well-developed, well-nourished, NAD  Eyes: Normal lids and conjunctive, non-icteric sclera  Neck: No masses, trachea midline.   Respiratory: Normal respiratory effort. no wheeze, no rhonchi, no rales  Cardiovascular: S1/S2 normal, no murmur, no rub/gallop auscultated. RRR.   Musculoskeletal: Gait normal. Symmetric and independent movement of all extremities, neg Lloyd's sign, back scoliotic   Neurological: Normal balance/coordination. No tremor.  Skin: warm, dry, intact.   Psychiatric: Normal judgment/insight. Normal mood and affect. Oriented x3.       Visit summary with medication list and pertinent instructions was printed for patient to review, patient was advised to alert us if any updates are needed. All questions at time of visit were answered - patient instructed to contact office with any additional concerns. ER/RTC precautions were reviewed with the patient and understanding verbalized.    Please note: voice recognition software was used to produce this document, and typos may escape review. Please contact Dr. Lyn HollingsheadAlexander for any needed clarifications.    Follow up plan: Return if symptoms worsen or fail to improve, and as directed by PCP.

## 2018-12-16 LAB — URINE CULTURE
MICRO NUMBER:: 760198
SPECIMEN QUALITY:: ADEQUATE

## 2018-12-16 LAB — URINALYSIS, MICROSCOPIC ONLY
Bacteria, UA: NONE SEEN /HPF
Hyaline Cast: NONE SEEN /LPF
Squamous Epithelial / HPF: NONE SEEN /HPF (ref <=5)

## 2018-12-27 ENCOUNTER — Ambulatory Visit (INDEPENDENT_AMBULATORY_CARE_PROVIDER_SITE_OTHER): Payer: Medicare Other | Admitting: Family Medicine

## 2018-12-27 ENCOUNTER — Other Ambulatory Visit: Payer: Self-pay

## 2018-12-27 ENCOUNTER — Encounter: Payer: Self-pay | Admitting: Family Medicine

## 2018-12-27 VITALS — BP 132/50 | HR 79 | Temp 98.4°F | Ht 64.0 in | Wt 217.0 lb

## 2018-12-27 DIAGNOSIS — M545 Low back pain, unspecified: Secondary | ICD-10-CM

## 2018-12-27 DIAGNOSIS — I10 Essential (primary) hypertension: Secondary | ICD-10-CM

## 2018-12-27 DIAGNOSIS — E1169 Type 2 diabetes mellitus with other specified complication: Secondary | ICD-10-CM | POA: Diagnosis not present

## 2018-12-27 DIAGNOSIS — E669 Obesity, unspecified: Secondary | ICD-10-CM

## 2018-12-27 DIAGNOSIS — I700 Atherosclerosis of aorta: Secondary | ICD-10-CM

## 2018-12-27 DIAGNOSIS — Z23 Encounter for immunization: Secondary | ICD-10-CM | POA: Diagnosis not present

## 2018-12-27 LAB — POCT GLYCOSYLATED HEMOGLOBIN (HGB A1C): Hemoglobin A1C: 7 % — AB (ref 4.0–5.6)

## 2018-12-27 NOTE — Assessment & Plan Note (Signed)
Asymptomatic.  Continue with statin.

## 2018-12-27 NOTE — Progress Notes (Signed)
Established Patient Office Visit  Subjective:  Patient ID: Shelby Thomas, female    DOB: January 13, 1946  Age: 73 y.o. MRN: [id number]  CC:  Chief Complaint  Patient presents with  . Diabetes    HPI Shelby Thomas presents for  Diabetes - no hypoglycemic events. No wounds or sores that are not healing well. No increased thirst or urination. Checking glucose at home. Taking medications as prescribed without any side effects.  She also wanted let me know about her visit to the office about 2 weeks ago.  She was having some acute right low back pain.  She says the first time it happened it lasted on and off for about 2 weeks it was more of a sharp shooting pain.  And then resolved on its own.  And she came in again about 2 weeks ago at that point she has had pain just for the weekend.  She denies any dysuria, or hematuria.  But had had some urinary frequency.  Analysis just showed some trace leukocytes no blood or protein levels and urine culture ended up being negative.  Aortic atherosclerosis-no recent chest pain or shortness of breath.  She reports that she does take her simvastatin regularly.  Past Medical History:  Diagnosis Date  . CHF (congestive heart failure) ([facility])    mild  . Diabetes mellitus ([place])   . Foraminal stenosis of lumbar region    L4--L5  . GERD (gastroesophageal reflux disease)   . Heart disease, congenital    large ASD, Sees Dr. Zollie Beckers  . Hiatal hernia   . Hyperlipidemia    dyslipidemia  . Hypertension    pulmonary/on nighttime O2  . 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 OSA (obstructive sleep apnea)   . Pneumonia 2006  . Scoliosis    severe  . Spine misalignment    T spine 80% to right, L-Spine 55% to left    Past Surgical History:  Procedure Laterality Date  . CARDIAC SURGERY     repair of ASD  . ESOPHAGEAL DILATION  04-09   dilation/Dr. Mathews Robinsons  . REPLACEMENT TOTAL KNEE Left 12/08/13   Dr. Williams Che  . TOTAL HIP  ARTHROPLASTY Left     Family History  Problem Relation Age of Onset  . Diabetes Mother   . Stroke Mother   . CAD Mother        CABG in her 56s    Social History   Socioeconomic History  . Marital status: Married    Spouse name: Not on file  . Number of children: 2  . Years of education: Not on file  . Highest education level: Not on file  Occupational History  . Not on file  Social Needs  . Financial resource strain: Not on file  . Food insecurity    Worry: Not on file    Inability: Not on file  . Transportation needs    Medical: Not on file    Non-medical: Not on file  Tobacco Use  . Smoking status: Never Smoker  . Smokeless tobacco: Never Used  Substance and Sexual Activity  . Alcohol use: No  . Drug use: Not on file  . 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   Lifestyle  . Physical activity    Days per week: Not on file    Minutes per session: Not on file  .  Stress: Not on file  Relationships  . Social Musicianconnections    Talks on phone: Not on file    Gets together: Not on file    Attends religious service: Not on file    Active member of club or organization: Not on file    Attends meetings of clubs or organizations: Not on file    Relationship status: Not on file  . Intimate partner violence    Fear of current or ex partner: Not on file    Emotionally abused: Not on file    Physically abused: Not on file    Forced sexual activity: Not on file  Other Topics Concern  . Not on file  Social History Narrative  . Not on file    Outpatient Medications Prior to Visit  Medication Sig Dispense Refill  . AMBULATORY NON FORMULARY MEDICATION Medication Name: Free Style InsuLinx test strips.  Use daily as directed 50 strip 12  . AMBULATORY NON FORMULARY MEDICATION Medication Name: Oxygen with CPAP qhs at 2L/min. Diagnosis: Pulmonary Hypertension, Dyspnea on Exertion 1  Units prn  . aspirin 81 MG tablet Take 81 mg by mouth daily.      . furosemide (LASIX) 40 MG tablet TAKE ONE TABLET (40 MG TOTAL) BY MOUTH AS NEEDED 30 tablet 1  . metFORMIN (GLUCOPHAGE) 500 MG tablet TAKE ONE TABLET BY MOUTH TWICE DAILY WITH A MEAL 180 tablet 1  . Multiple Vitamin (MULTIVITAMIN) capsule Take 1 capsule by mouth daily.      Marland Kitchen. omeprazole (PRILOSEC) 40 MG capsule TAKE ONE CAPSULE BY MOUTH DAILY 30 capsule 11  . potassium chloride (MICRO-K) 10 MEQ CR capsule TAKE ONE CAPSULE BY MOUTH ONCE DAILY AS NEEDED WHEN TAKING FUROSEMIDE 30 capsule 11  . simvastatin (ZOCOR) 40 MG tablet Take 1 tablet (40 mg total) by mouth daily. 90 tablet 3  . nitrofurantoin, macrocrystal-monohydrate, (MACROBID) 100 MG capsule Take 1 capsule (100 mg total) by mouth 2 (two) times daily. 10 capsule 0   No facility-administered medications prior to visit.     Allergies  Allergen Reactions  . Ramipril     REACTION: cough  . Tramadol Other (See Comments)    Nausea    ROS Review of Systems    Objective:    Physical Exam  Constitutional: She is oriented to person, place, and time. She appears well-developed and well-nourished.  HENT:  Head: Normocephalic and atraumatic.  Cardiovascular: Normal rate, regular rhythm and normal heart sounds.  Pulmonary/Chest: Effort normal and breath sounds normal.  Neurological: She is alert and oriented to person, place, and time.  Skin: Skin is warm and dry.  Psychiatric: She has a normal mood and affect. Her behavior is normal.    BP (!) 132/50   Pulse 79   Temp 98.4 F (36.9 C)   Ht 5\' 4"  (1.626 m)   Wt 217 lb (98.4 kg)   SpO2 96%   BMI 37.25 kg/m  Wt Readings from Last 3 Encounters:  12/27/18 217 lb (98.4 kg)  05/21/18 215 lb (97.5 kg)  02/18/18 221 lb (100.2 kg)     Health Maintenance Due  Topic Date Due  . MAMMOGRAM  10/23/2017  . OPHTHALMOLOGY EXAM  10/15/2018  . FOOT EXAM  11/26/2018  . URINE MICROALBUMIN  11/26/2018    There are no  preventive care reminders to display for this patient.  Lab Results  Component Value Date   TSH 0.78 11/25/2017   Lab Results  Component Value Date   WBC  7.4 03/03/2017   HGB 11.2 (L) 04/03/2017   HCT 31.7 (L) 03/03/2017   MCV 86.4 03/03/2017   PLT 397 03/03/2017   Lab Results  Component Value Date   NA 143 05/21/2018   K 4.2 05/21/2018   CO2 33 (H) 05/21/2018   GLUCOSE 103 (H) 05/21/2018   BUN 11 05/21/2018   CREATININE 0.79 05/21/2018   BILITOT 0.4 11/25/2017   ALKPHOS 90 10/14/2016   AST 15 11/25/2017   ALT 12 11/25/2017   PROT 7.0 11/25/2017   ALBUMIN 3.7 10/14/2016   CALCIUM 9.5 05/21/2018   Lab Results  Component Value Date   CHOL 192 11/25/2017   Lab Results  Component Value Date   HDL 68 11/25/2017   Lab Results  Component Value Date   LDLCALC 104 (H) 11/25/2017   Lab Results  Component Value Date   TRIG 100 11/25/2017   Lab Results  Component Value Date   CHOLHDL 2.8 11/25/2017   Lab Results  Component Value Date   HGBA1C 7.0 (A) 12/27/2018      Assessment & Plan:   Problem List Items Addressed This Visit      Cardiovascular and Mediastinum   HYPERTENSION, BENIGN ESSENTIAL    Well controlled. Continue current regimen. Follow up in  3-4 mo      Aortic atherosclerosis (H[facility])    Asymptomatic.  Continue with statin.        Endocrine   Diabetes mellitus type 2 in obese ([facility]) - Primary    A1c looks fantastic today at 7.0, improved.  Continue current regimen she is really doing great.  She said she had cut down her carbs and cut back on sweet tea and now is just trying to work on maybe trying to lose a few pounds. Great work in changes she has made.       Relevant Orders   POCT glycosylated hemoglobin (Hb A1C) (Completed)    Other Visit Diagnoses    Need for immunization against influenza       Relevant Orders   Flu Vaccine QUAD High Dose(Fluad) (Completed)   Acute right-sided low back pain without sciatica          Right low back  pain-could be coming from her actual spine.  Suspect that is probably the cause.  But we also discussed possibly getting an ultrasound of her kidneys if the pain recurs.  No orders of the defined types were placed in this encounter.   Follow-up: Return in about 4 months (around 04/28/2019) for Diabetes follow-up.   Reminded her to schedule an eye exam.   Nani Gasseratherine Metheney, MD

## 2018-12-27 NOTE — Assessment & Plan Note (Signed)
Well controlled. Continue current regimen. Follow up in  3-4 mo  

## 2018-12-27 NOTE — Assessment & Plan Note (Addendum)
A1c looks fantastic today at 7.0, improved.  Continue current regimen she is really doing great.  She said she had cut down her carbs and cut back on sweet tea and now is just trying to work on maybe trying to lose a few pounds. Great work in changes she has made.

## 2018-12-28 LAB — COMPLETE METABOLIC PANEL WITH GFR
AG Ratio: 1.3 (calc) (ref 1.0–2.5)
ALT: 9 U/L (ref 6–29)
AST: 14 U/L (ref 10–35)
Albumin: 3.9 g/dL (ref 3.6–5.1)
Alkaline phosphatase (APISO): 59 U/L (ref 37–153)
BUN: 10 mg/dL (ref 7–25)
CO2: 31 mmol/L (ref 20–32)
Calcium: 9.7 mg/dL (ref 8.6–10.4)
Chloride: 103 mmol/L (ref 98–110)
Creat: 0.66 mg/dL (ref 0.60–0.93)
GFR, Est African American: 103 mL/min/{1.73_m2} (ref >=60)
GFR, Est Non African American: 89 mL/min/{1.73_m2} (ref >=60)
Globulin: 3 g/dL (calc) (ref 1.9–3.7)
Glucose, Bld: 110 mg/dL — ABNORMAL HIGH (ref 65–99)
Potassium: 4.3 mmol/L (ref 3.5–5.3)
Sodium: 143 mmol/L (ref 135–146)
Total Bilirubin: 0.5 mg/dL (ref 0.2–1.2)
Total Protein: 6.9 g/dL (ref 6.1–8.1)

## 2018-12-28 LAB — LIPID PANEL
Cholesterol: 177 mg/dL (ref <200)
HDL: 56 mg/dL (ref >=50)
LDL Cholesterol (Calc): 102 mg/dL (calc) — ABNORMAL HIGH
Non-HDL Cholesterol (Calc): 121 mg/dL (calc) (ref <130)
Total CHOL/HDL Ratio: 3.2 (calc) (ref <5.0)
Triglycerides: 92 mg/dL (ref <150)

## 2019-05-05 ENCOUNTER — Encounter: Payer: Self-pay | Admitting: Medical-Surgical

## 2019-05-05 ENCOUNTER — Ambulatory Visit (INDEPENDENT_AMBULATORY_CARE_PROVIDER_SITE_OTHER): Payer: Medicare Other | Admitting: Medical-Surgical

## 2019-05-05 ENCOUNTER — Other Ambulatory Visit: Payer: Self-pay

## 2019-05-05 VITALS — BP 101/63 | HR 94 | Temp 98.0°F | Ht 64.0 in | Wt 220.0 lb

## 2019-05-05 DIAGNOSIS — K625 Hemorrhage of anus and rectum: Secondary | ICD-10-CM

## 2019-05-05 DIAGNOSIS — R197 Diarrhea, unspecified: Secondary | ICD-10-CM

## 2019-05-05 DIAGNOSIS — R110 Nausea: Secondary | ICD-10-CM

## 2019-05-05 HISTORY — DX: Hemorrhage of anus and rectum: K62.5

## 2019-05-05 MED ORDER — ONDANSETRON 8 MG PO TBDP: 8.0000 mg | ORAL_TABLET | Freq: Three times a day (TID) | ORAL | 0 refills | Status: DC | PRN

## 2019-05-05 NOTE — Patient Instructions (Signed)
Food Choices to Help Relieve Diarrhea, Adult When you have diarrhea, the foods you eat and your eating habits are very important. Choosing the right foods and drinks can help:  Relieve diarrhea.  Replace lost fluids and nutrients.  Prevent dehydration. What general guidelines should I follow?  Relieving diarrhea  Choose foods with less than 2 g or .07 oz. of fiber per serving.  Limit fats to less than 8 tsp (38 g or 1.34 oz.) a day.  Avoid the following: ? Foods and beverages sweetened with high-fructose corn syrup, honey, or sugar alcohols such as xylitol, sorbitol, and mannitol. ? Foods that contain a lot of fat or sugar. ? Fried, greasy, or spicy foods. ? High-fiber grains, breads, and cereals. ? Raw fruits and vegetables.  Eat foods that are rich in probiotics. These foods include dairy products such as yogurt and fermented milk products. They help increase healthy bacteria in the stomach and intestines (gastrointestinal tract, or GI tract).  If you have lactose intolerance, avoid dairy products. These may make your diarrhea worse.  Take medicine to help stop diarrhea (antidiarrheal medicine) only as told by your health care provider. Replacing nutrients  Eat small meals or snacks every 3-4 hours.  Eat bland foods, such as white rice, toast, or baked potato, until your diarrhea starts to get better. Gradually reintroduce nutrient-rich foods as tolerated or as told by your health care provider. This includes: ? Well-cooked protein foods. ? Peeled, seeded, and soft-cooked fruits and vegetables. ? Low-fat dairy products.  Take vitamin and mineral supplements as told by your health care provider. Preventing dehydration  Start by sipping water or a special solution to prevent dehydration (oral rehydration solution, ORS). Urine that is clear or pale yellow means that you are getting enough fluid.  Try to drink at least 8-10 cups of fluid each day to help replace lost  fluids.  You may add other liquids in addition to water, such as clear juice or decaffeinated sports drinks, as tolerated or as told by your health care provider.  Avoid drinks with caffeine, such as coffee, tea, or soft drinks.  Avoid alcohol. What foods are recommended?     The items listed may not be a complete list. Talk with your health care provider about what dietary choices are best for you. Grains White rice. White, French, or pita breads (fresh or toasted), including plain rolls, buns, or bagels. White pasta. Saltine, soda, or graham crackers. Pretzels. Low-fiber cereal. Cooked cereals made with water (such as cornmeal, farina, or cream cereals). Plain muffins. Matzo. Melba toast. Zwieback. Vegetables Potatoes (without the skin). Most well-cooked and canned vegetables without skins or seeds. Tender lettuce. Fruits Apple sauce. Fruits canned in juice. Cooked apricots, cherries, grapefruit, peaches, pears, or plums. Fresh bananas and cantaloupe. Meats and other protein foods Baked or boiled chicken. Eggs. Tofu. Fish. Seafood. Smooth nut butters. Ground or well-cooked tender beef, ham, veal, lamb, pork, or poultry. Dairy Plain yogurt, kefir, and unsweetened liquid yogurt. Lactose-free milk, buttermilk, skim milk, or soy milk. Low-fat or nonfat hard cheese. Beverages Water. Low-calorie sports drinks. Fruit juices without pulp. Strained tomato and vegetable juices. Decaffeinated teas. Sugar-free beverages not sweetened with sugar alcohols. Oral rehydration solutions, if approved by your health care provider. Seasoning and other foods Bouillon, broth, or soups made from recommended foods. What foods are not recommended? The items listed may not be a complete list. Talk with your health care provider about what dietary choices are best for you. Grains Whole   grain, whole wheat, bran, or rye breads, rolls, pastas, and crackers. Wild or brown rice. Whole grain or bran cereals. Barley.  Oats and oatmeal. Corn tortillas or taco shells. Granola. Popcorn. Vegetables Raw vegetables. Fried vegetables. Cabbage, broccoli, Brussels sprouts, artichokes, baked beans, beet greens, corn, kale, legumes, peas, sweet potatoes, and yams. Potato skins. Cooked spinach and cabbage. Fruits Dried fruit, including raisins and dates. Raw fruits. Stewed or dried prunes. Canned fruits with syrup. Meat and other protein foods Fried or fatty meats. Deli meats. Chunky nut butters. Nuts and seeds. Beans and lentils. Bacon. Hot dogs. Sausage. Dairy High-fat cheeses. Whole milk, chocolate milk, and beverages made with milk, such as milk shakes. Half-and-half. Cream. sour cream. Ice cream. Beverages Caffeinated beverages (such as coffee, tea, soda, or energy drinks). Alcoholic beverages. Fruit juices with pulp. Prune juice. Soft drinks sweetened with high-fructose corn syrup or sugar alcohols. High-calorie sports drinks. Fats and oils Butter. Cream sauces. Margarine. Salad oils. Plain salad dressings. Olives. Avocados. Mayonnaise. Sweets and desserts Sweet rolls, doughnuts, and sweet breads. Sugar-free desserts sweetened with sugar alcohols such as xylitol and sorbitol. Seasoning and other foods Honey. Hot sauce. Chili powder. Gravy. Cream-based or milk-based soups. Pancakes and waffles. Summary  When you have diarrhea, the foods you eat and your eating habits are very important.  Make sure you get at least 8-10 cups of fluid each day, or enough to keep your urine clear or pale yellow.  Eat bland foods and gradually reintroduce healthy, nutrient-rich foods as tolerated, or as told by your health care provider.  Avoid high-fiber, fried, greasy, or spicy foods. This information is not intended to replace advice given to you by your health care provider. Make sure you discuss any questions you have with your health care provider. Document Revised: 08/12/2018 Document Reviewed: 04/18/2016 [employer]vier Patient  Education  2020 [employer]  

## 2019-05-05 NOTE — Progress Notes (Signed)
Subjective:    CC: blood in stool  HPI:  Pleasant 73 year old female presenting with complaints of blood in stool starting 4 days prior to arrival.  Bowel movement was formed but looked bloody on Monday.  On Tuesday her bowel movement was loose and there was more blood present.  She reports the stool was very dark brown.  No further bowel movements until yesterday where she developed diarrhea which was watery with dark brown/black particles.  After stooling, the water in the toilet turned bright red.  Today reports 3-4 small, watery, bloody bowel movements.  She reports she has had "bubbly stomach" feeling since yesterday.  Some transient nausea noted on Monday and then again last night with no vomiting.  No history of hemorrhoids.  No abdominal pain.  No significant dietary changes, but does note an increase in fruit consumption.  Has not eaten today and has only had a small sip of water because she is afraid that this will cause her to stool more.  Was seen by GI in 2019 for esophageal dysphagia.  Last colonoscopy in 2015, patient reports was normal.     I reviewed the past medical history, family history, social history, surgical history, and allergies today and no changes were needed.  Please see the problem list section below in epic for further details.  Past Medical History: Past Medical History:  Diagnosis Date  . CHF (congestive heart failure) (HCC)    mild  . Diabetes mellitus (HCC)   . Foraminal stenosis of lumbar region    L4--L5  . GERD (gastroesophageal reflux disease)   . Heart disease, congenital    large ASD, Sees Dr. Elmarie Shiley  . Hiatal hernia   . Hyperlipidemia    dyslipidemia  . Hypertension    pulmonary/on nighttime O2  . 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 OSA (obstructive sleep apnea)   . Pneumonia 2006  . Scoliosis    severe  . Spine misalignment    T spine 80% to right, L-Spine 55% to left   Past Surgical History: Past Surgical History:   Procedure Laterality Date  . CARDIAC SURGERY     repair of ASD  . ESOPHAGEAL DILATION  04-09   dilation/Dr. Inge Rise  . REPLACEMENT TOTAL KNEE Left 12/08/13   Dr. Dossie Arbour  . TOTAL HIP ARTHROPLASTY Left    Social History: Social History   Socioeconomic History  . Marital status: Married    Spouse name: Not on file  . Number of children: 2  . Years of education: Not on file  . Highest education level: Not on file  Occupational History  . Not on file  Tobacco Use  . Smoking status: Never Smoker  . Smokeless tobacco: Never Used  Substance and Sexual Activity  . Alcohol use: No  . Drug use: Not on file  . 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   Other Topics Concern  . Not on file  Social History Narrative  . Not on file   Social Determinants of Health   Financial Resource Strain:   . Difficulty of Paying Living Expenses: Not on file  Food Insecurity:   . Worried About Programme researcher, broadcasting/film/video in the Last Year: Not on file  . Ran Out of Food in the Last Year: Not on file  Transportation Needs:   . Lack of Transportation (Medical): Not on  file  . Lack of Transportation (Non-Medical): Not on file  Physical Activity:   . Days of Exercise per Week: Not on file  . Minutes of Exercise per Session: Not on file  Stress:   . Feeling of Stress : Not on file  Social Connections:   . Frequency of Communication with Friends and Family: Not on file  . Frequency of Social Gatherings with Friends and Family: Not on file  . Attends Religious Services: Not on file  . Active Member of Clubs or Organizations: Not on file  . Attends Archivist Meetings: Not on file  . Marital Status: Not on file   Family History: Family History  Problem Relation Age of Onset  . Diabetes Mother   . Stroke Mother   . CAD Mother        CABG in her 9s   Allergies: Allergies  Allergen  Reactions  . Ramipril     REACTION: cough  . Tramadol Other (See Comments)    Nausea   Medications: See med rec.  Review of Systems: No fevers, chills, night sweats, weight loss, chest pain, or shortness of breath.   Objective:    General: Well Developed, well nourished, and in no acute distress.  Neuro: Alert and oriented x3.  HEENT: Normocephalic, atraumatic.  Skin: Warm and dry. Cardiac: Regular rate and rhythm, no murmurs rubs or gallops, no lower extremity edema.  Respiratory: Clear to auscultation bilaterally. Not using accessory muscles, speaking in full sentences. Abdomen: Wheelchair-bound patient, exam limited by mobility.  Abdomen soft, nontender, nondistended.  Bowel sounds positive x4 quadrants.     Impression and Recommendations:    BRBPR (bright red blood per rectum) Check stat CBC.  Continue to monitor bowel movements for changes in the amount of blood.  If this should increase or if symptoms develop such as lightheadedness, hypotension, weakness, and fatigue please seek immediate care at the nearest emergency room.  Diarrhea Labs closed for new year holiday.  Unable to obtain stool specimen cup at this time to run stool culture and C. difficile PCR.  Continue to monitor symptoms over the weekend.  Push p.o. fluids and eat solids as tolerated.  Information regarding BRAT diet included in patient instructions.  Nausea Zofran 8 mg ODT tablets as needed every 8 hours.  Return Call the office first thing on Monday if still having blood in stool and diarrhea.  Will enter urgent referral for GI evaluation at that time.  ___________________________________________ Clearnce Sorrel, DNP, APRN, FNP-BC [facility] [place]

## 2019-05-05 NOTE — Assessment & Plan Note (Signed)
Zofran 8 mg ODT tablets as needed every 8 hours.

## 2019-05-05 NOTE — Assessment & Plan Note (Addendum)
Check stat CBC.  Continue to monitor bowel movements for changes in the amount of blood.  If this should increase or if symptoms develop such as lightheadedness, hypotension, weakness, and fatigue please seek immediate care at the nearest emergency room.

## 2019-05-05 NOTE — Assessment & Plan Note (Signed)
Labs closed for new year holiday.  Unable to obtain stool specimen cup at this time to run stool culture and C. difficile PCR.  Continue to monitor symptoms over the weekend.  Push p.o. fluids and eat solids as tolerated.  Information regarding BRAT diet included in patient instructions.

## 2019-05-06 ENCOUNTER — Telehealth (INDEPENDENT_AMBULATORY_CARE_PROVIDER_SITE_OTHER): Payer: Medicare PPO | Admitting: Medical-Surgical

## 2019-05-06 DIAGNOSIS — D649 Anemia, unspecified: Secondary | ICD-10-CM

## 2019-05-06 LAB — CBC
HCT: 23.2 % — ABNORMAL LOW (ref 35.0–45.0)
Hemoglobin: 7.4 g/dL — ABNORMAL LOW (ref 11.7–15.5)
MCH: 31.5 pg (ref 27.0–33.0)
MCHC: 31.9 g/dL — ABNORMAL LOW (ref 32.0–36.0)
MCV: 98.7 fL (ref 80.0–100.0)
MPV: 9.8 fL (ref 7.5–12.5)
Platelets: 259 10*3/uL (ref 140–400)
RBC: 2.35 10*6/uL — ABNORMAL LOW (ref 3.80–5.10)
RDW: 12.9 % (ref 11.0–15.0)
WBC: 11 10*3/uL — ABNORMAL HIGH (ref 3.8–10.8)

## 2019-05-06 NOTE — Telephone Encounter (Signed)
Virtual Visit via Telephone   I connected with  Shelby Thomas  on 05/06/19  by telephone/telehealth and verified that I am speaking with the correct person using two identifiers.   I discussed the limitations, risks, security and privacy concerns of performing an evaluation and management service by telephone, including the higher likelihood of inaccurate diagnosis and treatment, and the availability of in person appointments.  We also discussed the likely need of an additional face to face encounter for complete and high quality delivery of care.  I also discussed with the patient that there may be a patient responsible charge related to this service. The patient expressed understanding and wishes to proceed.  Provider location is at home. Patient location is at their home, different from provider location.  Stat CBC results with hemoglobin of 7.4.  Contacted patient via telephone.  Reports she is feeling much better today.  No lightheadedness, fatigue, dizziness.  Blood pressure stable in the 130s over 70s.  Reports having 1 small bloody bowel movement last night in which she passed a very small clot.  No further bloody stools today.    Review of Systems: No fevers, chills, night sweats, weight loss, chest pain, or shortness of breath.   Objective Findings:    General: Speaking full sentences, no audible heavy breathing.  Sounds alert and appropriately interactive.    CBC    Component Value Date/Time   WBC 11.0 (H) 05/05/2019 1639   RBC 2.35 (L) 05/05/2019 1639   HGB 7.4 (L) 05/05/2019 1639   HCT 23.2 (L) 05/05/2019 1639   PLT 259 05/05/2019 1639   MCV 98.7 05/05/2019 1639   MCH 31.5 05/05/2019 1639   MCHC 31.9 (L) 05/05/2019 1639   RDW 12.9 05/05/2019 1639   LYMPHSABS 1,680 10/14/2016 1022   MONOABS 420 10/14/2016 1022   EOSABS 120 10/14/2016 1022   BASOSABS 0 10/14/2016 1022     Impression and Recommendations:     BRBPR/anemia  Patient baseline Hgb low (10-11). Given  current vital signs, no further stools, and asymptomatic status, continue to monitor for bloody stools.  Advised to go to the emergency room if any further bloody stools over the weekend or if she develops hypotension, lightheadedness, dizziness, or fatigue.  Plan for referral to GI for evaluation this week.  I discussed the above assessment and treatment plan with the patient. The patient was provided an opportunity to ask questions and all were answered. The patient agreed with the plan and demonstrated an understanding of the instructions.   The patient was advised to call back or seek an in-person evaluation if the symptoms worsen or if the condition fails to improve as anticipated.   I provided 5-10 minutes of face to face and non-face-to-face time during this encounter date.  Thayer Ohm, DNP, APRN, FNP-BC

## 2019-05-09 NOTE — Addendum Note (Signed)
Addended byChristen Butter on: 05/09/2019 08:47 AM   Modules accepted: Orders

## 2019-05-23 ENCOUNTER — Ambulatory Visit (INDEPENDENT_AMBULATORY_CARE_PROVIDER_SITE_OTHER): Payer: Medicare PPO | Admitting: Family Medicine

## 2019-05-23 ENCOUNTER — Encounter: Payer: Self-pay | Admitting: Family Medicine

## 2019-05-23 ENCOUNTER — Other Ambulatory Visit: Payer: Self-pay

## 2019-05-23 VITALS — BP 130/61 | HR 95 | Ht 64.0 in | Wt 220.0 lb

## 2019-05-23 DIAGNOSIS — D500 Iron deficiency anemia secondary to blood loss (chronic): Secondary | ICD-10-CM | POA: Diagnosis not present

## 2019-05-23 DIAGNOSIS — E669 Obesity, unspecified: Secondary | ICD-10-CM | POA: Diagnosis not present

## 2019-05-23 DIAGNOSIS — E1169 Type 2 diabetes mellitus with other specified complication: Secondary | ICD-10-CM | POA: Diagnosis not present

## 2019-05-23 DIAGNOSIS — R252 Cramp and spasm: Secondary | ICD-10-CM | POA: Diagnosis not present

## 2019-05-23 DIAGNOSIS — I10 Essential (primary) hypertension: Secondary | ICD-10-CM | POA: Diagnosis not present

## 2019-05-23 LAB — POCT GLYCOSYLATED HEMOGLOBIN (HGB A1C): Hemoglobin A1C: 6 % — AB (ref 4.0–5.6)

## 2019-05-23 NOTE — Patient Instructions (Signed)
Please try to schedule your mammogram this spring.

## 2019-05-23 NOTE — Assessment & Plan Note (Signed)
Well controlled. Continue current regimen. Follow up in  6 mo  

## 2019-05-23 NOTE — Assessment & Plan Note (Signed)
Well controlled. Much improved.  Continue current regimen. Follow up in  4 month.

## 2019-05-23 NOTE — Progress Notes (Signed)
Established Patient Office Visit  Subjective:  Patient ID: Shelby Thomas, female    DOB: 23-Jun-1945  Age: 74 y.o. MRN: [id number]  CC:  Chief Complaint  Patient presents with  . Diabetes    HPI Shelby Thomas presents for  Diabetes - no hypoglycemic events. No wounds or sores that are not healing well. No increased thirst or urination. Checking glucose at home. Taking medications as prescribed without any side effects.  Says she is cut out tea and soda.  And she is really cut back on some of her intake but says she is not skipping meals.  Hypertension- Pt denies chest pain, SOB, dizziness, or heart palpitations.  Taking meds as directed w/o problems.  Denies medication side effects.   He was recently seen for rectal bleeding.  Her hemoglobin was quite low at 7.4.  By the time we called her back she says it had stopped.  She does have a follow-up with GI on February 2.  She actually said she had a very similar episode back in 2015 and by the time she saw GI they did not find any specific cause and it never happened again until recently.  Does occasionally get some cramping in her hands and feet particularly at night.   Past Medical History:  Diagnosis Date  . CHF (congestive heart failure) (HCC)    mild  . Diabetes mellitus (HCC)   . Foraminal stenosis of lumbar region    L4--L5  . GERD (gastroesophageal reflux disease)   . Heart disease, congenital    large ASD, Sees Dr. Elmarie Shiley  . Hiatal hernia   . Hyperlipidemia    dyslipidemia  . Hypertension    pulmonary/on nighttime O2  . 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 OSA (obstructive sleep apnea)   . Pneumonia 2006  . Scoliosis    severe  . Spine misalignment    T spine 80% to right, L-Spine 55% to left    Past Surgical History:  Procedure Laterality Date  . CARDIAC SURGERY     repair of ASD  . ESOPHAGEAL DILATION  04-09   dilation/Dr. Inge Rise  . REPLACEMENT TOTAL KNEE Left 12/08/13   Dr. Dossie Arbour  . TOTAL HIP ARTHROPLASTY Left     Family History  Problem Relation Age of Onset  . Diabetes Mother   . Stroke Mother   . CAD Mother        CABG in her 85s    Social History   Socioeconomic History  . Marital status: Married    Spouse name: Not on file  . Number of children: 2  . Years of education: Not on file  . Highest education level: Not on file  Occupational History  . Not on file  Tobacco Use  . Smoking status: Never Smoker  . Smokeless tobacco: Never Used  Substance and Sexual Activity  . Alcohol use: No  . Drug use: Not on file  . 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   Other Topics Concern  . Not on file  Social History Narrative  . Not on file   Social Determinants of Health   Financial Resource Strain:   . Difficulty of Paying Living Expenses: Not on file  Food Insecurity:   . Worried About Programme researcher, broadcasting/film/video in the Last Year: Not on file  . Ran Out  of Food in the Last Year: Not on file  Transportation Needs:   . Lack of Transportation (Medical): Not on file  . Lack of Transportation (Non-Medical): Not on file  Physical Activity:   . Days of Exercise per Week: Not on file  . Minutes of Exercise per Session: Not on file  Stress:   . Feeling of Stress : Not on file  Social Connections:   . Frequency of Communication with Friends and Family: Not on file  . Frequency of Social Gatherings with Friends and Family: Not on file  . Attends Religious Services: Not on file  . Active Member of Clubs or Organizations: Not on file  . Attends Banker Meetings: Not on file  . Marital Status: Not on file  Intimate Partner Violence:   . Fear of Current or Ex-Partner: Not on file  . Emotionally Abused: Not on file  . Physically Abused: Not on file  . Sexually Abused: Not on file    Outpatient Medications Prior to Visit  Medication Sig Dispense  Refill  . AMBULATORY NON FORMULARY MEDICATION Medication Name: Free Style Insu[employer] test strips.  Use daily as directed 50 strip 12  . AMBULATORY NON FORMULARY MEDICATION Medication Name: Oxygen with CPAP qhs at 2L/min. Diagnosis: Pulmonary Hypertension, Dyspnea on Exertion 1 Units prn  . aspirin 81 MG tablet Take 81 mg by mouth daily.      . metFORMIN (GLUCOPHAGE) 500 MG tablet TAKE ONE TABLET BY MOUTH TWICE DAILY WITH A MEAL 180 tablet 1  . Multiple Vitamin (MULTIVITAMIN) capsule Take 1 capsule by mouth daily.      Marland Kitchen omeprazole (PRILOSEC) 40 MG capsule TAKE ONE CAPSULE BY MOUTH DAILY 30 capsule 11  . simvastatin (ZOCOR) 40 MG tablet Take 1 tablet (40 mg total) by mouth daily. 90 tablet 3  . furosemide (LASIX) 40 MG tablet TAKE ONE TABLET (40 MG TOTAL) BY MOUTH AS NEEDED 30 tablet 1  . ondansetron (ZOFRAN-ODT) 8 MG disintegrating tablet Take 1 tablet (8 mg total) by mouth every 8 (eight) hours as needed for nausea. 20 tablet 0  . potassium chloride (MICRO-K) 10 MEQ CR capsule TAKE ONE CAPSULE BY MOUTH ONCE DAILY AS NEEDED WHEN TAKING FUROSEMIDE 30 capsule 11   No facility-administered medications prior to visit.    Allergies  Allergen Reactions  . Ramipril     REACTION: cough  . Tramadol Other (See Comments)    Nausea    ROS Review of Systems    Objective:    Physical Exam  Constitutional: She is oriented to person, place, and time. She appears well-developed and well-nourished.  HENT:  Head: Normocephalic and atraumatic.  Cardiovascular: Normal rate, regular rhythm and normal heart sounds.  Pulmonary/Chest: Effort normal and breath sounds normal.  Neurological: She is alert and oriented to person, place, and time.  Skin: Skin is warm and dry.  Psychiatric: She has a normal mood and affect. Her behavior is normal.    BP 130/61   Pulse 95   Ht 5\' 4"  (1.626 m)   Wt 220 lb (99.8 kg)   SpO2 97%   BMI 37.76 kg/m  Wt Readings from Last 3 Encounters:  05/23/19 220 lb (99.8  kg)  05/05/19 220 lb (99.8 kg)  12/27/18 217 lb (98.4 kg)     Health Maintenance Due  Topic Date Due  . MAMMOGRAM  10/23/2017  . OPHTHALMOLOGY EXAM  10/15/2018  . URINE MICROALBUMIN  11/26/2018    There are no preventive care  reminders to display for this patient.  Lab Results  Component Value Date   TSH 0.78 11/25/2017   Lab Results  Component Value Date   WBC 11.0 (H) 05/05/2019   HGB 7.4 (L) 05/05/2019   HCT 23.2 (L) 05/05/2019   MCV 98.7 05/05/2019   PLT 259 05/05/2019   Lab Results  Component Value Date   NA 143 12/27/2018   K 4.3 12/27/2018   CO2 31 12/27/2018   GLUCOSE 110 (H) 12/27/2018   BUN 10 12/27/2018   CREATININE 0.66 12/27/2018   BILITOT 0.5 12/27/2018   ALKPHOS 90 10/14/2016   AST 14 12/27/2018   ALT 9 12/27/2018   PROT 6.9 12/27/2018   ALBUMIN 3.7 10/14/2016   CALCIUM 9.7 12/27/2018   Lab Results  Component Value Date   CHOL 177 12/27/2018   Lab Results  Component Value Date   HDL 56 12/27/2018   Lab Results  Component Value Date   LDLCALC 102 (H) 12/27/2018   Lab Results  Component Value Date   TRIG 92 12/27/2018   Lab Results  Component Value Date   CHOLHDL 3.2 12/27/2018   Lab Results  Component Value Date   HGBA1C 6.0 (A) 05/23/2019      Assessment & Plan:   Problem List Items Addressed This Visit      Cardiovascular and Mediastinum   HYPERTENSION, BENIGN ESSENTIAL    Well controlled. Continue current regimen. Follow up in  6 mo        Endocrine   Diabetes mellitus type 2 in obese [facility]) - Primary    Well controlled. Much improved.  Continue current regimen. Follow up in  4 month.       Relevant Orders   POCT glycosylated hemoglobin (Hb A1C) (Completed)    Other Visit Diagnoses    Anemia, blood loss       Relevant Orders   CBC with Differential/Platelet   Cramping of feet       Cramping of hands         Encouraged to schedule her mammogram.  Anemia secondary to GI blood loss - we will check CBC with  differential to make sure that her hemoglobin looks like it is improving.  She has follow-up with GI on February 2.  Cramping -could discuss potential options including potassium being off, increased activity, mild dehydration, medications etc.  Work on gentle stretching.  Recent potassium level was normal.  No orders of the defined types were placed in this encounter.   Follow-up: Return in about 4 months (around 09/20/2019) for Diabetes follow-up.     Beatrice Lecher, MD

## 2019-05-25 LAB — CBC WITH DIFFERENTIAL/PLATELET
Absolute Monocytes: 523 cells/uL (ref 200–950)
Basophils Absolute: 32 cells/uL (ref 0–200)
Basophils Relative: 0.5 %
Eosinophils Absolute: 69 cells/uL (ref 15–500)
Eosinophils Relative: 1.1 %
HCT: 25.4 % — ABNORMAL LOW (ref 35.0–45.0)
Hemoglobin: 7.7 g/dL — ABNORMAL LOW (ref 11.7–15.5)
Lymphs Abs: 1537 cells/uL (ref 850–3900)
MCH: 30.2 pg (ref 27.0–33.0)
MCHC: 30.3 g/dL — ABNORMAL LOW (ref 32.0–36.0)
MCV: 99.6 fL (ref 80.0–100.0)
MPV: 9.2 fL (ref 7.5–12.5)
Monocytes Relative: 8.3 %
Neutro Abs: 4139 cells/uL (ref 1500–7800)
Neutrophils Relative %: 65.7 %
Platelets: 387 10*3/uL (ref 140–400)
RBC: 2.55 10*6/uL — ABNORMAL LOW (ref 3.80–5.10)
RDW: 13.1 % (ref 11.0–15.0)
Total Lymphocyte: 24.4 %
WBC: 6.3 10*3/uL (ref 3.8–10.8)

## 2019-05-25 LAB — B12 AND FOLATE PANEL

## 2019-05-26 ENCOUNTER — Other Ambulatory Visit: Payer: Self-pay | Admitting: Family Medicine

## 2019-09-20 ENCOUNTER — Encounter: Payer: Self-pay | Admitting: Family Medicine

## 2019-09-20 ENCOUNTER — Other Ambulatory Visit: Payer: Self-pay

## 2019-09-20 ENCOUNTER — Ambulatory Visit (INDEPENDENT_AMBULATORY_CARE_PROVIDER_SITE_OTHER): Payer: Medicare PPO | Admitting: Family Medicine

## 2019-09-20 VITALS — BP 146/55 | HR 86 | Ht 64.0 in | Wt 221.0 lb

## 2019-09-20 DIAGNOSIS — E669 Obesity, unspecified: Secondary | ICD-10-CM

## 2019-09-20 DIAGNOSIS — I10 Essential (primary) hypertension: Secondary | ICD-10-CM | POA: Diagnosis not present

## 2019-09-20 DIAGNOSIS — E1169 Type 2 diabetes mellitus with other specified complication: Secondary | ICD-10-CM | POA: Diagnosis not present

## 2019-09-20 LAB — POCT UA - MICROALBUMIN
Albumin/Creatinine Ratio, Urine, POC: <30
Creatinine, POC: 200 mg/dL
Microalbumin Ur, POC: 30 mg/L

## 2019-09-20 LAB — POCT GLYCOSYLATED HEMOGLOBIN (HGB A1C): Hemoglobin A1C: 8.4 % — AB (ref 4.0–5.6)

## 2019-09-20 NOTE — Patient Instructions (Addendum)
BP goal is under 140/90.   Work on cutting back on soda and juice.   Try to find a low sugar peanut butter to use.  Goal for most of your fasting blood sugars to be under 130 in the morning before breakfast.  Sugar is too low if you glucose is less than 80.     Shopping  Avoid buying canned, premade, or processed foods. These foods tend to be high in fat, sodium, and added sugar.  Shop around the outside edge of the grocery store. This includes fresh fruits and vegetables, bulk grains, fresh meats, and fresh dairy. Cooking  Use low-heat cooking methods, such as baking, instead of high-heat cooking methods like deep frying.  Cook using healthy oils, such as olive, canola, or sunflower oil.  Avoid cooking with butter, cream, or high-fat meats. Meal planning  Eat meals and snacks regularly, preferably at the same times every day. Avoid going long periods of time without eating.  Eat foods high in fiber, such as fresh fruits, vegetables, beans, and whole grains. Talk to your dietitian about how many servings of carbs you can eat at each meal.  Eat 4-6 ounces (oz) of lean protein each day, such as lean meat, chicken, fish, eggs, or tofu. One oz of lean protein is equal to: ? 1 oz of meat, chicken, or fish. ? 1 egg. ?  cup of tofu.  Eat some foods each day that contain healthy fats, such as avocado, nuts, seeds, and fish.

## 2019-09-20 NOTE — Assessment & Plan Note (Signed)
BP mildly elevated today but looked great last time she was here.  .  REcheck in 3 months. She eats a low salt diet.

## 2019-09-20 NOTE — Assessment & Plan Note (Signed)
Not well controlled. No sure what has caused a big change in her A1C.  A1C is now 8.4.  Urine micro is negative.

## 2019-09-20 NOTE — Progress Notes (Signed)
Established Patient Office Visit  Subjective:  Patient ID: Shelby Thomas, female    DOB: [birth year]  Age: 74 y.o. MRN: [id number]  CC:  Chief Complaint  Patient presents with  . Diabetes    HPI Shelby Thomas presents for Hypertension- Pt denies chest pain, SOB, dizziness, or heart palpitations.  Taking meds as directed w/o problems.  Denies medication side effects.    Diabetes - no hypoglycemic events. No wounds or sores that are not healing well. No increased thirst or urination. Checking glucose at home. Taking medications as prescribed without any side effects. She says there have been no major changes in her diet or activity level.    Past Medical History:  Diagnosis Date  . CHF (congestive heart failure) (HCC)    mild  . Diabetes mellitus (HCC)   . Foraminal stenosis of lumbar region    L4--L5  . GERD (gastroesophageal reflux disease)   . Heart disease, congenital    large ASD, Sees Dr. Elmarie Shiley  . Hiatal hernia   . Hyperlipidemia    dyslipidemia  . Hypertension    pulmonary/on nighttime O2  . 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 OSA (obstructive sleep apnea)   . Pneumonia 2006  . Scoliosis    severe  . Spine misalignment    T spine 80% to right, L-Spine 55% to left    Past Surgical History:  Procedure Laterality Date  . CARDIAC SURGERY     repair of ASD  . ESOPHAGEAL DILATION  04-09   dilation/Dr. Inge Rise  . REPLACEMENT TOTAL KNEE Left 12/08/13   Dr. Dossie Arbour  . TOTAL HIP ARTHROPLASTY Left     Family History  Problem Relation Age of Onset  . Diabetes Mother   . Stroke Mother   . CAD Mother        CABG in her 21s    Social History   Socioeconomic History  . Marital status: Married    Spouse name: Not on file  . Number of children: 2  . Years of education: Not on file  . Highest education level: Not on file  Occupational History  . Not on file  Tobacco Use  . Smoking status: Never Smoker  . Smokeless tobacco: Never  Used  Substance and Sexual Activity  . Alcohol use: No  . Drug use: Not on file  . 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   Other Topics Concern  . Not on file  Social History Narrative  . Not on file   Social Determinants of Health   Financial Resource Strain:   . Difficulty of Paying Living Expenses:   Food Insecurity:   . Worried About Programme researcher, broadcasting/film/video in the Last Year:   . Barista in the Last Year:   Transportation Needs:   . Freight forwarder (Medical):   Marland Kitchen Lack of Transportation (Non-Medical):   Physical Activity:   . Days of Exercise per Week:   . Minutes of Exercise per Session:   Stress:   . Feeling of Stress :   Social Connections:   . Frequency of Communication with Friends and Family:   . Frequency of Social Gatherings with Friends and Family:   . Attends Religious Services:   . Active Member of Clubs or Organizations:   . Attends Banker Meetings:   .  Marital Status:   Intimate Partner Violence:   . Fear of Current or Ex-Partner:   . Emotionally Abused:   Marland Kitchen Physically Abused:   . Sexually Abused:     Outpatient Medications Prior to Visit  Medication Sig Dispense Refill  . AMBULATORY NON FORMULARY MEDICATION Medication Name: Free Style Insu[employer]x test strips.  Use daily as directed 50 strip 12  . AMBULATORY NON FORMULARY MEDICATION Medication Name: Oxygen with CPAP qhs at 2L/min. Diagnosis: Pulmonary Hypertension, Dyspnea on Exertion 1 Units prn  . aspirin 81 MG tablet Take 81 mg by mouth daily.      . metFORMIN (GLUCOPHAGE) 500 MG tablet TAKE ONE TABLET BY MOUTH TWICE DAILY WITH A MEAL 180 tablet 1  . Multiple Vitamin (MULTIVITAMIN) capsule Take 1 capsule by mouth daily.      Marland Kitchen omeprazole (PRILOSEC) 40 MG capsule TAKE ONE CAPSULE BY MOUTH DAILY 30 capsule 11  . simvastatin (ZOCOR) 40 MG tablet TAKE ONE TABLET BY MOUTH DAILY 90 tablet 3    No facility-administered medications prior to visit.    Allergies  Allergen Reactions  . Ramipril     REACTION: cough  . Tramadol Other (See Comments)    Nausea    ROS Review of Systems    Objective:    Physical Exam  Constitutional: She is oriented to person, place, and time. She appears well-developed and well-nourished.  HENT:  Head: Normocephalic and atraumatic.  Cardiovascular: Normal rate, regular rhythm and normal heart sounds.  Pulmonary/Chest: Effort normal and breath sounds normal.  Neurological: She is alert and oriented to person, place, and time.  Skin: Skin is warm and dry.  Psychiatric: She has a normal mood and affect. Her behavior is normal.    BP (!) 146/55   Pulse 86   Ht 5\' 4"  (1.626 m)   Wt 221 lb (100.2 kg)   SpO2 99%   BMI 37.93 kg/m  Wt Readings from Last 3 Encounters:  09/20/19 221 lb (100.2 kg)  05/23/19 220 lb (99.8 kg)  05/05/19 220 lb (99.8 kg)     There are no preventive care reminders to display for this patient.  There are no preventive care reminders to display for this patient.  Lab Results  Component Value Date   TSH 0.78 11/25/2017   Lab Results  Component Value Date   WBC 6.3 05/23/2019   HGB 7.7 (L) 05/23/2019   HCT 25.4 (L) 05/23/2019   MCV 99.6 05/23/2019   PLT 387 05/23/2019   Lab Results  Component Value Date   NA 143 12/27/2018   K 4.3 12/27/2018   CO2 31 12/27/2018   GLUCOSE 110 (H) 12/27/2018   BUN 10 12/27/2018   CREATININE 0.66 12/27/2018   BILITOT 0.5 12/27/2018   ALKPHOS 90 10/14/2016   AST 14 12/27/2018   ALT 9 12/27/2018   PROT 6.9 12/27/2018   ALBUMIN 3.7 10/14/2016   CALCIUM 9.7 12/27/2018   Lab Results  Component Value Date   CHOL 177 12/27/2018   Lab Results  Component Value Date   HDL 56 12/27/2018   Lab Results  Component Value Date   LDLCALC 102 (H) 12/27/2018   Lab Results  Component Value Date   TRIG 92 12/27/2018   Lab Results  Component Value Date   CHOLHDL 3.2  12/27/2018   Lab Results  Component Value Date   HGBA1C 8.4 (A) 09/20/2019      Assessment & Plan:   Problem List Items Addressed This Visit  Cardiovascular and Mediastinum   HYPERTENSION, BENIGN ESSENTIAL    BP mildly elevated today but looked great last time she was here.  .  REcheck in 3 months. She eats a low salt diet.          Endocrine   Diabetes mellitus type 2 in obese [facility]) - Primary    Not well controlled. No sure what has caused a big change in her A1C.  A1C is now 8.4.  Urine micro is negative.        Relevant Orders   POCT glycosylated hemoglobin (Hb A1C) (Completed)   POCT UA - Microalbumin (Completed)      No orders of the defined types were placed in this encounter.   Follow-up: Return in about 3 months (around 12/21/2019) for Diabetes follow-up.    Nani Gasser, MD

## 2019-09-23 ENCOUNTER — Other Ambulatory Visit: Payer: Self-pay | Admitting: Family Medicine

## 2019-09-23 DIAGNOSIS — I272 Pulmonary hypertension, unspecified: Secondary | ICD-10-CM

## 2019-11-23 ENCOUNTER — Other Ambulatory Visit: Payer: Self-pay | Admitting: Family Medicine

## 2019-12-21 ENCOUNTER — Encounter: Payer: Self-pay | Admitting: Family Medicine

## 2019-12-21 ENCOUNTER — Ambulatory Visit (INDEPENDENT_AMBULATORY_CARE_PROVIDER_SITE_OTHER): Payer: Medicare PPO | Admitting: Family Medicine

## 2019-12-21 VITALS — BP 139/52 | HR 81 | Ht 64.0 in | Wt 207.0 lb

## 2019-12-21 DIAGNOSIS — I10 Essential (primary) hypertension: Secondary | ICD-10-CM | POA: Diagnosis not present

## 2019-12-21 DIAGNOSIS — I739 Peripheral vascular disease, unspecified: Secondary | ICD-10-CM

## 2019-12-21 DIAGNOSIS — Z6835 Body mass index (BMI) 35.0-35.9, adult: Secondary | ICD-10-CM | POA: Diagnosis not present

## 2019-12-21 DIAGNOSIS — E669 Obesity, unspecified: Secondary | ICD-10-CM | POA: Diagnosis not present

## 2019-12-21 DIAGNOSIS — D539 Nutritional anemia, unspecified: Secondary | ICD-10-CM | POA: Diagnosis not present

## 2019-12-21 DIAGNOSIS — E1169 Type 2 diabetes mellitus with other specified complication: Secondary | ICD-10-CM | POA: Diagnosis not present

## 2019-12-21 LAB — COMPLETE METABOLIC PANEL WITH GFR
AG Ratio: 1.4 (calc) (ref 1.0–2.5)
ALT: 10 U/L (ref 6–29)
AST: 14 U/L (ref 10–35)
Albumin: 4.2 g/dL (ref 3.6–5.1)
Alkaline phosphatase (APISO): 56 U/L (ref 37–153)
BUN: 11 mg/dL (ref 7–25)
CO2: 32 mmol/L (ref 20–32)
Calcium: 9.6 mg/dL (ref 8.6–10.4)
Chloride: 103 mmol/L (ref 98–110)
Creat: 0.68 mg/dL (ref 0.60–0.93)
GFR, Est African American: 101 mL/min/{1.73_m2} (ref >=60)
GFR, Est Non African American: 87 mL/min/{1.73_m2} (ref >=60)
Globulin: 2.9 g/dL (calc) (ref 1.9–3.7)
Glucose, Bld: 105 mg/dL — ABNORMAL HIGH (ref 65–99)
Potassium: 4.3 mmol/L (ref 3.5–5.3)
Sodium: 142 mmol/L (ref 135–146)
Total Bilirubin: 0.4 mg/dL (ref 0.2–1.2)
Total Protein: 7.1 g/dL (ref 6.1–8.1)

## 2019-12-21 LAB — CBC
HCT: 40.5 % (ref 35.0–45.0)
Hemoglobin: 13.2 g/dL (ref 11.7–15.5)
MCH: 31.7 pg (ref 27.0–33.0)
MCHC: 32.6 g/dL (ref 32.0–36.0)
MCV: 97.4 fL (ref 80.0–100.0)
MPV: 9.7 fL (ref 7.5–12.5)
Platelets: 268 10*3/uL (ref 140–400)
RBC: 4.16 10*6/uL (ref 3.80–5.10)
RDW: 12.6 % (ref 11.0–15.0)
WBC: 5.5 10*3/uL (ref 3.8–10.8)

## 2019-12-21 LAB — B12 AND FOLATE PANEL
Folate: 23.4 ng/mL
Vitamin B-12: 617 pg/mL (ref 200–1100)

## 2019-12-21 LAB — LIPID PANEL
Cholesterol: 172 mg/dL (ref <200)
HDL: 53 mg/dL (ref >=50)
LDL Cholesterol (Calc): 102 mg/dL (calc) — ABNORMAL HIGH
Non-HDL Cholesterol (Calc): 119 mg/dL (calc) (ref <130)
Total CHOL/HDL Ratio: 3.2 (calc) (ref <5.0)
Triglycerides: 83 mg/dL (ref <150)

## 2019-12-21 LAB — POCT GLYCOSYLATED HEMOGLOBIN (HGB A1C): Hemoglobin A1C: 6.3 % — AB (ref 4.0–5.6)

## 2019-12-21 NOTE — Assessment & Plan Note (Signed)
Blood pressure is a little borderline, but we will keep an eye on it.  Just continue work on [employer] and we discussed the importance of just continuing to move stretching her legs and arms and doing small walks throughout the day.

## 2019-12-21 NOTE — Assessment & Plan Note (Signed)
Asymptomatic.  Continue statin and aspirin daily.

## 2019-12-21 NOTE — Assessment & Plan Note (Signed)
A1c looks fantastic today compared to previous she is really done a great job in bringing that down and making some dietary changes.  Continue to stick with those changes and continue to work on trying to increase mobility. She has lost about 14 pounds since she was last here so I do want to keep an eye on that and make sure that she does not continue to lose weight at an abnormally quick rate.  We discussed the importance of making sure that she is moving and getting off protein so that she is not getting muscle wasting with this weight loss.

## 2019-12-21 NOTE — Progress Notes (Signed)
Established Patient Office Visit  Subjective:  Patient ID: Shelby Thomas, female    DOB: 05-30-1945  Age: 74 y.o. MRN: [id number]  CC:  Chief Complaint  Patient presents with  . Diabetes    HPI Shelby Thomas presents for   Diabetes - no hypoglycemic events. No wounds or sores that are not healing well. No increased thirst or urination. Checking glucose at home. Taking medications as prescribed without any side effects.  She denies any major dietary changes though she has tried to cut back on eating sweets.  She is also cut back on some of her fruit intake.  Her husband is concerned that she does not eat enough but she says she is not skipping meals though she usually only eats twice a day and sometimes will have a midday snack.  She says she just eats small amounts.  She denies any lower extremity swelling.  Follow-up hypertension-no recent chest pain, shortness of breath etc.  Past Medical History:  Diagnosis Date  . CHF (congestive heart failure) (HCC)    mild  . Diabetes mellitus (HCC)   . Foraminal stenosis of lumbar region    L4--L5  . GERD (gastroesophageal reflux disease)   . Heart disease, congenital    large ASD, Sees Dr. Elmarie Shiley  . Hiatal hernia   . Hyperlipidemia    dyslipidemia  . Hypertension    pulmonary/on nighttime O2  . 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 OSA (obstructive sleep apnea)   . Pneumonia 2006  . Scoliosis    severe  . Spine misalignment    T spine 80% to right, L-Spine 55% to left    Past Surgical History:  Procedure Laterality Date  . CARDIAC SURGERY     repair of ASD  . ESOPHAGEAL DILATION  04-09   dilation/Dr. Inge Rise  . REPLACEMENT TOTAL KNEE Left 12/08/13   Dr. Dossie Arbour  . TOTAL HIP ARTHROPLASTY Left     Family History  Problem Relation Age of Onset  . Diabetes Mother   . Stroke Mother   . CAD Mother        CABG in her 97s    Social History   Socioeconomic History  . Marital status: Married     Spouse name: Not on file  . Number of children: 2  . Years of education: Not on file  . Highest education level: Not on file  Occupational History  . Not on file  Tobacco Use  . Smoking status: Never Smoker  . Smokeless tobacco: Never Used  Substance and Sexual Activity  . Alcohol use: No  . Drug use: Not on file  . 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   Other Topics Concern  . Not on file  Social History Narrative  . Not on file   Social Determinants of Health   Financial Resource Strain:   . Difficulty of Paying Living Expenses:   Food Insecurity:   . Worried About Programme researcher, broadcasting/film/video in the Last Year:   . Barista in the Last Year:   Transportation Needs:   . Freight forwarder (Medical):   Marland Kitchen Lack of Transportation (Non-Medical):   Physical Activity:   . Days of Exercise per Week:   . Minutes of Exercise per Session:   Stress:   . Feeling of Stress :  Social Connections:   . Frequency of Communication with Friends and Family:   . Frequency of Social Gatherings with Friends and Family:   . Attends Religious Services:   . Active Member of Clubs or Organizations:   . Attends Banker Meetings:   Marland Kitchen Marital Status:   Intimate Partner Violence:   . Fear of Current or Ex-Partner:   . Emotionally Abused:   Marland Kitchen Physically Abused:   . Sexually Abused:     Outpatient Medications Prior to Visit  Medication Sig Dispense Refill  . AMBULATORY NON FORMULARY MEDICATION Medication Name: Free Style [employer]su[employer]x test strips.  Use daily as directed 50 strip 12  . AMBULATORY NON FORMULARY MEDICATION Medication Name: Oxygen with CPAP qhs at 2L/min. Diagnosis: Pulmonary Hypertension, Dyspnea on Exertion 1 Units prn  . aspirin 81 MG tablet Take 81 mg by mouth daily.      . furosemide (LASIX) 40 MG tablet TAKE ONE TABLET (40 MG TOTAL) BY MOUTH AS NEEDED 30 tablet 1  .  metFORMIN (GLUCOPHAGE) 500 MG tablet TAKE ONE TABLET BY MOUTH TWICE DAILY WITH A MEAL 180 tablet 1  . Multiple Vitamin (MULTIVITAMIN) capsule Take 1 capsule by mouth daily.      Marland Kitchen omeprazole (PRILOSEC) 40 MG capsule TAKE ONE CAPSULE BY MOUTH DAILY 30 capsule 11  . simvastatin (ZOCOR) 40 MG tablet TAKE ONE TABLET BY MOUTH DAILY 90 tablet 3   No facility-administered medications prior to visit.    Allergies  Allergen Reactions  . Ramipril     REACTION: cough  . Tramadol Other (See Comments)    Nausea    ROS Review of Systems    Objective:    Physical Exam Constitutional:      Appearance: She is well-developed.  HENT:     Head: Normocephalic and atraumatic.  Cardiovascular:     Rate and Rhythm: Normal rate and regular rhythm.     Heart sounds: Normal heart sounds.  Pulmonary:     Effort: Pulmonary effort is normal.     Breath sounds: Normal breath sounds.  Musculoskeletal:     Comments: NO LE edema.   Skin:    General: Skin is warm and dry.  Neurological:     Mental Status: She is alert and oriented to person, place, and time.  Psychiatric:        Behavior: Behavior normal.     BP (!) 139/52   Pulse 81   Ht 5\' 4"  (1.626 m)   Wt 207 lb (93.9 kg)   SpO2 96%   BMI 35.53 kg/m  Wt Readings from Last 3 Encounters:  12/21/19 207 lb (93.9 kg)  09/20/19 221 lb (100.2 kg)  05/23/19 220 lb (99.8 kg)     Health Maintenance Due  Topic Date Due  . INFLUENZA VACCINE  12/04/2019    There are no preventive care reminders to display for this patient.  Lab Results  Component Value Date   TSH 0.78 11/25/2017   Lab Results  Component Value Date   WBC 6.3 05/23/2019   HGB 7.7 (L) 05/23/2019   HCT 25.4 (L) 05/23/2019   MCV 99.6 05/23/2019   PLT 387 05/23/2019   Lab Results  Component Value Date   NA 143 12/27/2018   K 4.3 12/27/2018   CO2 31 12/27/2018   GLUCOSE 110 (H) 12/27/2018   BUN 10 12/27/2018   CREATININE 0.66 12/27/2018   BILITOT 0.5 12/27/2018    ALKPHOS 90 10/14/2016   AST 14 12/27/2018  ALT 9 12/27/2018   PROT 6.9 12/27/2018   ALBUMIN 3.7 10/14/2016   CALCIUM 9.7 12/27/2018   Lab Results  Component Value Date   CHOL 177 12/27/2018   Lab Results  Component Value Date   HDL 56 12/27/2018   Lab Results  Component Value Date   LDLCALC 102 (H) 12/27/2018   Lab Results  Component Value Date   TRIG 92 12/27/2018   Lab Results  Component Value Date   CHOLHDL 3.2 12/27/2018   Lab Results  Component Value Date   HGBA1C 6.3 (A) 12/21/2019      Assessment & Plan:   Problem List Items Addressed This Visit      Cardiovascular and Mediastinum   PVD (peripheral vascular disease) (HCC)    Asymptomatic.  Continue statin and aspirin daily.      HYPERTENSION, BENIGN ESSENTIAL    Blood pressure is a little borderline, but we will keep an eye on it.  Just continue work on [employer] and we discussed the importance of just continuing to move stretching her legs and arms and doing small walks throughout the day.      Relevant Orders   COMPLETE METABOLIC PANEL WITH GFR   Lipid panel   CBC   B12 and Folate Panel     Endocrine   Diabetes mellitus type 2 in obese (HCC) - Primary    A1c looks fantastic today compared to previous she is really done a great job in bringing that down and making some dietary changes.  Continue to stick with those changes and continue to work on trying to increase mobility. She has lost about 14 pounds since she was last here so I do want to keep an eye on that and make sure that she does not continue to lose weight at an abnormally quick rate.  We discussed the importance of making sure that she is moving and getting off protein so that she is not getting muscle wasting with this weight loss.      Relevant Orders   POCT glycosylated hemoglobin (Hb A1C) (Completed)   COMPLETE METABOLIC PANEL WITH GFR   Lipid panel   CBC   B12 and Folate Panel    Other Visit Diagnoses    Macrocytic anemia        Relevant Orders   CBC   B12 and Folate Panel   BMI 35.0-35.9,adult         PHQ9 SCORE ONLY 12/21/2019 12/27/2018 08/26/2018  PHQ-9 Total Score 27 0 0   Macrocytic anemia-due to recheck hemoglobin.  We have tried to add folate and B12 to her labs last time and they were unable to add it so we will run at this time.     No orders of the defined types were placed in this encounter.   Follow-up: Return in about 3 months (around 03/22/2020) for Diabetes follow-up.   Time spent 30 minutes in encounter.  Nani Gasser, MD

## 2019-12-28 LAB — HM DIABETES EYE EXAM

## 2020-01-11 ENCOUNTER — Ambulatory Visit (INDEPENDENT_AMBULATORY_CARE_PROVIDER_SITE_OTHER): Payer: Medicare PPO | Admitting: Family Medicine

## 2020-01-11 ENCOUNTER — Encounter: Payer: Self-pay | Admitting: Family Medicine

## 2020-01-11 ENCOUNTER — Other Ambulatory Visit: Payer: Self-pay

## 2020-01-11 VITALS — BP 118/44 | HR 81 | Ht 64.0 in | Wt 207.0 lb

## 2020-01-11 DIAGNOSIS — R197 Diarrhea, unspecified: Secondary | ICD-10-CM | POA: Diagnosis not present

## 2020-01-11 DIAGNOSIS — Z23 Encounter for immunization: Secondary | ICD-10-CM

## 2020-01-11 NOTE — Progress Notes (Signed)
Pt reports that her sxs started 5 days ago.  She was nauseated only on Saturday. On Sunday she stated that after church she began experiencing abdominal cramping like right before a bowel movement and when she went to the bathroom and had an episode of diarrhea that was bright red.she did experience some dizziness while she was sitting on the toilet. She felt sweaty. Denies any SOB,palpitations,headache,chills.she denies eating prior to this.

## 2020-01-11 NOTE — Patient Instructions (Signed)
Bloody Diarrhea Bloody diarrhea is frequent loose and watery bowel movements that contain blood. The blood can be hard to see or notice (occult). Bloody diarrhea may be caused by medical conditions such as:  Ulcerative colitis.  Crohn's disease.  Intestinal infection.  Viral gastroenteritis or bacterial gastroenteritis. Finding out why there is blood in your diarrhea is necessary so that your health care provider can prescribe the right treatment for you. Follow the instructions from your health care provider about treating the cause of your bloody diarrhea. Any type of diarrhea can make you feel weak and dehydrated. Dehydration can make you tired and thirsty, cause you to have a dry mouth, and decrease how often you urinate. Follow these instructions at home: Eating and drinking     Follow these recommendations as told by your health care provider:  Take an oral rehydration solution (ORS). This is an over-the-counter medicine that helps return your body to its normal balance of nutrients and water. It is found at pharmacies and retail stores.  Drink enough fluid to keep your urine pale yellow. ? Drink fluids such as water, ice chips, diluted fruit juice, and low-calorie sports drinks. You can also drink milk products, if desired. ? Avoid drinking fluids that contain a lot of sugar or caffeine, such as energy drinks, regular sports drinks, and soda. ? Avoid alcohol.  Eat bland, easy-to-digest foods in small amounts as you are able. These foods include bananas, applesauce, rice, lean meats, toast, and crackers.  Avoid spicy or fatty foods.  Medicines  Take over-the-counter and prescription medicines only as told by your health care provider. ? Your health care provider may prescribe medicine to slow down the frequency of diarrhea or to ease stomach discomfort.  If you were prescribed an antibiotic medicine, take it as told by your health care provider. Do not stop using the  antibiotic even if you start to feel better. General instructions   Wash your hands often using soap and water. If soap and water are not available, use a hand sanitizer. Others in the household should wash their hands as well. Hands should be washed: ? After using the toilet or changing a diaper. ? Before preparing, cooking, or serving food. ? While caring for a sick person or while visiting someone in a hospital.  Rest at home while you recover.  Take a warm bath to relieve any burning or pain from frequent diarrhea episodes.  Watch your condition for any changes.  Keep all follow-up visits as told by your health care provider. This is important. Contact a health care provider if:  You have a fever.  Your diarrhea gets worse.  You have new symptoms.  You cannot keep fluids down.  You feel light-headed or dizzy.  You have a headache.  You have muscle cramps. Get help right away if:  You have chest pain.  You feel extremely weak or you faint.  The blood in your diarrhea increases or turns a different color.  You vomit and the vomit is bloody or looks black.  You have persistent diarrhea.  You have severe pain, cramping, or bloating in your abdomen.  You have trouble breathing or you are breathing very quickly.  Your heart is beating very quickly.  Your skin feels cold and clammy.  You feel confused.  You have signs of dehydration, such as: ? Dark urine, very little urine, or no urine. ? Cracked lips. ? Dry mouth. ? Sunken eyes. ? Sleepiness. ? Weakness. Summary    Bloody diarrhea is frequent loose and watery bowel movements that contain blood. The blood can be hard to see or notice (occult).  Follow the instructions from your health care provider about treating the cause of your bloody diarrhea.  Any type of diarrhea can make you feel weak and dehydrated.  Follow your health care provider's recommendations for eating and drinking and for taking  medicines.  Contact your health care provider if your symptoms get worse. Get help right away if you have signs of dehydration. This information is not intended to replace advice given to you by your health care provider. Make sure you discuss any questions you have with your health care provider. Document Revised: 10/01/2017 Document Reviewed: 10/01/2017 Elsevier Patient Education  2020 [employer]  

## 2020-01-11 NOTE — Progress Notes (Signed)
Acute Office Visit  Subjective:    Patient ID: Shelby Thomas, female    DOB: [birth year], 74 y.o.   MRN: [id number]  Chief Complaint  Patient presents with  . Rectal Bleeding    HPI Patient is in today bloody diarrhea.  She was nauseated only on Saturday. Felt like she wanted to vomit after she ate a piece of fruit. Then on Sunday after church she began experiencing abdominal cramping like right before a bowel movement and when she went to the bathroom and had an episode of diarrhea that was bright red .she did experience some dizziness and sweating while she was sitting on the toilet. Checked her BP right after and it was low at 96/56. Denies any SOB,palpitations,headache,chills.she denies eating prior to this.  Then on Monday had diarrhea as well but with more dark colored blood. No rectal bleeding between BM.  Yesterday had diarrhea but no blood.  No sick contacts.   No SOB.  C/O foul smelling stools.   Past Medical History:  Diagnosis Date  . CHF (congestive heart failure) (HCC)    mild  . Diabetes mellitus (HCC)   . Foraminal stenosis of lumbar region    L4--L5  . GERD (gastroesophageal reflux disease)   . Heart disease, congenital    large ASD, Sees Dr. Elmarie Shiley  . Hiatal hernia   . Hyperlipidemia    dyslipidemia  . Hypertension    pulmonary/on nighttime O2  . 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 OSA (obstructive sleep apnea)   . Pneumonia 2006  . Scoliosis    severe  . Spine misalignment    T spine 80% to right, L-Spine 55% to left    Past Surgical History:  Procedure Laterality Date  . CARDIAC SURGERY     repair of ASD  . ESOPHAGEAL DILATION  04-09   dilation/Dr. Inge Rise  . REPLACEMENT TOTAL KNEE Left 12/08/13   Dr. Dossie Arbour  . TOTAL HIP ARTHROPLASTY Left     Family History  Problem Relation Age of Onset  . Diabetes Mother   . Stroke Mother   . CAD Mother        CABG in her 40s    Social History   Socioeconomic History  .  Marital status: Married    Spouse name: Not on file  . Number of children: 2  . Years of education: Not on file  . Highest education level: Not on file  Occupational History  . Not on file  Tobacco Use  . Smoking status: Never Smoker  . Smokeless tobacco: Never Used  Substance and Sexual Activity  . Alcohol use: No  . Drug use: Not on file  . 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   Other Topics Concern  . Not on file  Social History Narrative  . Not on file   Social Determinants of Health   Financial Resource Strain:   . Difficulty of Paying Living Expenses: Not on file  Food Insecurity:   . Worried About Programme researcher, broadcasting/film/video in the Last Year: Not on file  . Ran Out of Food in the Last Year: Not on file  Transportation Needs:   . Lack of Transportation (Medical): Not on file  . Lack of Transportation (Non-Medical): Not on file  Physical Activity:   . Days of Exercise per Week: Not on file  .  Minutes of Exercise per Session: Not on file  Stress:   . Feeling of Stress : Not on file  Social Connections:   . Frequency of Communication with Friends and Family: Not on file  . Frequency of Social Gatherings with Friends and Family: Not on file  . Attends Religious Services: Not on file  . Active Member of Clubs or Organizations: Not on file  . Attends Banker Meetings: Not on file  . Marital Status: Not on file  Intimate Partner Violence:   . Fear of Current or Ex-Partner: Not on file  . Emotionally Abused: Not on file  . Physically Abused: Not on file  . Sexually Abused: Not on file    Outpatient Medications Prior to Visit  Medication Sig Dispense Refill  . AMBULATORY NON FORMULARY MEDICATION Medication Name: Free Style InsuLinx test strips.  Use daily as directed 50 strip 12  . AMBULATORY NON FORMULARY MEDICATION Medication Name: Oxygen with CPAP qhs at 2L/min.  Diagnosis: Pulmonary Hypertension, Dyspnea on Exertion 1 Units prn  . aspirin 81 MG tablet Take 81 mg by mouth daily.      . furosemide (LASIX) 40 MG tablet TAKE ONE TABLET (40 MG TOTAL) BY MOUTH AS NEEDED 30 tablet 1  . metFORMIN (GLUCOPHAGE) 500 MG tablet TAKE ONE TABLET BY MOUTH TWICE DAILY WITH A MEAL 180 tablet 1  . Multiple Vitamin (MULTIVITAMIN) capsule Take 1 capsule by mouth daily.      Marland Kitchen omeprazole (PRILOSEC) 40 MG capsule TAKE ONE CAPSULE BY MOUTH DAILY 30 capsule 11  . simvastatin (ZOCOR) 40 MG tablet TAKE ONE TABLET BY MOUTH DAILY 90 tablet 3   No facility-administered medications prior to visit.    Allergies  Allergen Reactions  . Ramipril     REACTION: cough  . Tramadol Other (See Comments)    Nausea    Review of Systems     Objective:    Physical Exam Constitutional:      Appearance: She is well-developed.  HENT:     Head: Normocephalic and atraumatic.  Cardiovascular:     Rate and Rhythm: Normal rate and regular rhythm.     Heart sounds: Normal heart sounds.  Pulmonary:     Effort: Pulmonary effort is normal.     Breath sounds: Normal breath sounds.  Abdominal:     General: Abdomen is flat. Bowel sounds are normal.     Palpations: Abdomen is soft. There is no mass.     Tenderness: There is no abdominal tenderness.  Skin:    General: Skin is warm and dry.  Neurological:     Mental Status: She is alert and oriented to person, place, and time.  Psychiatric:        Behavior: Behavior normal.     BP (!) 118/44   Pulse 81   Ht 5\' 4"  (1.626 m)   Wt 207 lb (93.9 kg)   SpO2 100%   BMI 35.53 kg/m  Wt Readings from Last 3 Encounters:  01/11/20 207 lb (93.9 kg)  12/21/19 207 lb (93.9 kg)  09/20/19 221 lb (100.2 kg)    Health Maintenance Due  Topic Date Due  . OPHTHALMOLOGY EXAM  10/15/2018    There are no preventive care reminders to display for this patient.   Lab Results  Component Value Date   TSH 0.78 11/25/2017   Lab Results   Component Value Date   WBC 5.5 12/21/2019   HGB 13.2 12/21/2019   HCT 40.5 12/21/2019  MCV 97.4 12/21/2019   PLT 268 12/21/2019   Lab Results  Component Value Date   NA 142 12/21/2019   K 4.3 12/21/2019   CO2 32 12/21/2019   GLUCOSE 105 (H) 12/21/2019   BUN 11 12/21/2019   CREATININE 0.68 12/21/2019   BILITOT 0.4 12/21/2019   ALKPHOS 90 10/14/2016   AST 14 12/21/2019   ALT 10 12/21/2019   PROT 7.1 12/21/2019   ALBUMIN 3.7 10/14/2016   CALCIUM 9.6 12/21/2019   Lab Results  Component Value Date   CHOL 172 12/21/2019   Lab Results  Component Value Date   HDL 53 12/21/2019   Lab Results  Component Value Date   LDLCALC 102 (H) 12/21/2019   Lab Results  Component Value Date   TRIG 83 12/21/2019   Lab Results  Component Value Date   CHOLHDL 3.2 12/21/2019   Lab Results  Component Value Date   HGBA1C 6.3 (A) 12/21/2019       Assessment & Plan:   Problem List Items Addressed This Visit    None    Visit Diagnoses    Need for immunization against influenza    -  Primary   Relevant Orders   Flu Vaccine QUAD High Dose(Fluad) (Completed)   Bloody diarrhea       Relevant Orders   Stool culture   Clostridium difficile Toxin B, Qualitative, Real-Time PCR     Bloody diarrhea - consider infectious. Non tender abdominal exam. Will do a stool culture. No fever and blood has resolved. Consider other things such as ruptured AVM.  Call if diarrhea is not resolved.  Check for c diff.      No orders of the defined types were placed in this encounter.    Nani Gasser, MD

## 2020-01-13 DIAGNOSIS — R197 Diarrhea, unspecified: Secondary | ICD-10-CM | POA: Diagnosis not present

## 2020-01-17 LAB — SALMONELLA/SHIGELLA CULT, CAMPY EIA AND SHIGA TOXIN RFL ECOLI
MICRO NUMBER: 10937791
MICRO NUMBER:: 10937792
MICRO NUMBER:: 10937793
Result:: NOT DETECTED
SHIGA RESULT:: NOT DETECTED
SPECIMEN QUALITY: ADEQUATE
SPECIMEN QUALITY:: ADEQUATE
SPECIMEN QUALITY:: ADEQUATE

## 2020-01-17 LAB — CLOSTRIDIUM DIFFICILE TOXIN B, QUALITATIVE, REAL-TIME PCR: Toxigenic C. Difficile by PCR: NOT DETECTED

## 2020-03-22 ENCOUNTER — Telehealth: Payer: Self-pay | Admitting: Family Medicine

## 2020-03-22 ENCOUNTER — Encounter: Payer: Self-pay | Admitting: Family Medicine

## 2020-03-22 ENCOUNTER — Ambulatory Visit (INDEPENDENT_AMBULATORY_CARE_PROVIDER_SITE_OTHER): Payer: Medicare PPO | Admitting: Family Medicine

## 2020-03-22 ENCOUNTER — Other Ambulatory Visit: Payer: Self-pay

## 2020-03-22 VITALS — BP 134/65 | HR 77 | Ht 64.0 in | Wt 204.0 lb

## 2020-03-22 DIAGNOSIS — I272 Pulmonary hypertension, unspecified: Secondary | ICD-10-CM

## 2020-03-22 DIAGNOSIS — E669 Obesity, unspecified: Secondary | ICD-10-CM | POA: Diagnosis not present

## 2020-03-22 DIAGNOSIS — Z780 Asymptomatic menopausal state: Secondary | ICD-10-CM | POA: Diagnosis not present

## 2020-03-22 DIAGNOSIS — E1169 Type 2 diabetes mellitus with other specified complication: Secondary | ICD-10-CM

## 2020-03-22 DIAGNOSIS — Z8774 Personal history of (corrected) congenital malformations of heart and circulatory system: Secondary | ICD-10-CM | POA: Diagnosis not present

## 2020-03-22 DIAGNOSIS — I10 Essential (primary) hypertension: Secondary | ICD-10-CM | POA: Diagnosis not present

## 2020-03-22 LAB — POCT GLYCOSYLATED HEMOGLOBIN (HGB A1C): Hemoglobin A1C: 6.5 % — AB (ref 4.0–5.6)

## 2020-03-22 MED ORDER — METFORMIN HCL ER (OSM) 500 MG PO TB24: 500.0000 mg | ORAL_TABLET | Freq: Every day | ORAL | 0 refills | Status: DC

## 2020-03-22 NOTE — Assessment & Plan Note (Signed)
Last evaluated by cardiology in 2019.  It was stable at that time.

## 2020-03-22 NOTE — Patient Instructions (Signed)
When starting an over-the-counter daily calcium with vitamin D supplement such as Caltrate D or Viactiv chews.

## 2020-03-22 NOTE — Progress Notes (Signed)
Established Patient Office Visit  Subjective:  Patient ID: Shelby Thomas, female    DOB: 08/07/1945  Age: 74 y.o. MRN: [id number]  CC:  Chief Complaint  Patient presents with  . Diabetes    HPI Shelby Thomas presents for   Diabetes - no hypoglycemic events. No wounds or sores that are not healing well. No increased thirst or urination. Checking glucose at home. Taking medications as prescribed without any side effects.  She is having some problems with the Metformin she says sometimes when he takes she takes her nighttime dose it makes her nauseated.  Its not every night but is just sometimes.  It is not causing any diarrhea.  She takes 500 mg twice a day.  Pulmonary hypertension-no recent new onset chest pain or shortness of breath.  Her cardiologist had recommend that she wear her home oxygen which she has not been doing.  Hypertension- Pt denies chest pain, SOB, dizziness, or heart palpitations.  Taking meds as directed w/o problems.  Denies medication side effects.      Past Medical History:  Diagnosis Date  . CHF (congestive heart failure) (HCC)    mild  . Diabetes mellitus (HCC)   . Foraminal stenosis of lumbar region    L4--L5  . GERD (gastroesophageal reflux disease)   . Heart disease, congenital    large ASD, Sees Dr. Elmarie Shiley  . Hiatal hernia   . Hyperlipidemia    dyslipidemia  . Hypertension    pulmonary/on nighttime O2  . 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 OSA (obstructive sleep apnea)   . Pneumonia 2006  . Scoliosis    severe  . Spine misalignment    T spine 80% to right, L-Spine 55% to left    Past Surgical History:  Procedure Laterality Date  . CARDIAC SURGERY     repair of ASD  . ESOPHAGEAL DILATION  04-09   dilation/Dr. Inge Rise  . REPLACEMENT TOTAL KNEE Left 12/08/13   Dr. Dossie Arbour  . TOTAL HIP ARTHROPLASTY Left     Family History  Problem Relation Age of Onset  . Diabetes Mother   . Stroke Mother   . CAD  Mother        CABG in her 63s    Social History   Socioeconomic History  . Marital status: Married    Spouse name: Not on file  . Number of children: 2  . Years of education: Not on file  . Highest education level: Not on file  Occupational History  . Not on file  Tobacco Use  . Smoking status: Never Smoker  . Smokeless tobacco: Never Used  Substance and Sexual Activity  . Alcohol use: No  . Drug use: Not on file  . 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   Other Topics Concern  . Not on file  Social History Narrative  . Not on file   Social Determinants of Health   Financial Resource Strain:   . Difficulty of Paying Living Expenses: Not on file  Food Insecurity:   . Worried About Programme researcher, broadcasting/film/video in the Last Year: Not on file  . Ran Out of Food in the Last Year: Not on file  Transportation Needs:   . Lack of Transportation (Medical): Not on file  . Lack of Transportation (Non-Medical): Not on file  Physical Activity:   .  Days of Exercise per Week: Not on file  . Minutes of Exercise per Session: Not on file  Stress:   . Feeling of Stress : Not on file  Social Connections:   . Frequency of Communication with Friends and Family: Not on file  . Frequency of Social Gatherings with Friends and Family: Not on file  . Attends Religious Services: Not on file  . Active Member of Clubs or Organizations: Not on file  . Attends Banker Meetings: Not on file  . Marital Status: Not on file  Intimate Partner Violence:   . Fear of Current or Ex-Partner: Not on file  . Emotionally Abused: Not on file  . Physically Abused: Not on file  . Sexually Abused: Not on file    Outpatient Medications Prior to Visit  Medication Sig Dispense Refill  . AMBULATORY NON FORMULARY MEDICATION Medication Name: Free Style InsuLinx test strips.  Use daily as directed 50 strip 12  .  AMBULATORY NON FORMULARY MEDICATION Medication Name: Oxygen with CPAP qhs at 2L/min. Diagnosis: Pulmonary Hypertension, Dyspnea on Exertion 1 Units prn  . aspirin 81 MG tablet Take 81 mg by mouth daily.      . furosemide (LASIX) 40 MG tablet TAKE ONE TABLET (40 MG TOTAL) BY MOUTH AS NEEDED 30 tablet 1  . Multiple Vitamin (MULTIVITAMIN) capsule Take 1 capsule by mouth daily.      Marland Kitchen omeprazole (PRILOSEC) 40 MG capsule TAKE ONE CAPSULE BY MOUTH DAILY 30 capsule 11  . simvastatin (ZOCOR) 40 MG tablet TAKE ONE TABLET BY MOUTH DAILY 90 tablet 3  . metFORMIN (GLUCOPHAGE) 500 MG tablet TAKE ONE TABLET BY MOUTH TWICE DAILY WITH A MEAL 180 tablet 1   No facility-administered medications prior to visit.    Allergies  Allergen Reactions  . Ramipril     REACTION: cough  . Tramadol Other (See Comments)    Nausea    ROS Review of Systems    Objective:    Physical Exam Constitutional:      Appearance: She is well-developed.  HENT:     Head: Normocephalic and atraumatic.  Cardiovascular:     Rate and Rhythm: Normal rate and regular rhythm.     Heart sounds: Normal heart sounds.  Pulmonary:     Effort: Pulmonary effort is normal.     Breath sounds: Normal breath sounds.  Skin:    General: Skin is warm and dry.  Neurological:     Mental Status: She is alert and oriented to person, place, and time.  Psychiatric:        Behavior: Behavior normal.     BP 134/65   Pulse 77   Ht 5\' 4"  (1.626 m)   Wt 204 lb (92.5 kg)   SpO2 97%   BMI 35.02 kg/m  Wt Readings from Last 3 Encounters:  03/22/20 204 lb (92.5 kg)  01/11/20 207 lb (93.9 kg)  12/21/19 207 lb (93.9 kg)     Health Maintenance Due  Topic Date Due  . OPHTHALMOLOGY EXAM  10/15/2018    There are no preventive care reminders to display for this patient.  Lab Results  Component Value Date   TSH 0.78 11/25/2017   Lab Results  Component Value Date   WBC 5.5 12/21/2019   HGB 13.2 12/21/2019   HCT 40.5 12/21/2019   MCV  97.4 12/21/2019   PLT 268 12/21/2019   Lab Results  Component Value Date   NA 142 12/21/2019   K 4.3 12/21/2019  CO2 32 12/21/2019   GLUCOSE 105 (H) 12/21/2019   BUN 11 12/21/2019   CREATININE 0.68 12/21/2019   BILITOT 0.4 12/21/2019   ALKPHOS 90 10/14/2016   AST 14 12/21/2019   ALT 10 12/21/2019   PROT 7.1 12/21/2019   ALBUMIN 3.7 10/14/2016   CALCIUM 9.6 12/21/2019   Lab Results  Component Value Date   CHOL 172 12/21/2019   Lab Results  Component Value Date   HDL 53 12/21/2019   Lab Results  Component Value Date   LDLCALC 102 (H) 12/21/2019   Lab Results  Component Value Date   TRIG 83 12/21/2019   Lab Results  Component Value Date   CHOLHDL 3.2 12/21/2019   Lab Results  Component Value Date   HGBA1C 6.5 (A) 03/22/2020      Assessment & Plan:   Problem List Items Addressed This Visit      Cardiovascular and Mediastinum   Pulmonary hypertension (HCC)    Last evaluated by cardiology in 2019.  It was stable at that time.      HYPERTENSION, BENIGN ESSENTIAL    Well controlled. Continue current regimen. Follow up in  3-4 months.          Endocrine   Diabetes mellitus type 2 in obese (HCC) - Primary    Hemoglobin A1c looks great today at 6.5 though up a little bit from previous of 6.3 in August.  Continue Metformin, statin and aspirin daily. Continue to work on healthy dietary choices low sugar and low carbs.      Relevant Medications   metformin (FORTAMET) 500 MG (OSM) 24 hr tablet   Other Relevant Orders   POCT glycosylated hemoglobin (Hb A1C) (Completed)     Other   H/O congenital atrial septal defect (ASD) repair    Other Visit Diagnoses    Postmenopausal       Relevant Orders   DG Bone Density      Also had a discussion today about bone health. She is not currently taking any calcium with vitamin D and her last bone density test was almost 10 years ago. I like to get that updated and did encourage her to either eat a calcium rich diet  or start taking of calcium with vitamin D supplement.  Meds ordered this encounter  Medications  . metformin (FORTAMET) 500 MG (OSM) 24 hr tablet    Sig: Take 1 tablet (500 mg total) by mouth daily with breakfast. OK to sub generic    Dispense:  30 tablet    Refill:  0    Follow-up: Return in about 4 months (around 07/20/2020) for Diabetes follow-up.    Nani Gasser, MD

## 2020-03-22 NOTE — Telephone Encounter (Signed)
Please call patient. I look back in the cardiology notes about 2 years ago and they had recommended that she make sure she is wearing her oxygen because of the hypertension and her lung blood vessels. This helps reduce that pressure so it is important that she wears it and if she does not usually come in here with it but may be she wears it at home but just please clarify with her.

## 2020-03-22 NOTE — Assessment & Plan Note (Signed)
Well controlled. Continue current regimen. Follow up in  3-4 months.  

## 2020-03-22 NOTE — Assessment & Plan Note (Addendum)
Hemoglobin A1c looks great today at 6.5 though up a little bit from previous of 6.3 in August.  Continue Metformin, statin and aspirin daily. Continue to work on healthy dietary choices low sugar and low carbs.

## 2020-03-23 NOTE — Telephone Encounter (Signed)
Spoke w/pt she stated that she wears the oxygen with her CPAP only. I asked her if she wore it anytime while awake and she stated that she does not.

## 2020-03-27 NOTE — Telephone Encounter (Signed)
Since it has been 2 years since she seen cardiology for this lets try to get her back and if she is okay with that we can get her back in with Dr. Jens Som downstairs or either someone at our [facility] location if that is closer.  I do want a make sure that we are treating her the best way that we possibly can.  Please see if she is okay with this.

## 2020-03-28 ENCOUNTER — Ambulatory Visit (INDEPENDENT_AMBULATORY_CARE_PROVIDER_SITE_OTHER): Payer: Medicare PPO | Admitting: Nurse Practitioner

## 2020-03-28 ENCOUNTER — Encounter: Payer: Self-pay | Admitting: Nurse Practitioner

## 2020-03-28 ENCOUNTER — Other Ambulatory Visit: Payer: Self-pay

## 2020-03-28 VITALS — BP 101/63 | HR 98 | Temp 98.1°F

## 2020-03-28 DIAGNOSIS — R000 Tachycardia, unspecified: Secondary | ICD-10-CM

## 2020-03-28 DIAGNOSIS — I10 Essential (primary) hypertension: Secondary | ICD-10-CM | POA: Diagnosis not present

## 2020-03-28 DIAGNOSIS — K625 Hemorrhage of anus and rectum: Secondary | ICD-10-CM | POA: Diagnosis not present

## 2020-03-28 DIAGNOSIS — D72829 Elevated white blood cell count, unspecified: Secondary | ICD-10-CM | POA: Diagnosis not present

## 2020-03-28 DIAGNOSIS — D649 Anemia, unspecified: Secondary | ICD-10-CM

## 2020-03-28 DIAGNOSIS — E1151 Type 2 diabetes mellitus with diabetic peripheral angiopathy without gangrene: Secondary | ICD-10-CM | POA: Diagnosis not present

## 2020-03-28 DIAGNOSIS — K5730 Diverticulosis of large intestine without perforation or abscess without bleeding: Secondary | ICD-10-CM | POA: Diagnosis not present

## 2020-03-28 DIAGNOSIS — T8089XA Other complications following infusion, transfusion and therapeutic injection, initial encounter: Secondary | ICD-10-CM | POA: Diagnosis not present

## 2020-03-28 DIAGNOSIS — R531 Weakness: Secondary | ICD-10-CM

## 2020-03-28 DIAGNOSIS — R933 Abnormal findings on diagnostic imaging of other parts of digestive tract: Secondary | ICD-10-CM | POA: Diagnosis not present

## 2020-03-28 DIAGNOSIS — R935 Abnormal findings on diagnostic imaging of other abdominal regions, including retroperitoneum: Secondary | ICD-10-CM | POA: Diagnosis not present

## 2020-03-28 DIAGNOSIS — I9589 Other hypotension: Secondary | ICD-10-CM | POA: Diagnosis not present

## 2020-03-28 DIAGNOSIS — G4733 Obstructive sleep apnea (adult) (pediatric): Secondary | ICD-10-CM | POA: Diagnosis not present

## 2020-03-28 DIAGNOSIS — K8020 Calculus of gallbladder without cholecystitis without obstruction: Secondary | ICD-10-CM | POA: Diagnosis not present

## 2020-03-28 DIAGNOSIS — K449 Diaphragmatic hernia without obstruction or gangrene: Secondary | ICD-10-CM | POA: Diagnosis not present

## 2020-03-28 DIAGNOSIS — Z9989 Dependence on other enabling machines and devices: Secondary | ICD-10-CM | POA: Diagnosis not present

## 2020-03-28 DIAGNOSIS — R42 Dizziness and giddiness: Secondary | ICD-10-CM | POA: Diagnosis not present

## 2020-03-28 DIAGNOSIS — K8689 Other specified diseases of pancreas: Secondary | ICD-10-CM | POA: Diagnosis not present

## 2020-03-28 DIAGNOSIS — K429 Umbilical hernia without obstruction or gangrene: Secondary | ICD-10-CM | POA: Diagnosis not present

## 2020-03-28 DIAGNOSIS — K219 Gastro-esophageal reflux disease without esophagitis: Secondary | ICD-10-CM | POA: Diagnosis not present

## 2020-03-28 DIAGNOSIS — E119 Type 2 diabetes mellitus without complications: Secondary | ICD-10-CM | POA: Diagnosis not present

## 2020-03-28 DIAGNOSIS — E876 Hypokalemia: Secondary | ICD-10-CM | POA: Diagnosis not present

## 2020-03-28 DIAGNOSIS — K922 Gastrointestinal hemorrhage, unspecified: Secondary | ICD-10-CM | POA: Diagnosis not present

## 2020-03-28 DIAGNOSIS — E861 Hypovolemia: Secondary | ICD-10-CM

## 2020-03-28 DIAGNOSIS — D62 Acute posthemorrhagic anemia: Secondary | ICD-10-CM | POA: Diagnosis not present

## 2020-03-28 DIAGNOSIS — K869 Disease of pancreas, unspecified: Secondary | ICD-10-CM | POA: Diagnosis not present

## 2020-03-28 DIAGNOSIS — K921 Melena: Secondary | ICD-10-CM | POA: Diagnosis not present

## 2020-03-28 DIAGNOSIS — I272 Pulmonary hypertension, unspecified: Secondary | ICD-10-CM | POA: Diagnosis not present

## 2020-03-28 LAB — POCT HEMOGLOBIN: Hemoglobin: 7.4 g/dL — AB (ref 11–14.6)

## 2020-03-28 NOTE — Patient Instructions (Addendum)
Dark red blood from rectum x 3 with diarrhea. 1 episode of vomiting last night.  No known fevers, chills present.  No abdominal pain present.  Hypotensive and tachycardic in the office today.  Hgb 7.4% in office today.    I would like you to go on to the hospital for evaluation- show them the information above.     Lower Gastrointestinal Bleeding  Lower gastrointestinal (GI) bleeding is the result of bleeding from the colon, rectum, or anal area. The colon is the last part of the digestive tract, where stool, also called feces, is formed. If you have lower GI bleeding, you may see blood in or on your stool. It may be bright red. Lower GI bleeding often stops without treatment. Continued or heavy bleeding needs emergency treatment at the hospital. What are the causes? Lower GI bleeding may be caused by:  A condition that causes pouches to form in the colon over time (diverticulosis).  Swelling and irritation (inflammation) in areas with diverticulosis (diverticulitis).  Inflammation of the colon (inflammatory bowel disease).  Swollen veins in the rectum (hemorrhoids).  Painful tears in the anus (anal fissures), often caused by passing hard stools.  Cancer of the colon or rectum.  Noncancerous growths (polyps) of the colon or rectum.  A bleeding disorder that impairs the formation of blood clots and causes easy bleeding (coagulopathy).  An abnormal weakening of a blood vessel where an artery and a vein come together (arteriovenous malformation). What increases the risk? You are more likely to develop this condition if:  You are older than 73 years of age.  You take aspirin or NSAIDs on a regular basis.  You take anticoagulant or antiplatelet drugs.  You have a history of high-dose X-ray treatment (radiation therapy) of the colon.  You recently had a colon polyp removed. What are the signs or symptoms? Symptoms of this condition include:  Bright red blood or blood  clots coming from your rectum.  Bloody stools.  Black or maroon-colored stools.  Pain or cramping in the abdomen.  Weakness or dizziness.  Racing heartbeat. How is this diagnosed? This condition may be diagnosed based on:  Your symptoms and medical history.  A physical exam. During the exam, your health care provider will check for signs of blood loss, such as low blood pressure and a rapid pulse.  Tests, such as: ? Flexible sigmoidoscopy. In this procedure, a flexible tube with a camera on the end is used to examine your anus and the first part of your colon to look for the source of bleeding. ? Colonoscopy. This is similar to a flexible sigmoidoscopy, but the camera can extend all the way to the uppermost part of your colon. ? Blood tests to measure your red blood cell count and to check for coagulopathy. ? An imaging study of your colon to look for a bleeding site. In some cases, you may have X-rays taken after a dye or radioactive substance is injected into your bloodstream (angiogram). How is this treated? Treatment for this condition depends on the cause of the bleeding. Heavy or persistent bleeding is treated at the hospital. Treatment may include:  Getting fluids through an IV tube inserted into one of your veins.  Getting blood through an IV tube (blood transfusion).  Stopping bleeding through high-heat coagulation, injections of certain medicines, or applying surgical clips. This can all be done during a colonoscopy.  Having a procedure that involves first doing an angiogram and then blocking blood flow to  the bleeding site (embolization).  Stopping some of your regular medicines for a certain amount of time.  Having surgery to remove part of the colon. This may be needed if bleeding is severe and does not respond to other treatment. Follow these instructions at home:  Take over-the-counter and prescription medicines only as told by your health care provider. You may  need to avoid aspirin, NSAIDs, or other medicines that increase bleeding.  Eat foods that are high in fiber. This will help keep your stools soft. These foods include whole grains, legumes, fruits, and vegetables. Eating 1-3 prunes each day works well for many people.  Drink enough fluid to keep your urine clear or pale yellow.  Keep all follow-up visits as told by your health care provider. This is important. Contact a health care provider if:  Your symptoms do not improve. Get help right away if:  Your bleeding increases.  You feel light-headed or you faint.  You feel weak.  You have severe cramps in your back or abdomen.  You pass large blood clots in your stool.  Your symptoms get worse. This information is not intended to replace advice given to you by your health care provider. Make sure you discuss any questions you have with your health care provider. Document Revised: 08/13/2018 Document Reviewed: 09/06/2015 Elsevier Patient Education  2020 [employer].

## 2020-03-28 NOTE — Telephone Encounter (Signed)
Patient advised of recommendations. She will call to schedule.

## 2020-03-28 NOTE — Progress Notes (Signed)
Acute Office Visit  Subjective:    Patient ID: Shelby Thomas, female    DOB: 07-29-1945, 74 y.o.   MRN: [id number]  Chief Complaint  Patient presents with  . Rectal Bleeding    HPI Shelby Thomas is a very pleasant 74 year old female presenting today with concerns of dark red blood per rectum that first started last night. She reports two episodes of diarrhea with "beet red" blood noted mixed throughout the stool and changing the water color in the bowl last night before bed and one additional episode this morning. She also tells me that she had one episode of emesis last night after eating part of her meal, she denies blood in the emesis. She reports eating a small breakfast this morning with no additional emesis.   Her husband tells me that he took her blood pressure last night and it was initially in the 150's systolic, but shortly after the second bowel movement and before bed she had dropped to the 90's systolic, but she denied any dizziness, weakness, or other symptoms at that time. He tells me her systolic was again 90 this morning when they made the appointment.   She reports that this has happened about 3 times in the past, the most recent being late summer of this year and then the other two a year ago and 2 years ago. She was not seen immediately for these episodes and reports that the bleeding stopped spontaneously. By the time she was evaluated, there were no abnormalities in her lab work. After the episode this summer she did have stool testing performed for bacteria, and this was negative.   She denies any pain, coffee ground emesis or black stools. Her last colonoscopy was several years ago- she is unable to recall if this was abnormal. She is not on any anticoagulation other than 81mg  ASA daily. She does have a history of GERD, but denies any symptoms recently. She has not been on any new medications and has not eaten anything with excessive red dye.   She does tell me that she  feels more weak and tired today. She also tells me that she has had some chills recently, but no known fever.    Past Medical History:  Diagnosis Date  . CHF (congestive heart failure) (HCC)    mild  . Diabetes mellitus (HCC)   . Foraminal stenosis of lumbar region    L4--L5  . GERD (gastroesophageal reflux disease)   . Heart disease, congenital    large ASD, Sees Dr.  . Hiatal hernia   . Hyperlipidemia    dyslipidemia  . Hypertension    pulmonary/on nighttime O2  . 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 OSA (obstructive sleep apnea)   . Pneumonia 2006  . Scoliosis    severe  . Spine misalignment    T spine 80% to right, L-Spine 55% to left    Past Surgical History:  Procedure Laterality Date  . CARDIAC SURGERY     repair of ASD  . ESOPHAGEAL DILATION  04-09   dilation/Dr. 06-09  . REPLACEMENT TOTAL KNEE Left 12/08/13   Dr. 02/07/14  . TOTAL HIP ARTHROPLASTY Left     Family History  Problem Relation Age of Onset  . Diabetes Mother   . Stroke Mother   . CAD Mother        CABG in her 19s    Social History   Socioeconomic History  .  Marital status: Married    Spouse name: Not on file  . Number of children: 2  . Years of education: Not on file  . Highest education level: Not on file  Occupational History  . Not on file  Tobacco Use  . Smoking status: Never Smoker  . Smokeless tobacco: Never Used  Substance and Sexual Activity  . Alcohol use: No  . Drug use: Not on file  . 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   Other Topics Concern  . Not on file  Social History Narrative  . Not on file   Social Determinants of Health   Financial Resource Strain:   . Difficulty of Paying Living Expenses: Not on file  Food Insecurity:   . Worried About Programme researcher, broadcasting/film/video in the Last Year: Not on file  . Ran Out of Food in the Last Year:  Not on file  Transportation Needs:   . Lack of Transportation (Medical): Not on file  . Lack of Transportation (Non-Medical): Not on file  Physical Activity:   . Days of Exercise per Week: Not on file  . Minutes of Exercise per Session: Not on file  Stress:   . Feeling of Stress : Not on file  Social Connections:   . Frequency of Communication with Friends and Family: Not on file  . Frequency of Social Gatherings with Friends and Family: Not on file  . Attends Religious Services: Not on file  . Active Member of Clubs or Organizations: Not on file  . Attends Banker Meetings: Not on file  . Marital Status: Not on file  Intimate Partner Violence:   . Fear of Current or Ex-Partner: Not on file  . Emotionally Abused: Not on file  . Physically Abused: Not on file  . Sexually Abused: Not on file    Outpatient Medications Prior to Visit  Medication Sig Dispense Refill  . AMBULATORY NON FORMULARY MEDICATION Medication Name: Free Style InsuLinx test strips.  Use daily as directed 50 strip 12  . AMBULATORY NON FORMULARY MEDICATION Medication Name: Oxygen with CPAP qhs at 2L/min. Diagnosis: Pulmonary Hypertension, Dyspnea on Exertion 1 Units prn  . aspirin 81 MG tablet Take 81 mg by mouth daily.      . furosemide (LASIX) 40 MG tablet TAKE ONE TABLET (40 MG TOTAL) BY MOUTH AS NEEDED 30 tablet 1  . metformin (FORTAMET) 500 MG (OSM) 24 hr tablet Take 1 tablet (500 mg total) by mouth daily with breakfast. OK to sub generic 30 tablet 0  . Multiple Vitamin (MULTIVITAMIN) capsule Take 1 capsule by mouth daily.      Marland Kitchen omeprazole (PRILOSEC) 40 MG capsule TAKE ONE CAPSULE BY MOUTH DAILY 30 capsule 11  . simvastatin (ZOCOR) 40 MG tablet TAKE ONE TABLET BY MOUTH DAILY 90 tablet 3   No facility-administered medications prior to visit.    Allergies  Allergen Reactions  . Ramipril     REACTION: cough  . Tramadol Other (See Comments)    Nausea    Review of Systems All review of  systems negative except what is listed in the HPI     Objective:    Physical Exam Vitals and nursing note reviewed.  Constitutional:      Appearance: She is ill-appearing.  HENT:     Head: Normocephalic.     Mouth/Throat:     Mouth: Mucous membranes are  dry.  Eyes:     Extraocular Movements: Extraocular movements intact.     Conjunctiva/sclera: Conjunctivae normal.     Pupils: Pupils are equal, round, and reactive to light.  Cardiovascular:     Rate and Rhythm: Tachycardia present.     Chest Wall: PMI is not displaced. No thrill.     Pulses: Normal pulses.     Heart sounds: Normal heart sounds.  Pulmonary:     Effort: Tachypnea present.     Breath sounds: Examination of the right-upper field reveals decreased breath sounds. Examination of the left-upper field reveals decreased breath sounds. Examination of the right-middle field reveals decreased breath sounds. Examination of the left-middle field reveals decreased breath sounds. Decreased breath sounds and wheezing present.  Abdominal:     General: Abdomen is flat. Bowel sounds are increased. There is no distension.     Palpations: Abdomen is soft. There is no mass.     Tenderness: There is no abdominal tenderness. There is no guarding or rebound.     Hernia: No hernia is present.  Musculoskeletal:     Cervical back: Normal range of motion.  Skin:    General: Skin is warm and dry.     Capillary Refill: Capillary refill takes less than 2 seconds.  Neurological:     General: No focal deficit present.     Mental Status: She is alert and oriented to person, place, and time.  Psychiatric:        Mood and Affect: Mood normal.        Behavior: Behavior normal.        Thought Content: Thought content normal.        Judgment: Judgment normal.     BP 101/63   Pulse 98   Temp 98.1 F (36.7 C) (Oral)   SpO2 99%  Wt Readings from Last 3 Encounters:  03/22/20 204 lb (92.5 kg)  01/11/20 207 lb (93.9 kg)  12/21/19 207 lb (93.9  kg)    There are no preventive care reminders to display for this patient.  There are no preventive care reminders to display for this patient.   Lab Results  Component Value Date   TSH 0.78 11/25/2017   Lab Results  Component Value Date   WBC 5.5 12/21/2019   HGB 13.2 12/21/2019   HCT 40.5 12/21/2019   MCV 97.4 12/21/2019   PLT 268 12/21/2019   Lab Results  Component Value Date   NA 142 12/21/2019   K 4.3 12/21/2019   CO2 32 12/21/2019   GLUCOSE 105 (H) 12/21/2019   BUN 11 12/21/2019   CREATININE 0.68 12/21/2019   BILITOT 0.4 12/21/2019   ALKPHOS 90 10/14/2016   AST 14 12/21/2019   ALT 10 12/21/2019   PROT 7.1 12/21/2019   ALBUMIN 3.7 10/14/2016   CALCIUM 9.6 12/21/2019   Lab Results  Component Value Date   CHOL 172 12/21/2019   Lab Results  Component Value Date   HDL 53 12/21/2019   Lab Results  Component Value Date   LDLCALC 102 (H) 12/21/2019   Lab Results  Component Value Date   TRIG 83 12/21/2019   Lab Results  Component Value Date   CHOLHDL 3.2 12/21/2019   Lab Results  Component Value Date   HGBA1C 6.5 (A) 03/22/2020       Assessment & Plan:   1. Rectal bleeding 2. Weakness 3. Hypotension due to hypovolemia 4. Tachycardia 5. Hemoglobin low Reported dark red blood per rectum x3 since  last night. No rectal exam performed today due to weakness, hypotension, tachycardia, and anemia-at this time it was felt unsafe to attempt to get the patient from her wheelchair up to the examination table. Hemoglobin is 7.4% in the office today-last hemoglobin in August of this year 13.2% showing a significant decrease from her baseline.   She is not experiencing any pain today.  She is significantly tachycardic with heart sounds auscultated at approximately 170 bpm.  Blood pressures in the office today originally 80/55.  Repeated blood pressure did come up to 101/63. She is maintaining oxygen saturations at 99% at this time. Given the symptomology and  significant decrease in hemoglobin recommend the patient report to the emergency room for further evaluation.  She is likely significantly hypovolemic and at this time the etiology and control of bleeding are unknown. Discussed plan to send patient to emergency room with PCP, who agrees with this plan.  Discussed with patient and her husband to report to the emergency room immediately for evaluation.  Have called [facility] emergency room and notified the charge nurse that the patient coming. Explained the significance of the decreased hemoglobin and symptoms that she is experiencing.  Patient and her husband expressed understanding and voiced that they will go to the emergency room right away. We will follow along with patient's care. - POCT hemoglobin  Follow-up based on recommendations from hospital evaluation or sooner if needed.     Tollie Eth, NP

## 2020-03-30 DIAGNOSIS — I10 Essential (primary) hypertension: Secondary | ICD-10-CM | POA: Diagnosis not present

## 2020-03-30 DIAGNOSIS — E876 Hypokalemia: Secondary | ICD-10-CM | POA: Diagnosis not present

## 2020-03-30 DIAGNOSIS — K922 Gastrointestinal hemorrhage, unspecified: Secondary | ICD-10-CM | POA: Diagnosis not present

## 2020-03-30 DIAGNOSIS — G4733 Obstructive sleep apnea (adult) (pediatric): Secondary | ICD-10-CM | POA: Diagnosis not present

## 2020-04-09 DIAGNOSIS — D62 Acute posthemorrhagic anemia: Secondary | ICD-10-CM | POA: Diagnosis not present

## 2020-04-09 DIAGNOSIS — K862 Cyst of pancreas: Secondary | ICD-10-CM | POA: Diagnosis not present

## 2020-04-20 ENCOUNTER — Telehealth: Payer: Self-pay

## 2020-04-20 DIAGNOSIS — D509 Iron deficiency anemia, unspecified: Secondary | ICD-10-CM | POA: Diagnosis not present

## 2020-04-20 NOTE — Telephone Encounter (Signed)
Shelby Thomas's daughter called to get an order for a new Rollator. She will need an appointment for the need for a new Rollator.   Patient scheduled.   [employer] fax [phone]

## 2020-04-23 ENCOUNTER — Encounter: Payer: Self-pay | Admitting: Family Medicine

## 2020-04-23 ENCOUNTER — Other Ambulatory Visit: Payer: Self-pay

## 2020-04-23 ENCOUNTER — Ambulatory Visit (INDEPENDENT_AMBULATORY_CARE_PROVIDER_SITE_OTHER): Payer: Medicare PPO | Admitting: Family Medicine

## 2020-04-23 VITALS — BP 136/52 | HR 86 | Ht 64.0 in | Wt 200.0 lb

## 2020-04-23 DIAGNOSIS — D500 Iron deficiency anemia secondary to blood loss (chronic): Secondary | ICD-10-CM

## 2020-04-23 DIAGNOSIS — K869 Disease of pancreas, unspecified: Secondary | ICD-10-CM | POA: Insufficient documentation

## 2020-04-23 DIAGNOSIS — R29898 Other symptoms and signs involving the musculoskeletal system: Secondary | ICD-10-CM | POA: Diagnosis not present

## 2020-04-23 MED ORDER — AMBULATORY NON FORMULARY MEDICATION: 0 refills | Status: DC

## 2020-04-23 NOTE — Telephone Encounter (Signed)
Hi Tonya, actually gave the patient the prescription.  But maybe we can fax it to [employer] at the number below and see if they will cover it that way.  Instead of him having to go to a DME supplier.

## 2020-04-23 NOTE — Progress Notes (Signed)
Established Patient Office Visit  Subjective:  Patient ID: Shelby Thomas, female    DOB: June 10, 1945  Age: 74 y.o. MRN: [id number]  CC:  Chief Complaint  Patient presents with  . Hospitalization Follow-up    HPI Shelby Thomas presents for hospital follow-up from recent GI bleed.  They discontinued her aspirin and her Lasix they also switched her PPI to Protonix instead.  She is scheduled for an iron infusion December 28.  He was admitted to [facility] on November 25.  She was discharged home on December 1 like this hospital stay was 7 days for GI bleed.  Also started on oral iron.  Conventional radiology was consulted and she underwent a mesenteric angiogram and had prophylactic embolization of the distal ileocecal branch.  Hemoglobin got as low as 7.3 on November 30 and she was transfused packed red blood cells.  Was also noted to have a lesion on the pancreatic tail on CT.  This is also noted on CT from November 27.  She is supposed to follow-up outpatient for repeat CT/MRI with EUS.  She did have a [facility]. They have scheduled her for EUS.    Past Medical History:  Diagnosis Date  . CHF (congestive heart failure) (HCC)    mild  . Diabetes mellitus (HCC)   . Foraminal stenosis of lumbar region    L4--L5  . GERD (gastroesophageal reflux disease)   . Heart disease, congenital    large ASD, Sees Dr. Elmarie Shiley  . Hiatal hernia   . Hyperlipidemia    dyslipidemia  . Hypertension    pulmonary/on nighttime O2  . 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 OSA (obstructive sleep apnea)   . Pneumonia 2006  . Scoliosis    severe  . Spine misalignment    T spine 80% to right, L-Spine 55% to left    Past Surgical History:  Procedure Laterality Date  . CARDIAC SURGERY     repair of ASD  . ESOPHAGEAL DILATION  04-09   dilation/Dr. Inge Rise  . REPLACEMENT TOTAL KNEE Left 12/08/13   Dr. Dossie Arbour  .  TOTAL HIP ARTHROPLASTY Left     Family History  Problem Relation Age of Onset  . Diabetes Mother   . Stroke Mother   . CAD Mother        CABG in her 59s    Social History   Socioeconomic History  . Marital status: Married    Spouse name: Not on file  . Number of children: 2  . Years of education: Not on file  . Highest education level: Not on file  Occupational History  . Not on file  Tobacco Use  . Smoking status: Never Smoker  . Smokeless tobacco: Never Used  Substance and Sexual Activity  . Alcohol use: No  . Drug use: Not on file  . 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   Other Topics Concern  . Not on file  Social History Narrative  . Not on file   Social Determinants of Health   Financial Resource Strain: Not on file  Food Insecurity: Not on file  Transportation Needs: Not on file  Physical Activity: Not on file  Stress: Not on file  Social Connections: Not on file  Intimate Partner Violence: Not on file  Outpatient Medications Prior to Visit  Medication Sig Dispense Refill  . AMBULATORY NON FORMULARY MEDICATION Medication Name: Free Style InsuLinx test strips.  Use daily as directed 50 strip 12  . AMBULATORY NON FORMULARY MEDICATION Medication Name: Oxygen with CPAP qhs at 2L/min. Diagnosis: Pulmonary Hypertension, Dyspnea on Exertion 1 Units prn  . ferrous sulfate 324 MG TBEC Take 1 tablet by mouth daily with breakfast.    . metformin (FORTAMET) 500 MG (OSM) 24 hr tablet Take 1 tablet (500 mg total) by mouth daily with breakfast. OK to sub generic 30 tablet 0  . Multiple Vitamin (MULTIVITAMIN) capsule Take 1 capsule by mouth daily.    . pantoprazole (PROTONIX) 40 MG tablet Take 40 mg by mouth daily.    . simvastatin (ZOCOR) 40 MG tablet TAKE ONE TABLET BY MOUTH DAILY 90 tablet 3  . aspirin 81 MG tablet Take 81 mg by mouth daily.      . furosemide (LASIX) 40  MG tablet TAKE ONE TABLET (40 MG TOTAL) BY MOUTH AS NEEDED 30 tablet 1  . omeprazole (PRILOSEC) 40 MG capsule TAKE ONE CAPSULE BY MOUTH DAILY 30 capsule 11   No facility-administered medications prior to visit.    Allergies  Allergen Reactions  . Ramipril     REACTION: cough  . Tramadol Other (See Comments)    Nausea    ROS Review of Systems    Objective:    Physical Exam  BP (!) 136/52   Pulse 86   Ht 5\' 4"  (1.626 m)   Wt 200 lb (90.7 kg)   SpO2 100%   BMI 34.33 kg/m  Wt Readings from Last 3 Encounters:  04/23/20 200 lb (90.7 kg)  03/22/20 204 lb (92.5 kg)  01/11/20 207 lb (93.9 kg)     Health Maintenance Due  Topic Date Due  . OPHTHALMOLOGY EXAM  10/15/2018  . COVID-19 Vaccine (3 - Booster for Pfizer series) 01/27/2020    There are no preventive care reminders to display for this patient.  Lab Results  Component Value Date   TSH 0.78 11/25/2017   Lab Results  Component Value Date   WBC 5.5 12/21/2019   HGB 7.4 (A) 03/28/2020   HCT 40.5 12/21/2019   MCV 97.4 12/21/2019   PLT 268 12/21/2019   Lab Results  Component Value Date   NA 142 12/21/2019   K 4.3 12/21/2019   CO2 32 12/21/2019   GLUCOSE 105 (H) 12/21/2019   BUN 11 12/21/2019   CREATININE 0.68 12/21/2019   BILITOT 0.4 12/21/2019   ALKPHOS 90 10/14/2016   AST 14 12/21/2019   ALT 10 12/21/2019   PROT 7.1 12/21/2019   ALBUMIN 3.7 10/14/2016   CALCIUM 9.6 12/21/2019   Lab Results  Component Value Date   CHOL 172 12/21/2019   Lab Results  Component Value Date   HDL 53 12/21/2019   Lab Results  Component Value Date   LDLCALC 102 (H) 12/21/2019   Lab Results  Component Value Date   TRIG 83 12/21/2019   Lab Results  Component Value Date   CHOLHDL 3.2 12/21/2019   Lab Results  Component Value Date   HGBA1C 6.5 (A) 03/22/2020      Assessment & Plan:   Problem List Items Addressed This Visit      Digestive   Pancreatic lesion - Primary    Further evaluation with  gastroenterology pending.  She has upcoming appointment.        Nervous and Auditory   Weakness  of both lower extremities    We will get her prescription for rolling walker.  Discussed local DME suppliers.  Or we can always try to get it through one of the home health companies.  I think she may be actively engaged with home health.      Relevant Medications   AMBULATORY NON FORMULARY MEDICATION     Other   Iron deficiency anemia due to chronic blood loss    Continue oral iron supplement.  Hopefully labs look great after last iron infusion.      Relevant Medications   ferrous sulfate 324 MG TBEC      Meds ordered this encounter  Medications  . AMBULATORY NON FORMULARY MEDICATION    Sig: Medication Name: Rollator. Dx: Lower extremity weakness    Dispense:  1 each    Refill:  0    Follow-up: Return in about 3 months (around 07/22/2020) for Diabetes follow-up.    Nani Gasser, MD

## 2020-04-24 ENCOUNTER — Encounter: Payer: Self-pay | Admitting: Family Medicine

## 2020-04-24 NOTE — Assessment & Plan Note (Addendum)
We will get her prescription for rolling walker.  Discussed local DME suppliers.  Or we can always try to get it through one of the home health companies.  I think she may be actively engaged with home health.

## 2020-04-24 NOTE — Assessment & Plan Note (Signed)
Continue oral iron supplement.  Hopefully labs look great after last iron infusion.

## 2020-04-24 NOTE — Assessment & Plan Note (Signed)
Further evaluation with gastroenterology pending.  She has upcoming appointment.

## 2020-04-25 ENCOUNTER — Other Ambulatory Visit: Payer: Self-pay | Admitting: Family Medicine

## 2020-04-26 ENCOUNTER — Other Ambulatory Visit: Payer: Self-pay | Admitting: *Deleted

## 2020-04-26 DIAGNOSIS — R29898 Other symptoms and signs involving the musculoskeletal system: Secondary | ICD-10-CM

## 2020-04-26 MED ORDER — AMBULATORY NON FORMULARY MEDICATION: 0 refills | Status: DC

## 2020-04-26 NOTE — Progress Notes (Signed)
Order faxed.

## 2020-05-01 DIAGNOSIS — D509 Iron deficiency anemia, unspecified: Secondary | ICD-10-CM | POA: Diagnosis not present

## 2020-05-08 DIAGNOSIS — E785 Hyperlipidemia, unspecified: Secondary | ICD-10-CM | POA: Diagnosis not present

## 2020-05-08 DIAGNOSIS — G4733 Obstructive sleep apnea (adult) (pediatric): Secondary | ICD-10-CM | POA: Diagnosis not present

## 2020-05-08 DIAGNOSIS — Z6834 Body mass index (BMI) 34.0-34.9, adult: Secondary | ICD-10-CM | POA: Diagnosis not present

## 2020-05-08 DIAGNOSIS — M5416 Radiculopathy, lumbar region: Secondary | ICD-10-CM | POA: Diagnosis not present

## 2020-05-08 DIAGNOSIS — Z8774 Personal history of (corrected) congenital malformations of heart and circulatory system: Secondary | ICD-10-CM | POA: Diagnosis not present

## 2020-05-08 DIAGNOSIS — E669 Obesity, unspecified: Secondary | ICD-10-CM | POA: Diagnosis not present

## 2020-05-08 DIAGNOSIS — E119 Type 2 diabetes mellitus without complications: Secondary | ICD-10-CM | POA: Diagnosis not present

## 2020-05-08 DIAGNOSIS — K219 Gastro-esophageal reflux disease without esophagitis: Secondary | ICD-10-CM | POA: Diagnosis not present

## 2020-05-08 DIAGNOSIS — K449 Diaphragmatic hernia without obstruction or gangrene: Secondary | ICD-10-CM | POA: Diagnosis not present

## 2020-05-08 DIAGNOSIS — D62 Acute posthemorrhagic anemia: Secondary | ICD-10-CM | POA: Diagnosis not present

## 2020-05-08 DIAGNOSIS — E1151 Type 2 diabetes mellitus with diabetic peripheral angiopathy without gangrene: Secondary | ICD-10-CM | POA: Diagnosis not present

## 2020-05-08 DIAGNOSIS — I10 Essential (primary) hypertension: Secondary | ICD-10-CM | POA: Diagnosis not present

## 2020-05-08 DIAGNOSIS — K862 Cyst of pancreas: Secondary | ICD-10-CM | POA: Diagnosis not present

## 2020-05-11 ENCOUNTER — Other Ambulatory Visit: Payer: Self-pay | Admitting: Family Medicine

## 2020-05-14 ENCOUNTER — Telehealth: Payer: Self-pay | Admitting: Family Medicine

## 2020-05-14 MED ORDER — PANTOPRAZOLE SODIUM 40 MG PO TBEC: 40.0000 mg | DELAYED_RELEASE_TABLET | Freq: Every day | ORAL | 3 refills | Status: DC

## 2020-05-14 NOTE — Telephone Encounter (Signed)
Patient called into the office needing prescription called into [facility] for the below medication.  Patient states this is what they put her on when she was in the hospital.    pantoprazole (PROTONIX) 40 MG tablet [[id number]]   Order Details Dose: 40 mg Route: Oral Frequency: Daily  Dispense Quantity: -- Refills: --       Sig: Take 40 mg by mouth daily.      Start Date: 04/04/20 End Date: --  Written Date: -- Expiration Date: --  Ordering Date: 04/23/20

## 2020-05-14 NOTE — Telephone Encounter (Signed)
Med sent.

## 2020-05-28 DIAGNOSIS — D509 Iron deficiency anemia, unspecified: Secondary | ICD-10-CM | POA: Diagnosis not present

## 2020-07-17 ENCOUNTER — Encounter: Payer: Self-pay | Admitting: Family Medicine

## 2020-07-20 ENCOUNTER — Other Ambulatory Visit: Payer: Self-pay

## 2020-07-20 ENCOUNTER — Encounter: Payer: Self-pay | Admitting: Family Medicine

## 2020-07-20 ENCOUNTER — Ambulatory Visit (INDEPENDENT_AMBULATORY_CARE_PROVIDER_SITE_OTHER): Payer: Medicare HMO | Admitting: Family Medicine

## 2020-07-20 VITALS — BP 127/40 | HR 83 | Ht 64.0 in | Wt 196.0 lb

## 2020-07-20 DIAGNOSIS — I10 Essential (primary) hypertension: Secondary | ICD-10-CM

## 2020-07-20 DIAGNOSIS — I110 Hypertensive heart disease with heart failure: Secondary | ICD-10-CM | POA: Diagnosis not present

## 2020-07-20 DIAGNOSIS — I509 Heart failure, unspecified: Secondary | ICD-10-CM | POA: Diagnosis not present

## 2020-07-20 DIAGNOSIS — E1169 Type 2 diabetes mellitus with other specified complication: Secondary | ICD-10-CM

## 2020-07-20 DIAGNOSIS — M160 Bilateral primary osteoarthritis of hip: Secondary | ICD-10-CM

## 2020-07-20 DIAGNOSIS — D539 Nutritional anemia, unspecified: Secondary | ICD-10-CM

## 2020-07-20 DIAGNOSIS — E669 Obesity, unspecified: Secondary | ICD-10-CM

## 2020-07-20 LAB — POCT GLYCOSYLATED HEMOGLOBIN (HGB A1C): Hemoglobin A1C: 6.8 % — AB (ref 4.0–5.6)

## 2020-07-20 MED ORDER — DICLOFENAC SODIUM 1 % EX GEL: 2.0000 g | Freq: Four times a day (QID) | CUTANEOUS | 1 refills | Status: DC

## 2020-07-20 MED ORDER — METFORMIN HCL ER 500 MG PO TB24: 500.0000 mg | ORAL_TABLET | Freq: Every day | ORAL | 1 refills | Status: DC

## 2020-07-20 NOTE — Progress Notes (Signed)
Established Patient Office Visit  Subjective:  Patient ID: Shelby Thomas, female    DOB: [birth year]  Age: 75 y.o. MRN: [id number]  CC:  Chief Complaint  Patient presents with  . Hypertension  . Diabetes    HPI Shelby Thomas presents for   Hypertension- Pt denies chest pain, SOB, dizziness, or heart palpitations.  Taking meds as directed w/o problems.  Denies medication side effects.    Diabetes - no hypoglycemic events. No wounds or sores that are not healing well. No increased thirst or urination. Checking glucose at home. Taking medications as prescribed without any side effects.  Due for foot exam and urine microalbumin today.  Iron deficiency-she still taking her iron supplement.  She feels like her arthritis has been flaring's particularly in her neck, shoulders, right knee and right hip.  She mainly relies on Tylenol for pain control and says lately has not been helping.  She said she goes to bed with pain and wakes up with pain she says she does get a little bit of improvement with a heating pad which she uses occasionally.   Past Medical History:  Diagnosis Date  . CHF (congestive heart failure) (HCC)    mild  . Diabetes mellitus (HCC)   . Foraminal stenosis of lumbar region    L4--L5  . GERD (gastroesophageal reflux disease)   . Heart disease, congenital    large ASD, Sees Dr. Elmarie Shiley  . Hiatal hernia   . Hyperlipidemia    dyslipidemia  . Hypertension    pulmonary/on nighttime O2  . 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 OSA (obstructive sleep apnea)   . Pneumonia 2006  . Scoliosis    severe  . Spine misalignment    T spine 80% to right, L-Spine 55% to left    Past Surgical History:  Procedure Laterality Date  . CARDIAC SURGERY     repair of ASD  . ESOPHAGEAL DILATION  04-09   dilation/Dr. Inge Rise  . REPLACEMENT TOTAL KNEE Left 12/08/13   Dr. Dossie Arbour  . TOTAL HIP ARTHROPLASTY Left     Family History  Problem Relation Age  of Onset  . Diabetes Mother   . Stroke Mother   . CAD Mother        CABG in her 13s    Social History   Socioeconomic History  . Marital status: Married    Spouse name: Not on file  . Number of children: 2  . Years of education: Not on file  . Highest education level: Not on file  Occupational History  . Not on file  Tobacco Use  . Smoking status: Never Smoker  . Smokeless tobacco: Never Used  Substance and Sexual Activity  . Alcohol use: No  . Drug use: Not on file  . 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   Other Topics Concern  . Not on file  Social History Narrative  . Not on file   Social Determinants of Health   Financial Resource Strain: Not on file  Food Insecurity: Not on file  Transportation Needs: Not on file  Physical Activity: Not on file  Stress: Not on file  Social Connections: Not on file  Intimate Partner Violence: Not on file    Outpatient Medications Prior to Visit  Medication Sig Dispense Refill  . AMBULATORY NON FORMULARY MEDICATION Medication Name: Free  Style InsuLinx test strips.  Use daily as directed 50 strip 12  . AMBULATORY NON FORMULARY MEDICATION Medication Name: Oxygen with CPAP qhs at 2L/min. Diagnosis: Pulmonary Hypertension, Dyspnea on Exertion 1 Units prn  . ferrous sulfate 324 MG TBEC Take 1 tablet by mouth daily with breakfast.    . Multiple Vitamin (MULTIVITAMIN) capsule Take 1 capsule by mouth daily.    . pantoprazole (PROTONIX) 40 MG tablet Take 1 tablet (40 mg total) by mouth daily. 30 tablet 3  . simvastatin (ZOCOR) 40 MG tablet TAKE ONE TABLET BY MOUTH DAILY 90 tablet 3  . AMBULATORY NON FORMULARY MEDICATION Medication Name: Rollator. Dx: Lower extremity weakness 1 each 0  . metformin (FORTAMET) 500 MG (OSM) 24 hr tablet Take 1 tablet (500 mg total) by mouth daily with breakfast. OK to sub generic 30 tablet 0  . metFORMIN  (GLUCOPHAGE-XR) 500 MG 24 hr tablet TAKE ONE TABLET (500MG  TOTAL) BY MOUTH DAILY WITH BREAKFAST 90 tablet 0   No facility-administered medications prior to visit.    Allergies  Allergen Reactions  . Ramipril     REACTION: cough  . Tramadol Other (See Comments)    Nausea    ROS Review of Systems    Objective:    Physical Exam Constitutional:      Appearance: She is well-developed.  HENT:     Head: Normocephalic and atraumatic.  Cardiovascular:     Rate and Rhythm: Normal rate and regular rhythm.     Heart sounds: Normal heart sounds.  Pulmonary:     Effort: Pulmonary effort is normal.     Breath sounds: Normal breath sounds.  Skin:    General: Skin is warm and dry.  Neurological:     Mental Status: She is alert and oriented to person, place, and time.  Psychiatric:        Behavior: Behavior normal.     BP (!) 127/40   Pulse 83   Ht 5\' 4"  (1.626 m)   Wt 196 lb (88.9 kg)   SpO2 98%   BMI 33.64 kg/m  Wt Readings from Last 3 Encounters:  07/20/20 196 lb (88.9 kg)  04/23/20 200 lb (90.7 kg)  03/22/20 204 lb (92.5 kg)     Health Maintenance Due  Topic Date Due  . MAMMOGRAM  10/23/2017  . COVID-19 Vaccine (3 - Booster for Pfizer series) 01/27/2020  . URINE MICROALBUMIN  09/19/2020    There are no preventive care reminders to display for this patient.  Lab Results  Component Value Date   TSH 0.78 11/25/2017   Lab Results  Component Value Date   WBC 5.5 12/21/2019   HGB 7.4 (A) 03/28/2020   HCT 40.5 12/21/2019   MCV 97.4 12/21/2019   PLT 268 12/21/2019   Lab Results  Component Value Date   NA 142 12/21/2019   K 4.3 12/21/2019   CO2 32 12/21/2019   GLUCOSE 105 (H) 12/21/2019   BUN 11 12/21/2019   CREATININE 0.68 12/21/2019   BILITOT 0.4 12/21/2019   ALKPHOS 90 10/14/2016   AST 14 12/21/2019   ALT 10 12/21/2019   PROT 7.1 12/21/2019   ALBUMIN 3.7 10/14/2016   CALCIUM 9.6 12/21/2019   Lab Results  Component Value Date   CHOL 172  12/21/2019   Lab Results  Component Value Date   HDL 53 12/21/2019   Lab Results  Component Value Date   LDLCALC 102 (H) 12/21/2019   Lab Results  Component Value Date   TRIG  83 12/21/2019   Lab Results  Component Value Date   CHOLHDL 3.2 12/21/2019   Lab Results  Component Value Date   HGBA1C 6.8 (A) 07/20/2020      Assessment & Plan:   Problem List Items Addressed This Visit      Cardiovascular and Mediastinum   HYPERTENSION, BENIGN ESSENTIAL - Primary    Well controlled. Continue current regimen. Follow up in  6 mo. diastolics a little bit low today but she says she has not had anything to eat or drink all day today.      Relevant Orders   BASIC METABOLIC PANEL WITH GFR     Endocrine   Diabetes mellitus type 2 in obese (HCC)    A1c is up just slightly to 6.8.  Was previously 6.5.  Just encouraged her to continue to cut back on sweets and carbs.  Follow-up in 4 months.      Relevant Medications   metFORMIN (GLUCOPHAGE-XR) 500 MG 24 hr tablet   Other Relevant Orders   BASIC METABOLIC PANEL WITH GFR   POCT glycosylated hemoglobin (Hb A1C) (Completed)     Other   Morbid obesity (HCC)   Relevant Medications   metFORMIN (GLUCOPHAGE-XR) 500 MG 24 hr tablet    Other Visit Diagnoses    Macrocytic anemia       Relevant Orders   Fe+TIBC+Fer   Primary osteoarthritis of both hips       Relevant Medications   diclofenac Sodium (VOLTAREN) 1 % GEL     Osteoarthritis affecting multiple joints-recommend trial of Voltaren gel did warn about potential side effects.  But I think it is worth trying up to 4 joints a day up to 4 times a day for the next month to see if she feels like it is helpful or not continue to use heat and gentle stretches.  Meds ordered this encounter  Medications  . metFORMIN (GLUCOPHAGE-XR) 500 MG 24 hr tablet    Sig: Take 1 tablet (500 mg total) by mouth daily with breakfast.    Dispense:  90 tablet    Refill:  1  . diclofenac Sodium  (VOLTAREN) 1 % GEL    Sig: Apply 2 g topically 4 (four) times daily. Up to 4 joints    Dispense:  350 g    Refill:  1    Follow-up: Return in about 4 months (around 11/19/2020) for Diabetes follow-up.    Nani Gasser, MD

## 2020-07-20 NOTE — Assessment & Plan Note (Signed)
Well controlled. Continue current regimen. Follow up in  6 mo. diastolics a little bit low today but she says she has not had anything to eat or drink all day today.

## 2020-07-20 NOTE — Assessment & Plan Note (Signed)
A1c is up just slightly to 6.8.  Was previously 6.5.  Just encouraged her to continue to cut back on sweets and carbs.  Follow-up in 4 months.

## 2020-07-21 LAB — IRON,TIBC AND FERRITIN PANEL
%SAT: 32 % (calc) (ref 16–45)
Ferritin: 386 ng/mL — ABNORMAL HIGH (ref 16–288)
Iron: 84 ug/dL (ref 45–160)
TIBC: 266 mcg/dL (calc) (ref 250–450)

## 2020-07-21 LAB — BASIC METABOLIC PANEL WITH GFR
BUN: 9 mg/dL (ref 7–25)
CO2: 33 mmol/L — ABNORMAL HIGH (ref 20–32)
Calcium: 9.8 mg/dL (ref 8.6–10.4)
Chloride: 104 mmol/L (ref 98–110)
Creat: 0.6 mg/dL (ref 0.60–0.93)
GFR, Est African American: 105 mL/min/{1.73_m2} (ref >=60)
GFR, Est Non African American: 90 mL/min/{1.73_m2} (ref >=60)
Glucose, Bld: 103 mg/dL — ABNORMAL HIGH (ref 65–99)
Potassium: 4.3 mmol/L (ref 3.5–5.3)
Sodium: 144 mmol/L (ref 135–146)

## 2020-07-23 ENCOUNTER — Ambulatory Visit (INDEPENDENT_AMBULATORY_CARE_PROVIDER_SITE_OTHER): Payer: Medicare HMO | Admitting: Family Medicine

## 2020-07-23 DIAGNOSIS — Z13820 Encounter for screening for osteoporosis: Secondary | ICD-10-CM | POA: Diagnosis not present

## 2020-07-23 DIAGNOSIS — Z1231 Encounter for screening mammogram for malignant neoplasm of breast: Secondary | ICD-10-CM | POA: Diagnosis not present

## 2020-07-23 DIAGNOSIS — Z780 Asymptomatic menopausal state: Secondary | ICD-10-CM

## 2020-07-23 DIAGNOSIS — Z0000 Encounter for general adult medical examination without abnormal findings: Secondary | ICD-10-CM

## 2020-07-23 NOTE — Progress Notes (Signed)
MEDICARE ANNUAL WELLNESS VISIT  07/23/2020  Telephone Visit Disclaimer This Medicare AWV was conducted by telephone due to national recommendations for restrictions regarding the COVID-19 Pandemic (e.g. social distancing).  I verified, using two identifiers, that I am speaking with Shelby Thomas or their authorized healthcare agent. I discussed the limitations, risks, security, and privacy concerns of performing an evaluation and management service by telephone and the potential availability of an in-person appointment in the future. The patient expressed understanding and agreed to proceed.  Location of Patient: Home Location of Provider (nurse):  In the office.  Subjective:    Shelby Thomas is a 75 y.o. female patient of Metheney, Barbarann Ehlers, MD who had a Medicare Annual Wellness Visit today via telephone. Shelby Thomas is Retired and lives with their spouse. she has 2 adopted children. she reports that she is socially active and does interact with friends/family regularly. she is minimally physically active and enjoys playing on her tablet and doing crossword puzzles.  Patient Care Team: Agapito Games, MD as PCP - General (Family Medicine) Early, Sung Amabile, NP as Nurse Practitioner (Nurse Practitioner)  Advanced Directives 07/23/2020 03/02/2014 07/29/2013  Does Patient Have a Medical Advance Directive? No No Patient does not have advance directive;Patient would like information  Would patient like information on creating a medical advance directive? No - Patient declined No - patient declined information Advance directive packet given    Hospital Utilization Over the Past 12 Months: # of hospitalizations or ER visits: 3 # of surgeries: 2  Review of Systems    Patient reports that her overall health is unchanged compared to last year.  History obtained from chart review and the patient  Patient Reported Readings (BP, Pulse, CBG, Weight, etc) none  Pain Assessment Pain :  No/denies pain     Current Medications & Allergies (verified) Allergies as of 07/23/2020      Reactions   Ramipril    REACTION: cough   Tramadol Other (See Comments)   Nausea      Medication List       Accurate as of July 23, 2020 11:25 AM. If you have any questions, ask your nurse or doctor.        AMBULATORY NON FORMULARY MEDICATION Medication Name: Free Style InsuLinx test strips.  Use daily as directed   AMBULATORY NON FORMULARY MEDICATION Medication Name: Oxygen with CPAP qhs at 2L/min. Diagnosis: Pulmonary Hypertension, Dyspnea on Exertion   diclofenac Sodium 1 % Gel Commonly known as: Voltaren Apply 2 g topically 4 (four) times daily. Up to 4 joints   ferrous sulfate 324 MG Tbec Take 1 tablet by mouth daily with breakfast.   metFORMIN 500 MG 24 hr tablet Commonly known as: GLUCOPHAGE-XR Take 1 tablet (500 mg total) by mouth daily with breakfast.   multivitamin capsule Take 1 capsule by mouth daily.   pantoprazole 40 MG tablet Commonly known as: PROTONIX Take 1 tablet (40 mg total) by mouth daily.   simvastatin 40 MG tablet Commonly known as: ZOCOR TAKE ONE TABLET BY MOUTH DAILY       History (reviewed): Past Medical History:  Diagnosis Date  . CHF (congestive heart failure) (HCC)    mild  . Diabetes mellitus (HCC)   . Foraminal stenosis of lumbar region    L4--L5  . GERD (gastroesophageal reflux disease)   . Heart disease, congenital    large ASD, Sees Dr. Elmarie Shiley  . Hiatal hernia   . Hyperlipidemia    dyslipidemia  .  Hypertension    pulmonary/on nighttime O2  . 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 OSA (obstructive sleep apnea)   . Pneumonia 2006  . Scoliosis    severe  . Spine misalignment    T spine 80% to right, L-Spine 55% to left   Past Surgical History:  Procedure Laterality Date  . CARDIAC SURGERY     repair of ASD  . ESOPHAGEAL DILATION  04-09   dilation/Dr. Inge Rise  . REPLACEMENT TOTAL KNEE Left 12/08/13    Dr. Dossie Arbour  . TOTAL HIP ARTHROPLASTY Left    Family History  Problem Relation Age of Onset  . Diabetes Mother   . Stroke Mother   . CAD Mother        CABG in her 63s   Social History   Socioeconomic History  . Marital status: Married    Spouse name: Shelby Thomas  . Number of children: 2  . Years of education: 14.5  . Highest education level: Some college, no degree  Occupational History    Comment: Retired.  Tobacco Use  . Smoking status: Never Smoker  . Smokeless tobacco: Never Used  Vaping Use  . Vaping Use: Never used  Substance and Sexual Activity  . Alcohol use: No  . Drug use: Never  . Sexual activity: Not on file  Other Topics Concern  . Not on file  Social History Narrative   Lives at home with Shelby Thomas. She is retired. She works on her tablet and does puzzles.   Social Determinants of Health   Financial Resource Strain: Low Risk   . Difficulty of Paying Living Expenses: Not hard at all  Food Insecurity: No Food Insecurity  . Worried About Programme researcher, broadcasting/film/video in the Last Year: Never true  . Ran Out of Food in the Last Year: Never true  Transportation Needs: No Transportation Needs  . Lack of Transportation (Medical): No  . Lack of Transportation (Non-Medical): No  Physical Activity: Inactive  . Days of Exercise per Week: 0 days  . Minutes of Exercise per Session: 0 min  Stress: No Stress Concern Present  . Feeling of Stress : Not at all  Social Connections: Moderately Integrated  . Frequency of Communication with Friends and Family: More than three times a week  . Frequency of Social Gatherings with Friends and Family: Once a week  . Attends Religious Services: More than 4 times per year  . Active Member of Clubs or Organizations: No  . Attends Banker Meetings: Never  . Marital Status: Married    Activities of Daily Living In your present state of health, do you have any difficulty performing the following activities: 07/23/2020   Hearing? Y  Comment has noticed some hearing loss in both ears.  Vision? N  Difficulty concentrating or making decisions? N  Walking or climbing stairs? Y  Comment Knee replacement (Left) that didn't go well and she also has some arthritis  Dressing or bathing? N  Doing errands, shopping? Y  Comment Her husband assists her with going to do her errands.  Preparing Food and eating ? N  Using the Toilet? N  In the past six months, have you accidently leaked urine? N  Do you have problems with loss of bowel control? N  Managing your Medications? N  Managing your Finances? N  Housekeeping or managing your Housekeeping? N  Some recent data might be hidden    Patient Education/ Literacy How often do  you need to have someone help you when you read instructions, pamphlets, or other written materials from your doctor or pharmacy?: 1 - Never What is the last grade level you completed in school?: High school and 2.5 years of college.  Exercise Current Exercise Habits: The patient does not participate in regular exercise at present, Exercise limited by: None identified  Diet Patient reports consuming 2-2.5 meals a day and 2 snack(s) a day Patient reports that her primary diet is: Regular Patient reports that she does have regular access to food.   Depression Screen PHQ 2/9 Scores 07/23/2020 12/21/2019 12/27/2018 08/26/2018 05/21/2018 11/25/2017 11/25/2017  PHQ - 2 Score 0 6 0 0 0 0 0  PHQ- 9 Score - 27 - - - 0 -     Fall Risk Fall Risk  07/23/2020 03/22/2020 12/27/2018 08/26/2018 05/21/2018  Falls in the past year? 0 0 0 0 0  Number falls in past yr: 0 - 0 0 0  Injury with Fall? 0 - 0 0 -  Risk for fall due to : No Fall Risks Impaired mobility - Impaired balance/gait;Impaired mobility Impaired mobility  Follow up Falls evaluation completed Falls prevention discussed - - Falls prevention discussed     Objective:  Shelby Peteroris A Severns seemed alert and oriented and she participated appropriately  during our telephone visit.  Blood Pressure Weight BMI  BP Readings from Last 3 Encounters:  07/20/20 (!) 127/40  04/23/20 (!) 136/52  03/28/20 101/63   Wt Readings from Last 3 Encounters:  07/20/20 196 lb (88.9 kg)  04/23/20 200 lb (90.7 kg)  03/22/20 204 lb (92.5 kg)   BMI Readings from Last 1 Encounters:  07/20/20 33.64 kg/m    *Unable to obtain current vital signs, weight, and BMI due to telephone visit type  Hearing/Vision  . Tenzin did not seem to have difficulty with hearing/understanding during the telephone conversation . Reports that she has had a formal eye exam by an eye care professional within the past year . Reports that she has not had a formal hearing evaluation within the past year *Unable to fully assess hearing and vision during telephone visit type  Cognitive Function: 6CIT Screen 07/23/2020  What Year? 0 points  What month? 0 points  What time? 0 points  Count back from 20 2 points  Months in reverse 0 points  Repeat phrase 2 points  Total Score 4   (Normal:0-7, Significant for Dysfunction: >8)  Normal Cognitive Function Screening: Yes   Immunization & Health Maintenance Record Immunization History  Administered Date(s) Administered  . Fluad Quad(high Dose 65+) 12/27/2018, 01/11/2020  . Influenza Whole 04/02/2006, 04/19/2007  . Influenza, High Dose Seasonal PF 12/24/2016, 02/18/2018  . Influenza, Seasonal, Injecte, Preservative Fre 03/31/2012  . Influenza,inj,Quad PF,6+ Mos 02/28/2013, 03/02/2014, 03/20/2015, 12/24/2015  . PFIZER(Purple Top)SARS-COV-2 Vaccination 07/06/2019, 07/27/2019  . Pneumococcal Conjugate-13 11/17/2014  . Pneumococcal Polysaccharide-23 04/02/2006, 07/01/2012  . Tdap 10/29/2010  . Zoster 12/23/2010    Health Maintenance  Topic Date Due  . COVID-19 Vaccine (3 - Booster for Pfizer series) 08/08/2020 (Originally 01/27/2020)  . URINE MICROALBUMIN  11/24/2020 (Originally 09/19/2020)  . MAMMOGRAM  07/23/2021 (Originally  10/23/2017)  . TETANUS/TDAP  10/28/2020  . OPHTHALMOLOGY EXAM  12/27/2020  . HEMOGLOBIN A1C  01/20/2021  . FOOT EXAM  07/20/2021  . COLONOSCOPY (Pts [id number] Insurance coverage will need to be confirmed)  02/17/2024  . INFLUENZA VACCINE  Completed  . DEXA SCAN  Completed  . Hepatitis C Screening  Completed  .  PNA vac Low Risk Adult  Completed  . HPV VACCINES  Aged Out       Assessment  This is a routine wellness examination for Shelby Thomas.  Health Maintenance: Due or Overdue There are no preventive care reminders to display for this patient.  Shelby Thomas does not need a referral for [employer] Assistance: Care Management:   no Social Work:    no Prescription Assistance:  no Nutrition/Diabetes Education:  no   Plan:  Personalized Goals Goals Addressed              This Visit's Progress   .  Patient Stated (pt-stated)        07/23/2020 AWV Goal: Exercise for General Health   Patient will verbalize understanding of the benefits of increased physical activity:  Exercising regularly is important. It will improve your overall fitness, flexibility, and endurance.  Regular exercise also will improve your overall health. It can help you control your weight, reduce stress, and improve your bone density.  Over the next year, patient will increase physical activity as tolerated with a goal of at least 150 minutes of moderate physical activity per week.   You can tell that you are exercising at a moderate intensity if your heart starts beating faster and you start breathing faster but can still hold a conversation.  Moderate-intensity exercise ideas include:  Walking 1 mile (1.6 km) in about 15 minutes  Biking  Hiking  Golfing  Dancing  Water aerobics  Patient will verbalize understanding of everyday activities that increase physical activity by providing examples like the following: ? Yard work, such as: ? Pushing a Surveyor, mining ? Raking and bagging  leaves ? Washing your car ? Pushing a stroller ? Shoveling snow ? Gardening ? Washing windows or floors  Patient will be able to explain general safety guidelines for exercising:   Before you start a new exercise program, talk with your health care provider.  Do not exercise so much that you hurt yourself, feel dizzy, or get very short of breath.  Wear comfortable clothes and wear shoes with good support.  Drink plenty of water while you exercise to prevent dehydration or heat stroke.  Work out until your breathing and your heartbeat get faster.       Personalized Health Maintenance & Screening Recommendations  Screening mammography Bone densitometry screening  Tetanus shot will be due in June.   Lung Cancer Screening Recommended: no (Low Dose CT Chest recommended if Age 93-80 years, 30 pack-year currently smoking OR have quit w/in past 15 years) Hepatitis C Screening recommended: no HIV Screening recommended: no  Advanced Directives: Written information was not prepared per patient's request.  Referrals & Orders Orders Placed This Encounter  Procedures  . DEXAScan  . Mammogram 3D SCREEN BREAST BILATERAL    Follow-up Plan . Follow-up with Agapito Games, MD as planned . Schedule your Mammogram and Dexa scan. Referral has been sent and they will give you a call to schedule.  . At your next office visit, they will do the urine microalbumin, since you will be due in May for it.  . Tetanus shot will be due in June and that can be done at the pharmacy.  . Shingrix is due and that can also be done at the pharmacy.  . Medicare wellness in one year.   I have personally reviewed and noted the following in the patient's chart:   . Medical and social history . Use of  alcohol, tobacco or illicit drugs  . Current medications and supplements . Functional ability and status . Nutritional status . Physical activity . Advanced directives . List of other  physicians . Hospitalizations, surgeries, and ER visits in previous 12 months . Vitals . Screenings to include cognitive, depression, and falls . Referrals and appointments  In addition, I have reviewed and discussed with Shelby Thomas certain preventive protocols, quality metrics, and best practice recommendations. A written personalized care plan for preventive services as well as general preventive health recommendations is available and can be mailed to the patient at her request.      Modesto Charon, RN  07/23/2020

## 2020-07-23 NOTE — Patient Instructions (Addendum)
MEDICARE ANNUAL WELLNESS VISIT Health Maintenance Summary and Written Plan of Care  [name] ,  Thank you for allowing me to perform your Medicare Annual Wellness Visit and for your ongoing commitment to your health.   Health Maintenance & Immunization History Health Maintenance  Topic Date Due  . COVID-19 Vaccine (3 - Booster for Pfizer series) 08/08/2020 (Originally 01/27/2020)  . URINE MICROALBUMIN  11/24/2020 (Originally 09/19/2020)  . MAMMOGRAM  07/23/2021 (Originally 10/23/2017)  . TETANUS/TDAP  10/28/2020  . OPHTHALMOLOGY EXAM  12/27/2020  . HEMOGLOBIN A1C  01/20/2021  . FOOT EXAM  07/20/2021  . COLONOSCOPY (Pts 45-74yrs Insurance coverage will need to be confirmed)  02/17/2024  . INFLUENZA VACCINE  Completed  . DEXA SCAN  Completed  . Hepatitis C Screening  Completed  . PNA vac Low Risk Adult  Completed  . HPV VACCINES  Aged Out   Immunization History  Administered Date(s) Administered  . Fluad Quad(high Dose 65+) 12/27/2018, 01/11/2020  . Influenza Whole 04/02/2006, 04/19/2007  . Influenza, High Dose Seasonal PF 12/24/2016, 02/18/2018  . Influenza, Seasonal, Injecte, Preservative Fre 03/31/2012  . Influenza,inj,Quad PF,6+ Mos 02/28/2013, 03/02/2014, 03/20/2015, 12/24/2015  . PFIZER(Purple Top)SARS-COV-2 Vaccination 07/06/2019, 07/27/2019  . Pneumococcal Conjugate-13 11/17/2014  . Pneumococcal Polysaccharide-23 04/02/2006, 07/01/2012  . Tdap 10/29/2010  . Zoster 12/23/2010    These are the patient goals that we discussed: Goals Addressed              This Visit's Progress   .  Patient Stated (pt-stated)        07/23/2020 AWV Goal: Exercise for General Health   Patient will verbalize understanding of the benefits of increased physical activity:  Exercising regularly is important. It will improve your overall fitness, flexibility, and endurance.  Regular exercise also will improve your overall health. It can help you control your weight, reduce stress,  and improve your bone density.  Over the next year, patient will increase physical activity as tolerated with a goal of at least 150 minutes of moderate physical activity per week.   You can tell that you are exercising at a moderate intensity if your heart starts beating faster and you start breathing faster but can still hold a conversation.  Moderate-intensity exercise ideas include:  Walking 1 mile (1.6 km) in about 15 minutes  Biking  Hiking  Golfing  Dancing  Water aerobics  Patient will verbalize understanding of everyday activities that increase physical activity by providing examples like the following: ? Yard work, such as: ? Pushing a Surveyor, mining ? Raking and bagging leaves ? Washing your car ? Pushing a stroller ? Shoveling snow ? Gardening ? Washing windows or floors  Patient will be able to explain general safety guidelines for exercising:   Before you start a new exercise program, talk with your health care provider.  Do not exercise so much that you hurt yourself, feel dizzy, or get very short of breath.  Wear comfortable clothes and wear shoes with good support.  Drink plenty of water while you exercise to prevent dehydration or heat stroke.  Work out until your breathing and your heartbeat get faster.         This is a list of Health Maintenance Items that are overdue or due now: There are no preventive care reminders to display for this patient.   Orders/Referrals Placed Today: Orders Placed This Encounter  Procedures  . DEXAScan    Standing Status:   Future    Standing Expiration Date:  07/23/2021    Scheduling Instructions:     Please call patient to schedule. [phone]    Order Specific Question:   Reason for exam:    Answer:   Post Menopausal; screening for osteoporosis    Order Specific Question:   Preferred imaging location?    [facility]  . Mammogram 3D SCREEN BREAST BILATERAL    Standing Status:   Future     Standing Expiration Date:   07/23/2021    Scheduling Instructions:     Please call patient to schedule. [phone]    Order Specific Question:   Reason for Exam (SYMPTOM  OR DIAGNOSIS REQUIRED)    Answer:   Breast cancer screening    Order Specific Question:   Preferred imaging location?    Answer:   [facility]   (Contact our referral department at [phone] if you have not spoken with someone about your referral appointment within the next 5 days)    Follow-up Plan . Follow-up with Agapito Games, MD as planned . Schedule your Mammogram and Dexa scan. Referral has been sent and they will give you a call to schedule.  . At your next office visit, they will do the urine microalbumin, since you will be due in May for it.  . Tetanus shot will be due in June and that can be done at the pharmacy.  . Shingrix is due and that can also be done at the pharmacy.  . Medicare wellness in one year.    Bone Density Test A bone density test uses a type of X-ray to measure the amount of calcium and other minerals in a person's bones. It can measure bone density in the hip and the spine. The test is similar to having a regular X-ray. This test may also be called:  Bone densitometry.  Bone mineral density test.  Dual-energy X-ray absorptiometry (DEXA). You may have this test to:  Diagnose a condition that causes weak or thin bones (osteoporosis).  Screen you for osteoporosis.  Predict your risk for a broken bone (fracture).  Determine how well your osteoporosis treatment is working. Tell a health care provider about:  Any allergies you have.  All medicines you are taking, including vitamins, herbs, eye drops, creams, and over-the-counter medicines.  Any problems you or family members have had with anesthetic medicines.  Any blood disorders you have.  Any surgeries you have had.  Any medical conditions you have.  Whether you are pregnant or may be  pregnant.  Any medical tests you have had within the past 14 days that used contrast material. What are the risks? Generally, this is a safe test. However, it does expose you to a small amount of radiation, which can slightly increase your cancer risk. What happens before the test?  Do not take any calcium supplements within the 24 hours before your test.  You will need to remove all metal jewelry, eyeglasses, removable dental appliances, and any other metal objects on your body. What happens during the test?  You will lie down on an exam table. There will be an X-ray generator below you and an imaging device above you.  Other devices, such as boxes or braces, may be used to position your body properly for the scan.  The machine will slowly scan your body. You will need to keep very still while the machine does the scan.  The images will show up on a screen in the room. Images will be examined  by a specialist after your test is finished. The procedure may vary among health care providers and hospitals.   What can I expect after the test? It is up to you to get the results of your test. Ask your health care provider, or the department that is doing the test, when your results will be ready. Summary  A bone density test is an imaging test that uses a type of X-ray to measure the amount of calcium and other minerals in your bones.  The test may be used to diagnose or screen you for a condition that causes weak or thin bones (osteoporosis), predict your risk for a broken bone (fracture), or determine how well your osteoporosis treatment is working.  Do not take any calcium supplements within 24 hours before your test.  Ask your health care provider, or the department that is doing the test, when your results will be ready. This information is not intended to replace advice given to you by your health care provider. Make sure you discuss any questions you have with your health care  provider. Document Revised: 10/06/2019 Document Reviewed: 10/06/2019 Elsevier Patient Education  2021 Elsevier Inc.   Health Maintenance, Female Adopting a healthy lifestyle and getting preventive care are important in promoting health and wellness. Ask your health care provider about:  The right schedule for you to have regular tests and exams.  Things you can do on your own to prevent diseases and keep yourself healthy. What should I know about diet, weight, and exercise? Eat a healthy diet  Eat a diet that includes plenty of vegetables, fruits, low-fat dairy products, and lean protein.  Do not eat a lot of foods that are high in solid fats, added sugars, or sodium.   Maintain a healthy weight Body mass index (BMI) is used to identify weight problems. It estimates body fat based on height and weight. Your health care provider can help determine your BMI and help you achieve or maintain a healthy weight. Get regular exercise Get regular exercise. This is one of the most important things you can do for your health. Most adults should:  Exercise for at least 150 minutes each week. The exercise should increase your heart rate and make you sweat (moderate-intensity exercise).  Do strengthening exercises at least twice a week. This is in addition to the moderate-intensity exercise.  Spend less time sitting. Even light physical activity can be beneficial. Watch cholesterol and blood lipids Have your blood tested for lipids and cholesterol at 74 years of age, then have this test every 5 years. Have your cholesterol levels checked more often if:  Your lipid or cholesterol levels are high.  You are older than 74 years of age.  You are at high risk for heart disease. What should I know about cancer screening? Depending on your health history and family history, you may need to have cancer screening at various ages. This may include screening for:  Breast cancer.  Cervical  cancer.  Colorectal cancer.  Skin cancer.  Lung cancer. What should I know about heart disease, diabetes, and high blood pressure? Blood pressure and heart disease  High blood pressure causes heart disease and increases the risk of stroke. This is more likely to develop in people who have high blood pressure readings, are of African descent, or are overweight.  Have your blood pressure checked: ? Every 3-5 years if you are 73-11 years of age. ? Every year if you are 70 years old or  older. Diabetes Have regular diabetes screenings. This checks your fasting blood sugar level. Have the screening done:  Once every three years after age 51 if you are at a normal weight and have a low risk for diabetes.  More often and at a younger age if you are overweight or have a high risk for diabetes. What should I know about preventing infection? Hepatitis B If you have a higher risk for hepatitis B, you should be screened for this virus. Talk with your health care provider to find out if you are at risk for hepatitis B infection. Hepatitis C Testing is recommended for:  Everyone born from [birth year] through 1964.  Anyone with known risk factors for hepatitis C. Sexually transmitted infections (STIs)  Get screened for STIs, including gonorrhea and chlamydia, if: ? You are sexually active and are younger than 74 years of age. ? You are older than 74 years of age and your health care provider tells you that you are at risk for this type of infection. ? Your sexual activity has changed since you were last screened, and you are at increased risk for chlamydia or gonorrhea. Ask your health care provider if you are at risk.  Ask your health care provider about whether you are at high risk for HIV. Your health care provider may recommend a prescription medicine to help prevent HIV infection. If you choose to take medicine to prevent HIV, you should first get tested for HIV. You should then be tested every 3  months for as long as you are taking the medicine. Pregnancy  If you are about to stop having your period (premenopausal) and you may become pregnant, seek counseling before you get pregnant.  Take 400 to 800 micrograms (mcg) of folic acid every day if you become pregnant.  Ask for birth control (contraception) if you want to prevent pregnancy. Osteoporosis and menopause Osteoporosis is a disease in which the bones lose minerals and strength with aging. This can result in bone fractures. If you are 54 years old or older, or if you are at risk for osteoporosis and fractures, ask your health care provider if you should:  Be screened for bone loss.  Take a calcium or vitamin D supplement to lower your risk of fractures.  Be given hormone replacement therapy (HRT) to treat symptoms of menopause. Follow these instructions at home: Lifestyle  Do not use any products that contain nicotine or tobacco, such as cigarettes, e-cigarettes, and chewing tobacco. If you need help quitting, ask your health care provider.  Do not use street drugs.  Do not share needles.  Ask your health care provider for help if you need support or information about quitting drugs. Alcohol use  Do not drink alcohol if: ? Your health care provider tells you not to drink. ? You are pregnant, may be pregnant, or are planning to become pregnant.  If you drink alcohol: ? Limit how much you use to 0-1 drink a day. ? Limit intake if you are breastfeeding.  Be aware of how much alcohol is in your drink. In the [place] one drink equals one 12 oz bottle of beer (355 mL), one 5 oz glass of wine (148 mL), or one 1 oz glass of hard liquor (44 mL). General instructions  Schedule regular health, dental, and eye exams.  Stay current with your vaccines.  Tell your health care provider if: ? You often feel depressed. ? You have ever been abused or do not feel safe  at home. Summary  Adopting a healthy lifestyle and getting  preventive care are important in promoting health and wellness.  Follow your health care provider's instructions about healthy diet, exercising, and getting tested or screened for diseases.  Follow your health care provider's instructions on monitoring your cholesterol and blood pressure. This information is not intended to replace advice given to you by your health care provider. Make sure you discuss any questions you have with your health care provider. Document Revised: 04/14/2018 Document Reviewed: 04/14/2018 Elsevier Patient Education  2021 Elsevier Inc.   Hearing Loss Hearing loss is a partial or total loss of the ability to hear. This can be temporary or permanent, and it can happen in one or both ears. Medical care is necessary to treat hearing loss properly and to prevent the condition from getting worse. Your hearing may partially or completely come back, depending on what caused your hearing loss and how severe it is. In some cases, hearing loss is permanent. What are the causes? Common causes of hearing loss include:  Too much wax in the ear canal.  Infection of the ear canal or middle ear.  Fluid in the middle ear.  Injury to the ear or surrounding area.  An object stuck in the ear.  A history of prolonged exposure to loud sounds, such as music. Less common causes of hearing loss include:  Tumors in the ear.  Viral or bacterial infections, such as meningitis.  A hole in the eardrum (perforated eardrum).  Problems with the hearing nerve that sends signals between the brain and the ear.  Certain medicines. What are the signs or symptoms? Symptoms of this condition may include:  Difficulty telling the difference between sounds.  Difficulty following a conversation when there is background noise.  Lack of response to sounds in your environment. This may be most noticeable when you do not respond to startling sounds.  Needing to turn up the volume on the  television, radio, or other devices.  Ringing in the ears.  Dizziness. How is this diagnosed? This condition is diagnosed based on:  A physical exam.  A hearing test (audiometry). The audiometry test will be performed by a hearing specialist (audiologist). You may also be referred to an ear, nose, and throat (ENT) specialist (otolaryngologist).   How is this treated? Treatment for hearing loss may include:  Ear wax removal.  Medicines to treat or prevent infection (antibiotics).  Medicines to reduce inflammation (corticosteroids).  Hearing aids for hearing loss related to nerve damage. Follow these instructions at home:  If you were prescribed an antibiotic medicine, take it as told by your health care provider. Do not stop taking the antibiotic even if you start to feel better.  Take over-the-counter and prescription medicines only as told by your health care provider.  Avoid loud noises.  Return to your normal activities as told by your health care provider. Ask your health care provider what activities are safe for you.  Keep all follow-up visits as told by your health care provider. This is important. Contact a health care provider if:  You feel dizzy.  You develop new symptoms.  You vomit or feel nauseous.  You have a fever. Get help right away if:  You develop sudden changes in your vision.  You have severe ear pain.  You have new or increased weakness.  You have a severe headache. Summary  Hearing loss is a decreased ability to hear sounds around you. It can be temporary  or permanent.  Treatment will depend on the cause of your hearing loss. It may include ear wax removal, medicines, or a hearing aid.  Your hearing may partially or completely come back, depending on what caused your hearing loss and how severe it is.  Keep all follow-up visits as told by your health care provider. This is important. This information is not intended to replace advice  given to you by your health care provider. Make sure you discuss any questions you have with your health care provider. Document Revised: 01/19/2018 Document Reviewed: 01/19/2018 Elsevier Patient Education  2021 Elsevier Inc.   Mammogram A mammogram is a low energy X-ray of the breasts that is done to check for abnormal changes. This procedure can screen for and detect any changes that may indicate breast cancer. Mammograms are regularly done on women. A man may have a mammogram if he has a lump or swelling in his breast. A mammogram can also identify other changes and variations in the breast, such as:  Inflammation of the breast tissue (mastitis).  An infected area that contains a collection of pus (abscess).  A fluid-filled sac (cyst).  Fibrocystic changes. This is when breast tissue becomes denser, which can make the tissue feel rope-like or uneven under the skin.  Tumors that are not cancerous (benign). Tell a health care provider:  About any allergies you have.  If you have breast implants.  If you have had previous breast disease, biopsy, or surgery.  If you are breastfeeding.  If you are younger than age 34.  If you have a family history of breast cancer.  Whether you are pregnant or may be pregnant. What are the risks? Generally, this is a safe procedure. However, problems may occur, including:  Exposure to radiation. Radiation levels are very low with this test.  The results being misinterpreted.  The need for further tests.  The inability of the mammogram to detect certain cancers. What happens before the procedure?  Schedule your test about 1-2 weeks after your menstrual period if you are still menstruating. This is usually when your breasts are the least tender.  If you have had a mammogram done at a different facility in the past, get the mammogram X-rays or have them sent to your current exam facility. The new and old images will be compared.  Wash your  breasts and underarms on the day of the test.  Do not wear deodorants, perfumes, lotions, or powders anywhere on your body on the day of the test.  Remove any jewelry from your neck.  Wear clothes that you can change into and out of easily. What happens during the procedure?  You will undress from the waist up and put on a gown that opens in the front.  You will stand in front of the X-ray machine.  Each breast will be placed between two plastic or glass plates. The plates will compress your breast for a few seconds. Try to stay as relaxed as possible during the procedure. This does not cause any harm to your breasts and any discomfort you feel will be very brief.  X-rays will be taken from different angles of each breast. The procedure may vary among health care providers and hospitals.   What happens after the procedure?  The mammogram will be examined by a specialist (radiologist).  You may need to repeat certain parts of the test, depending on the quality of the images. This is commonly done if the radiologist needs a  better view of the breast tissue.  You may resume your normal activities.  It is up to you to get the results of your procedure. Ask your health care provider, or the department that is doing the procedure, when your results will be ready. Summary  A mammogram is a low energy X-ray of the breasts that is done to check for abnormal changes. A man may have a mammogram if he has a lump or swelling in his breast.  If you have had a mammogram done at a different facility in the past, get the mammogram X-rays or have them sent to your current exam facility in order to compare them.  Schedule your test about 1-2 weeks after your menstrual period if you are still menstruating.  For this test, each breast will be placed between two plastic or glass plates. The plates will compress your breast for a few seconds.  Ask when your test results will be ready. Make sure you get  your test results. This information is not intended to replace advice given to you by your health care provider. Make sure you discuss any questions you have with your health care provider. Document Revised: 12/10/2017 Document Reviewed: 12/10/2017 Elsevier Patient Education  2021 [employer]

## 2020-08-14 ENCOUNTER — Other Ambulatory Visit: Payer: Self-pay | Admitting: Family Medicine

## 2020-08-15 ENCOUNTER — Other Ambulatory Visit: Payer: Self-pay

## 2020-08-15 ENCOUNTER — Ambulatory Visit: Payer: Medicare HMO

## 2020-08-15 ENCOUNTER — Ambulatory Visit (INDEPENDENT_AMBULATORY_CARE_PROVIDER_SITE_OTHER): Payer: Medicare HMO

## 2020-08-15 DIAGNOSIS — Z0000 Encounter for general adult medical examination without abnormal findings: Secondary | ICD-10-CM

## 2020-08-15 DIAGNOSIS — Z1231 Encounter for screening mammogram for malignant neoplasm of breast: Secondary | ICD-10-CM

## 2020-08-15 DIAGNOSIS — Z780 Asymptomatic menopausal state: Secondary | ICD-10-CM

## 2020-08-15 DIAGNOSIS — Z13820 Encounter for screening for osteoporosis: Secondary | ICD-10-CM | POA: Diagnosis not present

## 2020-08-16 NOTE — Progress Notes (Signed)
Normal mammogram. Follow up in 1 year.

## 2020-10-02 ENCOUNTER — Telehealth: Payer: Self-pay | Admitting: *Deleted

## 2020-10-02 NOTE — Telephone Encounter (Signed)
Pt asked if she could come in today for back pain. She was advised that we didn't have any openings. She was scheduled for tomorrow with Tandy Gaw.

## 2020-10-03 ENCOUNTER — Ambulatory Visit (INDEPENDENT_AMBULATORY_CARE_PROVIDER_SITE_OTHER): Payer: Medicare HMO | Admitting: Physician Assistant

## 2020-10-03 ENCOUNTER — Ambulatory Visit (INDEPENDENT_AMBULATORY_CARE_PROVIDER_SITE_OTHER): Payer: Medicare HMO

## 2020-10-03 ENCOUNTER — Other Ambulatory Visit: Payer: Self-pay

## 2020-10-03 VITALS — BP 122/45 | HR 80 | Ht 64.0 in | Wt 198.0 lb

## 2020-10-03 DIAGNOSIS — M419 Scoliosis, unspecified: Secondary | ICD-10-CM

## 2020-10-03 DIAGNOSIS — M5136 Other intervertebral disc degeneration, lumbar region: Secondary | ICD-10-CM

## 2020-10-03 DIAGNOSIS — M545 Low back pain, unspecified: Secondary | ICD-10-CM

## 2020-10-03 MED ORDER — METHOCARBAMOL 500 MG PO TABS: 500.0000 mg | ORAL_TABLET | Freq: Three times a day (TID) | ORAL | 0 refills | Status: DC

## 2020-10-03 MED ORDER — PREDNISONE 50 MG PO TABS
ORAL_TABLET | ORAL | 0 refills | Status: DC
Start: 2020-10-03 — End: 2020-11-19

## 2020-10-03 NOTE — Progress Notes (Signed)
Subjective:    Patient ID: Shelby Thomas, female    DOB: 1945-12-07, 75 y.o.   MRN: [id number]  HPI  Pt is a 75 yo female with scolosis, degenerative disc disease of cervical/thoracic spine who is non-ambulatory presents to the clinic with her son to discuss new acute lower back pain for the last few days. She denies any trauma or fall. She just had bone density and was normal on 08/2020. She denies any radation of pain past her buttocks. No lower ext strength changes, bowel or bladder dysfunction. She has been taking OTC Doans pill which do help with pain. She was having problems sleeping the other night due to pain. Movement makes worse. Sitting for long periods of time makes worse. No urinary symptoms, fever, chills, body aches.   .. Active Ambulatory Problems    Diagnosis Date Noted  . Hyperlipidemia 04/10/2006  . HYPERTENSION, BENIGN ESSENTIAL 04/02/2006  . Pulmonary hypertension (HCC) 12/17/2006  . VALVULAR HEART DISEASE 08/14/2006  . ESOPHAGEAL STRICTURE 08/18/2007  . GERD 07/19/2007  . HAIR LOSS 04/19/2007  . History of total left hip arthroplasty 04/19/2007  . History of total knee arthroplasty, left 03/02/2008  . ARTHRITIS, CERVICAL SPINE 05/08/2009  . Lumbar radiculopathy 12/29/2007  . Scoliosis 12/17/2006  . ANKLE EDEMA 12/29/2007  . Diabetes mellitus type 2 in obese (HCC) 11/08/2010  . Morbid obesity (HCC) 07/03/2011  . PVD (peripheral vascular disease) (HCC) 03/31/2012  . OSA on CPAP 10/12/2013  . Pain and swelling of lower extremity 12/31/2015  . Primary osteoarthritis of left shoulder 07/07/2016  . Scoliosis of thoracic spine 12/04/2016  . Hemoglobin low 03/04/2017  . Arthrofibrosis of knee joint, left 02/01/2014  . Pseudophakia of both eyes 04/22/2016  . Aortic atherosclerosis (HCC) 03/30/2017  . Nocturnal hypoxia 06/11/2018  . BRBPR (bright red blood per rectum) 05/05/2019  . H/O congenital atrial septal defect (ASD) repair 03/22/2020  . Iron deficiency  anemia due to chronic blood loss 04/23/2020  . Pancreatic lesion 04/23/2020  . Weakness of both lower extremities 04/23/2020  . Acute bilateral low back pain without sciatica 10/05/2020   Resolved Ambulatory Problems    Diagnosis Date Noted  . PVD 03/02/2008  . Impaired fasting glucose 12/17/2006  . Cough with sputum 02/24/2013  . Nausea with vomiting 02/02/2014  . Preop cardiovascular exam 09/20/2014  . Left groin pain 06/18/2018  . Diarrhea 05/05/2019  . Nausea 05/05/2019   Past Medical History:  Diagnosis Date  . CHF (congestive heart failure) (HCC)   . Diabetes mellitus (HCC)   . Foraminal stenosis of lumbar region   . GERD (gastroesophageal reflux disease)   . Heart disease, congenital   . Hiatal hernia   . Hypertension   . MVRCS association   . OSA (obstructive sleep apnea)   . Pneumonia 2006  . Spine misalignment         Review of Systems See HPI.     Objective:   Physical Exam Vitals reviewed.  Constitutional:      Appearance: Normal appearance.  HENT:     Head: Normocephalic.  Cardiovascular:     Rate and Rhythm: Normal rate.     Pulses: Normal pulses.  Pulmonary:     Effort: Pulmonary effort is normal.  Musculoskeletal:     Right lower leg: No edema.     Left lower leg: No edema.     Comments: Some tenderness to palpation over paraspinal lumbar area, bilateral.  Lower ext strength 5/5.  Noted thoracic scoliosis.  In a wheelchair but with bending forward more pain in low back.   Neurological:     General: No focal deficit present.     Mental Status: She is alert and oriented to person, place, and time.  Psychiatric:        Mood and Affect: Mood normal.           Assessment & Plan:  Marland KitchenMarland KitchenDoris was seen today for back pain.  Diagnoses and all orders for this visit:  Acute bilateral low back pain without sciatica -     DG Lumbar Spine Complete; Future -     predniSONE (DELTASONE) 50 MG tablet; Take one tablet for 5 days. -      methocarbamol (ROBAXIN) 500 MG tablet; Take 1 tablet (500 mg total) by mouth 3 (three) times daily.  Scoliosis, unspecified scoliosis type, unspecified spinal region   Pain concerning for compression fracture but less likely due to normal dexa scan.  Hx of DDD of spine and scolosis. Certainly reason for pain.  Need xray.  Prednisone burst with muscle relaxer given.  Try to stick with tylenol for pain. Her kidney function is good and ok to have Doans every now and again. Take with food as to not irritate your stomach.  Discussed icy hot patches, tens unit, massage, pillow under legs when sleeping at night and recliner to take pressure off back.  If pain persist. PT could help or consider Dr. Karie Schwalbe for interventional planning or gabapentin for pain.

## 2020-10-03 NOTE — Patient Instructions (Signed)

## 2020-10-05 ENCOUNTER — Encounter: Payer: Self-pay | Admitting: Physician Assistant

## 2020-10-05 DIAGNOSIS — M545 Low back pain, unspecified: Secondary | ICD-10-CM | POA: Insufficient documentation

## 2020-10-05 DIAGNOSIS — M5136 Other intervertebral disc degeneration, lumbar region: Secondary | ICD-10-CM | POA: Insufficient documentation

## 2020-10-05 DIAGNOSIS — M51369 Other intervertebral disc degeneration, lumbar region without mention of lumbar back pain or lower extremity pain: Secondary | ICD-10-CM | POA: Insufficient documentation

## 2020-10-05 NOTE — Progress Notes (Signed)
There is lots of arthritis and disc space narrowing with the ongoing scoliosis. No acute findings. Lots of reasons to have pain. Posture and targeting muscle relaxation is the best treatment. How are you doing today?

## 2020-11-19 ENCOUNTER — Ambulatory Visit (INDEPENDENT_AMBULATORY_CARE_PROVIDER_SITE_OTHER): Payer: Medicare HMO | Admitting: Family Medicine

## 2020-11-19 ENCOUNTER — Encounter: Payer: Self-pay | Admitting: Family Medicine

## 2020-11-19 ENCOUNTER — Other Ambulatory Visit: Payer: Self-pay

## 2020-11-19 VITALS — BP 144/61 | HR 81 | Ht 64.0 in | Wt 198.0 lb

## 2020-11-19 DIAGNOSIS — R601 Generalized edema: Secondary | ICD-10-CM | POA: Diagnosis not present

## 2020-11-19 DIAGNOSIS — E1169 Type 2 diabetes mellitus with other specified complication: Secondary | ICD-10-CM

## 2020-11-19 DIAGNOSIS — E669 Obesity, unspecified: Secondary | ICD-10-CM | POA: Diagnosis not present

## 2020-11-19 DIAGNOSIS — I872 Venous insufficiency (chronic) (peripheral): Secondary | ICD-10-CM | POA: Diagnosis not present

## 2020-11-19 LAB — POCT GLYCOSYLATED HEMOGLOBIN (HGB A1C): Hemoglobin A1C: 6.5 % — AB (ref 4.0–5.6)

## 2020-11-19 LAB — POCT UA - MICROALBUMIN
Albumin/Creatinine Ratio, Urine, POC: <30
Creatinine, POC: 300 mg/dL
Microalbumin Ur, POC: 30 mg/L

## 2020-11-19 NOTE — Progress Notes (Signed)
Established Patient Office Visit  Subjective:  Patient ID: Shelby Thomas, female    DOB: 02/20/1946  Age: 75 y.o. MRN: [id number]  CC:  Chief Complaint  Patient presents with   Diabetes    HPI Shelby Thomas presents for 78-month follow-up.  She is here today with her husband.  Diabetes - no hypoglycemic events. No wounds or sores that are not healing well. No increased thirst or urination. Checking glucose at home. Taking medications as prescribed without any side effects.    Skin on her legs look darker in the last month.  She has not been wearing her compression stockings like she usually does in the winter months and she has noted she has been swelling a little bit more.  She says the skin feels a little sensitive at times but no burning or tingling or numbness etc..  Past Medical History:  Diagnosis Date   CHF (congestive heart failure) (HCC)    mild   Diabetes mellitus (HCC)    Foraminal stenosis of lumbar region    L4--L5   GERD (gastroesophageal reflux disease)    Heart disease, congenital    large ASD, Sees Dr. 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 Inge Rise   REPLACEMENT TOTAL KNEE Left 12/08/13   Dr. Dossie Arbour   TOTAL HIP ARTHROPLASTY Left     Family History  Problem Relation Age of Onset   Diabetes Mother    Stroke Mother    CAD Mother        CABG in her 12s    Social History   Socioeconomic History   Marital status: Married    Spouse name: Shelby Thomas   Number of children: 2   Years of education: 14.5   Highest education level: Some college, no degree   Occupational History    Comment: Retired.  Tobacco Use   Smoking status: Never   Smokeless tobacco: Never  Vaping Use   Vaping Use: Never used  Substance and Sexual Activity   Alcohol use: No   Drug use: Never   Sexual activity: Not on file  Other Topics Concern   Not on file  Social History Narrative   Lives at home with Shelby Thomas. She is retired. She works on her tablet and does puzzles.   Social Determinants of Health   Financial Resource Strain: Low Risk    Difficulty of Paying Living Expenses: Not hard at all  Food Insecurity: No Food Insecurity   Worried About Programme researcher, broadcasting/film/video in the Last Year: Never true   Ran Out of Food in the Last Year: Never true  Transportation Needs: No Transportation Needs   Lack of Transportation (Medical): No   Lack of Transportation (Non-Medical): No  Physical Activity: Inactive   Days of Exercise per Week: 0 days   Minutes of Exercise per Session: 0 min  Stress: No Stress Concern Present   Feeling of Stress : Not at all  Social Connections: Moderately Integrated  Frequency of Communication with Friends and Family: More than three times a week   Frequency of Social Gatherings with Friends and Family: Once a week   Attends Religious Services: More than 4 times per year   Active Member of [employer] or Organizations: No   Attends Banker Meetings: Never   Marital Status: Married  Catering manager Violence: Not At Risk   Fear of Current or Ex-Partner: No   Emotionally Abused: No   Physically Abused: No   Sexually Abused: No    Outpatient Medications Prior to Visit  Medication Sig Dispense Refill   AMBULATORY NON FORMULARY MEDICATION Medication Name: Free Style InsuLinx test strips.  Use daily as directed 50 strip 12   AMBULATORY NON FORMULARY MEDICATION Medication Name: Oxygen with CPAP qhs at 2L/min. Diagnosis: Pulmonary Hypertension, Dyspnea on Exertion 1 Units prn   diclofenac Sodium (VOLTAREN) 1 % GEL Apply 2 g topically 4  (four) times daily. Up to 4 joints 350 g 1   ferrous sulfate 324 MG TBEC Take 1 tablet by mouth daily with breakfast.     metFORMIN (GLUCOPHAGE-XR) 500 MG 24 hr tablet Take 1 tablet (500 mg total) by mouth daily with breakfast. 90 tablet 1   Multiple Vitamin (MULTIVITAMIN) capsule Take 1 capsule by mouth daily.     pantoprazole (PROTONIX) 40 MG tablet TAKE ONE TABLET (40 MG TOTAL) BY MOUTH DAILY 30 tablet 3   simvastatin (ZOCOR) 40 MG tablet TAKE ONE TABLET BY MOUTH DAILY 90 tablet 3   methocarbamol (ROBAXIN) 500 MG tablet Take 1 tablet (500 mg total) by mouth 3 (three) times daily. 60 tablet 0   predniSONE (DELTASONE) 50 MG tablet Take one tablet for 5 days. 5 tablet 0   No facility-administered medications prior to visit.    Allergies  Allergen Reactions   Ramipril     REACTION: cough   Tramadol Other (See Comments)    Nausea    ROS Review of Systems    Objective:    Physical Exam Constitutional:      Appearance: Normal appearance. She is well-developed.  HENT:     Head: Normocephalic and atraumatic.  Cardiovascular:     Rate and Rhythm: Normal rate and regular rhythm.     Heart sounds: Normal heart sounds.  Pulmonary:     Effort: Pulmonary effort is normal.     Breath sounds: Normal breath sounds.  Skin:    General: Skin is warm and dry.     Comments: Trace edema around ankles bilaterally with some hyperpigmentation.  No wounds or sores.  Neurological:     Mental Status: She is alert and oriented to person, place, and time.  Psychiatric:        Behavior: Behavior normal.    BP (!) 144/61   Pulse 81   Ht 5\' 4"  (1.626 m)   Wt 198 lb (89.8 kg)   SpO2 99%   BMI 33.99 kg/m  Wt Readings from Last 3 Encounters:  11/19/20 198 lb (89.8 kg)  10/03/20 198 lb (89.8 kg)  07/20/20 196 lb (88.9 kg)     Health Maintenance Due  Topic Date Due   COVID-19 Vaccine (3 - Booster for Pfizer series) 12/27/2019   TETANUS/TDAP  10/28/2020    There are no preventive care  reminders to display for this patient.  Lab Results  Component Value Date   TSH 0.78 11/25/2017   Lab Results  Component Value Date   WBC 5.5 12/21/2019   HGB 7.4 (  A) 03/28/2020   HCT 40.5 12/21/2019   MCV 97.4 12/21/2019   PLT 268 12/21/2019   Lab Results  Component Value Date   NA 144 07/20/2020   K 4.3 07/20/2020   CO2 33 (H) 07/20/2020   GLUCOSE 103 (H) 07/20/2020   BUN 9 07/20/2020   CREATININE 0.60 07/20/2020   BILITOT 0.4 12/21/2019   ALKPHOS 90 10/14/2016   AST 14 12/21/2019   ALT 10 12/21/2019   PROT 7.1 12/21/2019   ALBUMIN 3.7 10/14/2016   CALCIUM 9.8 07/20/2020   Lab Results  Component Value Date   CHOL 172 12/21/2019   Lab Results  Component Value Date   HDL 53 12/21/2019   Lab Results  Component Value Date   LDLCALC 102 (H) 12/21/2019   Lab Results  Component Value Date   TRIG 83 12/21/2019   Lab Results  Component Value Date   CHOLHDL 3.2 12/21/2019   Lab Results  Component Value Date   HGBA1C 6.5 (A) 11/19/2020      Assessment & Plan:   Problem List Items Addressed This Visit       Endocrine   Diabetes mellitus type 2 in obese (HCC) - Primary    Well controlled. Continue current regimen. Follow up in  73mo  Due for labs in August.         Relevant Orders   POCT glycosylated hemoglobin (Hb A1C) (Completed)   CBC   COMPLETE METABOLIC PANEL WITH GFR   Lipid panel   B12   POCT UA - Microalbumin (Completed)     Other   ANKLE EDEMA   Other Visit Diagnoses     Venous stasis dermatitis of both lower extremities           Darker skin of the lower extremities we discussed that it could be from venous stasis dermatitis just she does have known venous stasis we discussed possibly getting ABIs just to check out for peripheral vascular disease.  Though she does not have pain numbness and no cold feet.  She says for now she will start wearing her compression socks again and see if that seems to help and if not she will let me  know.  No orders of the defined types were placed in this encounter.   Follow-up: Return in about 6 months (around 05/22/2021) for Diabetes follow-up.    Nani Gasser, MD

## 2020-11-19 NOTE — Assessment & Plan Note (Signed)
Well controlled. Continue current regimen. Follow up in  75mo  Due for labs in August.

## 2020-12-14 ENCOUNTER — Telehealth: Payer: Self-pay | Admitting: Family Medicine

## 2020-12-14 NOTE — Chronic Care Management (AMB) (Signed)
  Care Management  Note   12/14/2020 Name: Shelby Thomas MRN: [id number] DOB: 09-Apr-1946  Shelby Thomas is a 75 y.o. year old female who is a primary care patient of Metheney, Rene Kocher, MD. The care management team was consulted for assistance with chronic disease management and care coordination needs.   [name] was given information about Care Management services today including:  CCM service includes personalized support from designated clinical staff supervised by the physician, including individualized plan of care and coordination with other care providers 24/7 contact phone numbers for assistance for urgent and routine care needs. Service will only be billed when office clinical staff spend 20 minutes or more in a month to coordinate care. Only one practitioner may furnish and bill the service in a calendar month. The patient may stop CCM services at amy time (effective at the end of the month) by phone call to the office staff. The patient will be responsible for cost sharing (co-pay) or up to 20% of the service fee (after annual deductible is met)  Patient agreed to services and verbal consent obtained.  Follow up plan:   An initial telephone outreach has been scheduled for: 01/21/21 _0   Noelle Penner Upstream Scheduler

## 2020-12-15 ENCOUNTER — Other Ambulatory Visit: Payer: Self-pay | Admitting: Family Medicine

## 2021-01-02 LAB — HM DIABETES EYE EXAM

## 2021-01-18 ENCOUNTER — Telehealth: Payer: Self-pay | Admitting: Pharmacist

## 2021-01-18 ENCOUNTER — Other Ambulatory Visit: Payer: Self-pay | Admitting: Family Medicine

## 2021-01-18 NOTE — Chronic Care Management (AMB) (Signed)
    Chronic Care Management Pharmacy Assistant   Name: Shelby Thomas  MRN: [id number] DOB: 01-09-1946  Shelby Thomas is an 75 y.o. year old female who presents for his initial CCM visit with the clinical pharmacist.  Recent office visits:  11/19/20-Shelby D. Linford Arnold, MD (PCP) Seen for a four month follow up. Labs ordered. Follow up in 6 months. 10/03/20-Shelby L. Breeback, PA-C. Seen for back pain. Xray ordered. Start on methocarbamol (ROBAXIN) 500 MG tablet and Prednison 50 mg for 5 days. 07/23/20-Shelby D. Linford Arnold, MD (PCP) Seen for Medicare Wellness exam. Follow up in 1 year. 07/20/20-Shelby D. Linford Arnold, MD (PCP) Seen for a general follow up. Labs ordered. Start on Voltaren 1% gel. Follow up in 4 months.  Recent consult visits:  None noted  Hospital visits:  None in previous 6 months  Medications: Outpatient Encounter Medications as of 01/18/2021  Medication Sig   AMBULATORY NON FORMULARY MEDICATION Medication Name: Free Style InsuLinx test strips.  Use daily as directed   AMBULATORY NON FORMULARY MEDICATION Medication Name: Oxygen with CPAP qhs at 2L/min. Diagnosis: Pulmonary Hypertension, Dyspnea on Exertion   diclofenac Sodium (VOLTAREN) 1 % GEL Apply 2 g topically 4 (four) times daily. Up to 4 joints   ferrous sulfate 324 MG TBEC Take 1 tablet by mouth daily with breakfast.   metFORMIN (GLUCOPHAGE-XR) 500 MG 24 hr tablet Take 1 tablet (500 mg total) by mouth daily with breakfast.   Multiple Vitamin (MULTIVITAMIN) capsule Take 1 capsule by mouth daily.   pantoprazole (PROTONIX) 40 MG tablet TAKE ONE TABLET (40 MG TOTAL) BY MOUTH DAILY   simvastatin (ZOCOR) 40 MG tablet TAKE ONE TABLET BY MOUTH DAILY   No facility-administered encounter medications on file as of 01/18/2021.   Diclofenac Sodium (VOLTAREN) 1 % GEL Last filled:No medication dispenses to display since 01/19/18 Ferrous sulfate 324 MG TBEC Last filled:No medication dispenses to display since  01/19/18 MetFORMIN (GLUCOPHAGE-XR) 500 MG 24 hr tablet Last filled:No medication dispenses to display since 01/19/18 Pantoprazole (PROTONIX) 40 MG tablet Last filled:No medication dispenses to display since 01/19/18 Simvastatin (ZOCOR) 40 MG tablet Last filled:No medication dispenses to display since 01/19/18  Star Rating Drugs: MetFORMIN (GLUCOPHAGE-XR) 500 MG 24 hr tablet Last filled:No medication dispenses to display since 01/19/18 Simvastatin (ZOCOR) 40 MG tablet Last filled:No medication dispenses to display since 01/19/18  Rance Muir, RMA Health Concierge

## 2021-01-21 ENCOUNTER — Telehealth: Payer: Medicare HMO

## 2021-01-21 ENCOUNTER — Telehealth: Payer: Self-pay | Admitting: *Deleted

## 2021-01-21 ENCOUNTER — Telehealth: Payer: Self-pay | Admitting: Pharmacist

## 2021-01-21 NOTE — Telephone Encounter (Signed)
Patient's appt has been cancelled to avoid no show fee. AM 

## 2021-01-21 NOTE — Chronic Care Management (AMB) (Signed)
  Care Management   Note  01/21/2021 Name: [name] MRN: [id number] DOB: 27-Dec-1945  Shelby Thomas is a 75 y.o. year old female who is a primary care patient of Agapito Games, MD and is actively engaged with the care management team. I reached out to Shelby Thomas by phone today to assist with re-scheduling an initial visit with the Pharmacist  Follow up plan: Unsuccessful telephone outreach attempt made. A HIPAA compliant phone message was left for the patient providing contact information and requesting a return call.   Burman Nieves, CCMA Care Guide, Embedded Care Coordination [facility]  Care Management  Direct Dial: [phone]

## 2021-01-21 NOTE — Telephone Encounter (Signed)
[name] had her initial phone visit scheduled for CCM services with clinical pharmacist 01/21/21 at 11am.   No answer, attempted contact x2 and was unsuccessful, voicemail is full so unable to leave message.   Will route to schedule team to attempt reschedule.  Shelby Thomas

## 2021-01-25 NOTE — Chronic Care Management (AMB) (Signed)
  Care Management   Note  01/25/2021 Name: Shelby Thomas MRN: [id number] DOB: 07-Apr-1946  Shelby Thomas is a 75 y.o. year old female who is a primary care patient of Agapito Games, MD and is actively engaged with the care management team. I reached out to Shelby Thomas by phone today to assist with re-scheduling an initial visit with the Pharmacist  Follow up plan: Telephone appointment with care management team member scheduled for: 02/01/2021  Burman Nieves, CCMA Care Guide, Embedded Care Coordination [facility]  Care Management  Direct Dial: [phone]

## 2021-02-01 ENCOUNTER — Telehealth: Payer: Medicare HMO

## 2021-02-01 NOTE — Progress Notes (Deleted)
Current Barriers:  {PHARMCACYBARRIERS:21091514}  Pharmacist Clinical Goal(s):  Over the next *** days, patient will {PHARMACYGOALCHOICES:25079} through collaboration with PharmD and provider.   Interventions: 1:1 collaboration with Hali Marry, MD regarding development and update of comprehensive plan of care as evidenced by provider attestation and co-signature Inter-disciplinary care team collaboration (see longitudinal plan of care) Comprehensive medication review performed; medication list updated in electronic medical record  Diabetes:  Uncontrolled/controlled; current treatment:***; a1c 6.5  Current glucose readings: fasting glucose: ***, post prandial glucose: ***  Denies/reports hypoglycemic/hyperglycemic symptoms  Current meal patterns: breakfast: ***; lunch: ***; dinner: ***; snacks: ***; drinks: ***  Current exercise: ***  {PHARMACYINTERVENTION:21091513} and  Hyperlipidemia:  Uncontrolled/controlled; current treatment:***;   Medications previously tried: ***   Current dietary patterns: ***  {PHARMACYINTERVENTION:21091513}  Patient Goals/Self-Care Activities Over the next *** days, patient will:  {PHARMACYPATIENTGOALS:25081}  Follow Up Plan: {CM FOLLOW UP HYWV:37106} ***  Current Barriers:  {PHARMCACYBARRIERS:21091514}  Pharmacist Clinical Goal(s):  Over the next *** days, patient will {PHARMACYGOALCHOICES:25079} through collaboration with PharmD and provider.   Interventions: 1:1 collaboration with Hali Marry, MD regarding development and update of comprehensive plan of care as evidenced by provider attestation and co-signature Inter-disciplinary care team collaboration (see longitudinal plan of care) Comprehensive medication review performed; medication list updated in electronic medical record  {PHARMACYDISEASECHOICES:21091512}  Patient Goals/Self-Care Activities Over the next *** days, patient will:   {PHARMACYPATIENTGOALS:25081}  Follow Up Plan: {CM FOLLOW UP YIRS:85462} ***     Chronic Care Management Pharmacy Note  02/01/2021 Name:  Shelby Thomas MRN:  [id number] DOB:  11-21-1945  Summary:  Recommendations/Changes made from today's visit:  Plan:  Subjective: Shelby Thomas is an 75 y.o. year old female who is a primary patient of Metheney, Rene Kocher, MD.  The CCM team was consulted for assistance with disease management and care coordination needs.    {CCMTELEPHONEFACETOFACE:21091510} for {CCMINITIALFOLLOWUPCHOICE:21091511} in response to provider referral for pharmacy case management and/or care coordination services.   Consent to Services:  {CCMCONSENTOPTIONS:25074}  Patient Care Team: Hali Marry, MD as PCP - General (Family Medicine) Early, Coralee Pesa, NP as Nurse Practitioner (Nurse Practitioner) Darius Bump, [facility] as Pharmacist (Pharmacist)  Recent office visits:  11/19/20-Catherine D. Madilyn Fireman, MD (PCP) Seen for a four month follow up. Labs ordered. Follow up in 6 months. 10/03/20-[name] L. Breeback, PA-C. Seen for back pain. Xray ordered. Start on methocarbamol (ROBAXIN) 500 MG tablet and Prednison 50 mg for 5 days. 07/23/20-Catherine D. Madilyn Fireman, MD (PCP) Seen for Medicare Wellness exam. Follow up in 1 year. 07/20/20-Catherine D. Madilyn Fireman, MD (PCP) Seen for a general follow up. Labs ordered. Start on Voltaren 1% gel. Follow up in 4 months.   Recent consult visits:  None noted   Hospital visits:  None in previous 6 months    Objective:  Lab Results  Component Value Date   CREATININE 0.60 07/20/2020   CREATININE 0.68 12/21/2019   CREATININE 0.66 12/27/2018    Lab Results  Component Value Date   HGBA1C 6.5 (A) 11/19/2020   Last diabetic Eye exam:  Lab Results  Component Value Date/Time   HMDIABEYEEXA No Retinopathy 01/02/2021 12:00 AM    Last diabetic Foot exam: No results found for: HMDIABFOOTEX      Component Value Date/Time    CHOL 172 12/21/2019 1204   TRIG 83 12/21/2019 1204   HDL 53 12/21/2019 1204   CHOLHDL 3.2 12/21/2019 1204   VLDL 17 03/03/2016 1128   LDLCALC 102 (H) 12/21/2019 1204  LDLDIRECT 105 (H) 11/23/2008 2232    Hepatic Function Latest Ref Rng & Units 12/21/2019 12/27/2018 11/25/2017  Total Protein 6.1 - 8.1 g/dL 7.1 6.9 7.0  Albumin 3.6 - 5.1 g/dL - - -  AST 10 - 35 U/L _0 ALT 6 - 29 U/L _1 Alk Phosphatase 33 - 130 U/L - - -  Total Bilirubin 0.2 - 1.2 mg/dL 0.4 0.5 0.4    Lab Results  Component Value Date/Time   TSH 0.78 11/25/2017 11:12 AM   TSH 0.88 12/24/2015 02:58 PM    CBC Latest Ref Rng & Units 03/28/2020 12/21/2019 05/23/2019  WBC 3.8 - 10.8 Thousand/uL - 5.5 6.3  Hemoglobin 11 - 14.6 g/dL 7.4(A) 13.2 7.7(L)  Hematocrit 35.0 - 45.0 % - 40.5 25.4(L)  Platelets 140 - 400 Thousand/uL - 268 387    No results found for: VD25OH  Clinical ASCVD: {YES/NO:21197} The 10-year ASCVD risk score (Arnett DK, et al., 2019) is: 32.6%   Values used to calculate the score:     Age: 75 years     Sex: Female     Is Non-Hispanic African American: Yes     Diabetic: Yes     Tobacco smoker: No     Systolic Blood Pressure: 545 mmHg     Is BP treated: No     HDL Cholesterol: 53 mg/dL     Total Cholesterol: 172 mg/dL    Other: (CHADS2VASc if Afib, PHQ9 if depression, MMRC or CAT for COPD, ACT, DEXA)  Social History   Tobacco Use  Smoking Status Never  Smokeless Tobacco Never   BP Readings from Last 3 Encounters:  11/19/20 (!) 144/61  10/03/20 (!) 122/45  07/20/20 (!) 127/40   Pulse Readings from Last 3 Encounters:  11/19/20 81  10/03/20 80  07/20/20 83   Wt Readings from Last 3 Encounters:  11/19/20 198 lb (89.8 kg)  10/03/20 198 lb (89.8 kg)  07/20/20 196 lb (88.9 kg)    Assessment: Review of patient past medical history, allergies, medications, health status, including review of consultants reports, laboratory and other test data, was performed as part of  comprehensive evaluation and provision of chronic care management services.   SDOH:  (Social Determinants of Health) assessments and interventions performed:    CCM Care Plan  Allergies  Allergen Reactions   Ramipril     REACTION: cough   Tramadol Other (See Comments)    Nausea    Medications Reviewed Today     Reviewed by Hali Marry, MD (Physician) on 11/19/20 at 23  Med List Status: <None>   Medication Order Taking? Sig Documenting Provider Last Dose Status Informant  AMBULATORY NON FORMULARY MEDICATION [id number] Yes Medication Name: Free Style InsuLinx test strips.  Use daily as directed Dell Ponto, DO Taking Active   AMBULATORY NON [place] [id number] Yes Medication Name: Oxygen with CPAP qhs at 2L/min. Diagnosis: Pulmonary Hypertension, Dyspnea on Exertion Emeterio Reeve, DO Taking Active   diclofenac Sodium (VOLTAREN) 1 % GEL [id number] Yes Apply 2 g topically 4 (four) times daily. Up to 4 joints Hali Marry, MD Taking Active   ferrous sulfate 324 MG TBEC [id number] Yes Take 1 tablet by mouth daily with breakfast. [provider] Taking Active   metFORMIN (GLUCOPHAGE-XR) 500 MG 24 hr tablet [id number] Yes Take 1 tablet (500 mg total) by mouth daily with breakfast. Hali Marry, MD Taking Active   Multiple Vitamin (MULTIVITAMIN) capsule [id number] Yes Take 1  capsule by mouth daily. [provider] Taking Active   pantoprazole (PROTONIX) 40 MG tablet [id number] Yes TAKE ONE TABLET (40 MG TOTAL) BY MOUTH DAILY Hali Marry, MD Taking Active   simvastatin (ZOCOR) 40 MG tablet [id number] Yes TAKE ONE TABLET BY MOUTH DAILY Hali Marry, MD Taking Active             Patient Active Problem List   Diagnosis Date Noted   Acute bilateral low back pain without sciatica 10/05/2020   DDD (degenerative disc disease), lumbar 10/05/2020   Iron deficiency anemia due to chronic blood loss 04/23/2020   Pancreatic  lesion 04/23/2020   Weakness of both lower extremities 04/23/2020   H/O congenital atrial septal defect (ASD) repair 03/22/2020   BRBPR (bright red blood per rectum) 05/05/2019   Nocturnal hypoxia 06/11/2018   Aortic atherosclerosis ([place]) 03/30/2017   Hemoglobin low 03/04/2017   Scoliosis of thoracic spine 12/04/2016   Primary osteoarthritis of left shoulder 07/07/2016   Pseudophakia of both eyes 04/22/2016   Pain and swelling of lower extremity 12/31/2015   Arthrofibrosis of knee joint, left 02/01/2014   OSA on CPAP 10/12/2013   PVD (peripheral vascular disease) ([place]) 03/31/2012   Morbid obesity ([place]) 07/03/2011   Diabetes mellitus type 2 in obese ([place]) 11/08/2010   ARTHRITIS, CERVICAL SPINE 05/08/2009   History of total knee arthroplasty, left 03/02/2008   Lumbar radiculopathy 12/29/2007   ANKLE EDEMA 12/29/2007   ESOPHAGEAL STRICTURE 08/18/2007   GERD 07/19/2007   HAIR LOSS 04/19/2007   History of total left hip arthroplasty 04/19/2007   Pulmonary hypertension ([place]) 12/17/2006   Scoliosis 12/17/2006   VALVULAR HEART DISEASE 08/14/2006   Hyperlipidemia 04/10/2006    Immunization History  Administered Date(s) Administered   Fluad Quad(high Dose 65+) 12/27/2018, 01/11/2020   Influenza Whole 04/02/2006, 04/19/2007   Influenza, High Dose Seasonal PF 12/24/2016, 02/18/2018   Influenza, Seasonal, Injecte, Preservative Fre 03/31/2012   Influenza,inj,Quad PF,6+ Mos 02/28/2013, 03/02/2014, 03/20/2015, 12/24/2015   PFIZER(Purple Top)SARS-COV-2 Vaccination 07/06/2019, 07/27/2019   Pneumococcal Conjugate-13 11/17/2014   Pneumococcal Polysaccharide-23 04/02/2006, 07/01/2012   Tdap 10/29/2010   Zoster, Live 12/23/2010    Conditions to be addressed/monitored: {CCM ASSESSMENT DISEASE OPTIONS:25047}  There are no care plans that you recently modified to display for this patient.   Medication Assistance: {MEDASSISTANCEINFO:25044}  Patient's preferred pharmacy is:  [place], [place] [facility] [address] [place] [address] Phone: [phone] Fax: [phone]  Uses pill box? {Yes or If no, why not?:20788} Pt endorses ***% compliance  Follow Up:  {FOLLOWUP:24991}  Plan: {CM FOLLOW UP PLAN:25073}  SIG***

## 2021-02-13 ENCOUNTER — Ambulatory Visit: Payer: Medicare HMO

## 2021-03-05 ENCOUNTER — Telehealth: Payer: Self-pay

## 2021-03-05 NOTE — Chronic Care Management (AMB) (Signed)
    Chronic Care Management Pharmacy Assistant   Name: Shelby Thomas  MRN: [id number] DOB: Dec 28, 1945  Shelby Thomas is an 75 y.o. year old female who presents for his initial CCM visit with the clinical pharmacist.   Recent office visits:  11/19/20 Agapito Games MD - Seen for diabetes - Labs ordered - No medication changes noted - Follow up in 6 months  10/03/20 Tandy Gaw PA - Seen for acute bilateral low back pain - X Ray of lumbar ordered - Start prednisone 50 mg, 1 tablet for 5 days - Start methocarbamol 500 mg tablet, 1 tablet by mouth 3 times daily - No follow up noted   Recent consult visits:  None noted   Hospital visits:  None in previous 6 months  Medications: Outpatient Encounter Medications as of 03/05/2021  Medication Sig   AMBULATORY NON FORMULARY MEDICATION Medication Name: Free Style InsuLinx test strips.  Use daily as directed   AMBULATORY NON FORMULARY MEDICATION Medication Name: Oxygen with CPAP qhs at 2L/min. Diagnosis: Pulmonary Hypertension, Dyspnea on Exertion   diclofenac Sodium (VOLTAREN) 1 % GEL Apply 2 g topically 4 (four) times daily. Up to 4 joints   ferrous sulfate 324 MG TBEC Take 1 tablet by mouth daily with breakfast.   metFORMIN (GLUCOPHAGE-XR) 500 MG 24 hr tablet TAKE ONE TABLET (500MG  TOTAL) BY MOUTH DAILY WITH BREAKFAST   Multiple Vitamin (MULTIVITAMIN) capsule Take 1 capsule by mouth daily.   pantoprazole (PROTONIX) 40 MG tablet TAKE ONE TABLET (40 MG TOTAL) BY MOUTH DAILY   simvastatin (ZOCOR) 40 MG tablet TAKE ONE TABLET BY MOUTH DAILY   No facility-administered encounter medications on file as of 03/05/2021.    Care Gaps: Zoster Vaccines- Shingrix - Never done  COVID-19 Vaccine (3 - Booster for Pfizer series) - Last completed: Jul 27, 2019 TETANUS/TDAP Last completed: Oct 29, 2010 INFLUENZA VACCINE - Last completed: Jan 11, 2020     diclofenac Sodium (VOLTAREN) 1 % GEL - Last filled 07/20/20 ferrous sulfate 324 MG TBEC- Last  filled 04/23/20 metFORMIN (GLUCOPHAGE-XR) 500 MG 24 hr tablet- Last filled 01/21/21 90 DS  Multiple Vitamin (MULTIVITAMIN) capsule- Last filled 10/27/10 pantoprazole (PROTONIX) 40 MG tablet- Last filled 12/17/20 30 DS  simvastatin (ZOCOR) 40 MG tablet- Last filled 04/25/20 90 DS     Star Rating Drugs: metFORMIN (GLUCOPHAGE-XR) 500 MG 24 hr tablet- Last filled 01/21/21 90 DS  simvastatin (ZOCOR) 40 MG tablet- Last filled 04/25/20 90 DS     04/27/20, CMA

## 2021-03-06 ENCOUNTER — Ambulatory Visit (INDEPENDENT_AMBULATORY_CARE_PROVIDER_SITE_OTHER): Payer: Medicare HMO | Admitting: Pharmacist

## 2021-03-06 DIAGNOSIS — E669 Obesity, unspecified: Secondary | ICD-10-CM | POA: Diagnosis not present

## 2021-03-06 DIAGNOSIS — E1169 Type 2 diabetes mellitus with other specified complication: Secondary | ICD-10-CM

## 2021-03-06 DIAGNOSIS — I10 Essential (primary) hypertension: Secondary | ICD-10-CM | POA: Diagnosis not present

## 2021-03-06 DIAGNOSIS — E7800 Pure hypercholesterolemia, unspecified: Secondary | ICD-10-CM

## 2021-03-06 LAB — COMPLETE METABOLIC PANEL WITH GFR
AG Ratio: 1.5 (calc) (ref 1.0–2.5)
ALT: 16 U/L (ref 6–29)
AST: 19 U/L (ref 10–35)
Albumin: 4.3 g/dL (ref 3.6–5.1)
Alkaline phosphatase (APISO): 73 U/L (ref 37–153)
BUN: 8 mg/dL (ref 7–25)
CO2: 33 mmol/L — ABNORMAL HIGH (ref 20–32)
Calcium: 9.6 mg/dL (ref 8.6–10.4)
Chloride: 105 mmol/L (ref 98–110)
Creat: 0.67 mg/dL (ref 0.60–1.00)
Globulin: 2.9 g/dL (calc) (ref 1.9–3.7)
Glucose, Bld: 122 mg/dL — ABNORMAL HIGH (ref 65–99)
Potassium: 4.7 mmol/L (ref 3.5–5.3)
Sodium: 146 mmol/L (ref 135–146)
Total Bilirubin: 0.5 mg/dL (ref 0.2–1.2)
Total Protein: 7.2 g/dL (ref 6.1–8.1)
eGFR: 92 mL/min/{1.73_m2} (ref >=60)

## 2021-03-06 LAB — CBC
HCT: 42.1 % (ref 35.0–45.0)
Hemoglobin: 13.8 g/dL (ref 11.7–15.5)
MCH: 32.8 pg (ref 27.0–33.0)
MCHC: 32.8 g/dL (ref 32.0–36.0)
MCV: 100 fL (ref 80.0–100.0)
MPV: 10.2 fL (ref 7.5–12.5)
Platelets: 246 10*3/uL (ref 140–400)
RBC: 4.21 10*6/uL (ref 3.80–5.10)
RDW: 12 % (ref 11.0–15.0)
WBC: 4.3 10*3/uL (ref 3.8–10.8)

## 2021-03-06 LAB — LIPID PANEL
Cholesterol: 197 mg/dL (ref <200)
HDL: 74 mg/dL (ref >=50)
LDL Cholesterol (Calc): 104 mg/dL (calc) — ABNORMAL HIGH
Non-HDL Cholesterol (Calc): 123 mg/dL (calc) (ref <130)
Total CHOL/HDL Ratio: 2.7 (calc) (ref <5.0)
Triglycerides: 92 mg/dL (ref <150)

## 2021-03-06 LAB — VITAMIN B12: Vitamin B-12: 843 pg/mL (ref 200–1100)

## 2021-03-06 NOTE — Progress Notes (Signed)
Chronic Care Management Pharmacy Note  03/06/2021 Name:  Shelby Thomas MRN:  [id number] DOB:  Apr 05, 1946  Summary: addressed DM, HLD. Patient mentioned having trouble obtaining CPAP supplies and is working with her insurance company to resolve.  Recommendations/Changes made from today's visit: recommend patient obtain ordered labs, facilitated lab collection, otherwise no medication changes at this time.    Plan: f/u with pharmacist in 6-8 months  Subjective: Shelby Thomas is an 75 y.o. year old female who is a primary patient of Metheney, Rene Kocher, MD.  The CCM team was consulted for assistance with disease management and care coordination needs.    Engaged with patient face to face for initial visit in response to provider referral for pharmacy case management and/or care coordination services.   Consent to Services:  The patient was given information about Chronic Care Management services, agreed to services, and gave verbal consent prior to initiation of services.  Please see initial visit note for detailed documentation.   Patient Care Team: Hali Marry, MD as PCP - General (Family Medicine) Early, Coralee Pesa, NP as Nurse Practitioner (Nurse Practitioner) Darius Bump, [facility] as Pharmacist (Pharmacist)  Recent office visits:  11/19/20 Hali Marry MD - Seen for diabetes - Labs ordered - No medication changes noted - Follow up in 6 months  10/03/20 Iran Planas PA - Seen for acute bilateral low back pain - X Ray of lumbar ordered - Start prednisone 50 mg, 1 tablet for 5 days - Start methocarbamol 500 mg tablet, 1 tablet by mouth 3 times daily - No follow up noted    Recent consult visits:  None noted    Hospital visits:  None in previous 6 months  Objective:  Lab Results  Component Value Date   CREATININE 0.60 07/20/2020   CREATININE 0.68 12/21/2019   CREATININE 0.66 12/27/2018    Lab Results  Component Value Date   HGBA1C 6.5 (A) 11/19/2020    Last diabetic Eye exam:  Lab Results  Component Value Date/Time   HMDIABEYEEXA No Retinopathy 01/02/2021 12:00 AM          Component Value Date/Time   CHOL 172 12/21/2019 1204   TRIG 83 12/21/2019 1204   HDL 53 12/21/2019 1204   CHOLHDL 3.2 12/21/2019 1204   VLDL 17 03/03/2016 1128   LDLCALC 102 (H) 12/21/2019 1204   LDLDIRECT 105 (H) 11/23/2008 2232    Hepatic Function Latest Ref Rng & Units 12/21/2019 12/27/2018 11/25/2017  Total Protein 6.1 - 8.1 g/dL 7.1 6.9 7.0  Albumin 3.6 - 5.1 g/dL - - -  AST 10 - 35 U/L _0 ALT 6 - 29 U/L _1 Alk Phosphatase 33 - 130 U/L - - -  Total Bilirubin 0.2 - 1.2 mg/dL 0.4 0.5 0.4    Lab Results  Component Value Date/Time   TSH 0.78 11/25/2017 11:12 AM   TSH 0.88 12/24/2015 02:58 PM    CBC Latest Ref Rng & Units 03/28/2020 12/21/2019 05/23/2019  WBC 3.8 - 10.8 Thousand/uL - 5.5 6.3  Hemoglobin 11 - 14.6 g/dL 7.4(A) 13.2 7.7(L)  Hematocrit 35.0 - 45.0 % - 40.5 25.4(L)  Platelets 140 - 400 Thousand/uL - 268 387    No results found for: VD25OH  Clinical ASCVD: Yes  The 10-year ASCVD risk score (Arnett DK, et al., 2019) is: 34.3%   Values used to calculate the score:     Age: 40 years     Sex: Female  Is Non-Hispanic African American: Yes     Diabetic: Yes     Tobacco smoker: No     Systolic Blood Pressure: 741 mmHg     Is BP treated: No     HDL Cholesterol: 53 mg/dL     Total Cholesterol: 172 mg/dL    Other: (CHADS2VASc if Afib, PHQ9 if depression, MMRC or CAT for COPD, ACT, DEXA)  Social History   Tobacco Use  Smoking Status Never  Smokeless Tobacco Never   BP Readings from Last 3 Encounters:  11/19/20 (!) 144/61  10/03/20 (!) 122/45  07/20/20 (!) 127/40   Pulse Readings from Last 3 Encounters:  11/19/20 81  10/03/20 80  07/20/20 83   Wt Readings from Last 3 Encounters:  11/19/20 198 lb (89.8 kg)  10/03/20 198 lb (89.8 kg)  07/20/20 196 lb (88.9 kg)    Assessment: Review of patient past medical  history, allergies, medications, health status, including review of consultants reports, laboratory and other test data, was performed as part of comprehensive evaluation and provision of chronic care management services.   SDOH:  (Social Determinants of Health) assessments and interventions performed:    CCM Care Plan  Allergies  Allergen Reactions   Ramipril     REACTION: cough   Tramadol Other (See Comments)    Nausea    Medications Reviewed Today     Reviewed by Hali Marry, MD (Physician) on 11/19/20 at 37  Med List Status: <None>   Medication Order Taking? Sig Documenting Provider Last Dose Status Informant  AMBULATORY NON FORMULARY MEDICATION [id number] Yes Medication Name: Free Style InsuLinx test strips.  Use daily as directed Dell Ponto, DO Taking Active   AMBULATORY NON [place] [id number] Yes Medication Name: Oxygen with CPAP qhs at 2L/min. Diagnosis: Pulmonary Hypertension, Dyspnea on Exertion Emeterio Reeve, DO Taking Active   diclofenac Sodium (VOLTAREN) 1 % GEL [id number] Yes Apply 2 g topically 4 (four) times daily. Up to 4 joints Hali Marry, MD Taking Active   ferrous sulfate 324 MG TBEC [id number] Yes Take 1 tablet by mouth daily with breakfast. [provider] Taking Active   metFORMIN (GLUCOPHAGE-XR) 500 MG 24 hr tablet [id number] Yes Take 1 tablet (500 mg total) by mouth daily with breakfast. Hali Marry, MD Taking Active   Multiple Vitamin (MULTIVITAMIN) capsule [id number] Yes Take 1 capsule by mouth daily. [provider] Taking Active   pantoprazole (PROTONIX) 40 MG tablet [id number] Yes TAKE ONE TABLET (40 MG TOTAL) BY MOUTH DAILY Hali Marry, MD Taking Active   simvastatin (ZOCOR) 40 MG tablet [id number] Yes TAKE ONE TABLET BY MOUTH DAILY Hali Marry, MD Taking Active             Patient Active Problem List   Diagnosis Date Noted   Acute bilateral low back pain without  sciatica 10/05/2020   DDD (degenerative disc disease), lumbar 10/05/2020   Iron deficiency anemia due to chronic blood loss 04/23/2020   Pancreatic lesion 04/23/2020   Weakness of both lower extremities 04/23/2020   H/O congenital atrial septal defect (ASD) repair 03/22/2020   BRBPR (bright red blood per rectum) 05/05/2019   Nocturnal hypoxia 06/11/2018   Aortic atherosclerosis ([place]) 03/30/2017   Hemoglobin low 03/04/2017   Scoliosis of thoracic spine 12/04/2016   Primary osteoarthritis of left shoulder 07/07/2016   Pseudophakia of both eyes 04/22/2016   Pain and swelling of lower extremity 12/31/2015   Arthrofibrosis of knee joint, left 02/01/2014   OSA  on CPAP 10/12/2013   PVD (peripheral vascular disease) ([place]) 03/31/2012   Morbid obesity ([place]) 07/03/2011   Diabetes mellitus type 2 in obese ([place]) 11/08/2010   ARTHRITIS, CERVICAL SPINE 05/08/2009   History of total knee arthroplasty, left 03/02/2008   Lumbar radiculopathy 12/29/2007   ANKLE EDEMA 12/29/2007   ESOPHAGEAL STRICTURE 08/18/2007   GERD 07/19/2007   HAIR LOSS 04/19/2007   History of total left hip arthroplasty 04/19/2007   Pulmonary hypertension ([place]) 12/17/2006   Scoliosis 12/17/2006   VALVULAR HEART DISEASE 08/14/2006   Hyperlipidemia 04/10/2006    Immunization History  Administered Date(s) Administered   Fluad Quad(high Dose 65+) 12/27/2018, 01/11/2020   Influenza Whole 04/02/2006, 04/19/2007   Influenza, High Dose Seasonal PF 12/24/2016, 02/18/2018   Influenza, Seasonal, Injecte, Preservative Fre 03/31/2012   Influenza,inj,Quad PF,6+ Mos 02/28/2013, 03/02/2014, 03/20/2015, 12/24/2015   PFIZER(Purple Top)SARS-COV-2 Vaccination 07/06/2019, 07/27/2019   Pneumococcal Conjugate-13 11/17/2014   Pneumococcal Polysaccharide-23 04/02/2006, 07/01/2012   Tdap 10/29/2010   Zoster, Live 12/23/2010    Conditions to be addressed/monitored: HLD and DMII  There are no care plans that you recently modified to display  for this patient.   Medication Assistance: None required.  Patient affirms current coverage meets needs.  Patient's preferred pharmacy is:  [place], [place] - [place] [address] [place] [address] Phone: [phone] Fax: [phone]  Uses pill box? No - puts medicine into one single bottle, works well for her she states Pt endorses 100% compliance  Follow Up:  Patient agrees to Care Plan and Follow-up.  Plan: Telephone follow up appointment with care management team member scheduled for:  6-8 months  Larinda Buttery, PharmD Clinical Pharmacist [facility] [phone]

## 2021-03-06 NOTE — Patient Instructions (Signed)
Visit Information   PATIENT GOALS/PLAN OF CARE:  Care Plan : Medication Management  Updates made by Darius Bump, [place] since 03/06/2021 12:00 AM     Problem: DM, HLD      Long-Range Goal: Disease Progression Prevention   This Visit's Progress: On track  Priority: High  Note:   Current Barriers:  None at present  Pharmacist Clinical Goal(s):  Over the next 180 days, patient will maintain control of chronic conditions as evidenced by medication fill history, lab values, and vital signs  through collaboration with PharmD and provider.   Interventions: 1:1 collaboration with Hali Marry, MD regarding development and update of comprehensive plan of care as evidenced by provider attestation and co-signature Inter-disciplinary care team collaboration (see longitudinal plan of care) Comprehensive medication review performed; medication list updated in electronic medical record  Diabetes:  Controlled; current treatment:metformin XR 561m daily; a1c 6.5  Current glucose readings:not currently checking  Denies hypoglycemic/hyperglycemic symptoms  Current meal patterns: breakfast: bowl of cereal, or muffin, or sausage biscuit, some little toast; lunch: skips; dinner: vegetables (not a fan of meats); snacks: peanuts; drinks: water, pepsi occasionally, tea occasionally  Current exercise: limited   Recommended continue current regimen Hyperlipidemia:  Uncontrolled/controlled; current treatment:simvastatin 498mdaily, fish oil daily; (last filled 2019?), LDL 102 in 2021. Pt states she received surplus last time, so has plenty.   Recommended obtain annual labs, continue current regimen.  Patient Goals/Self-Care Activities Over the next 180 days, patient will:  take medications as prescribed  Follow Up Plan: Telephone follow up appointment with care management team member scheduled for:  6-8 months      Consent to CCM Services: Ms. DuDegrasseas given information about Chronic  Care Management services including:  CCM service includes personalized support from designated clinical staff supervised by her physician, including individualized plan of care and coordination with other care providers 24/7 contact phone numbers for assistance for urgent and routine care needs. Service will only be billed when office clinical staff spend 20 minutes or more in a month to coordinate care. Only one practitioner may furnish and bill the service in a calendar month. The patient may stop CCM services at any time (effective at the end of the month) by phone call to the office staff. The patient will be responsible for cost sharing (co-pay) of up to 20% of the service fee (after annual deductible is met).  Patient agreed to services and verbal consent obtained.   The patient verbalized understanding of instructions, educational materials, and care plan provided today and agreed to receive a mailed copy of patient instructions, educational materials, and care plan.   Telephone follow up appointment with care management team member scheduled for: 6-8 months  KeDarius Bump

## 2021-03-07 NOTE — Progress Notes (Signed)
Call patient: Hemoglobin looks fantastic at 13.  Kidney function is stable.  Liver enzymes are normal.  Cholesterol is just borderline but okay.  B12 looks good.

## 2021-03-27 ENCOUNTER — Other Ambulatory Visit: Payer: Self-pay | Admitting: Family Medicine

## 2021-04-03 DIAGNOSIS — E669 Obesity, unspecified: Secondary | ICD-10-CM

## 2021-04-03 DIAGNOSIS — E7800 Pure hypercholesterolemia, unspecified: Secondary | ICD-10-CM

## 2021-04-03 DIAGNOSIS — E1169 Type 2 diabetes mellitus with other specified complication: Secondary | ICD-10-CM

## 2021-04-15 ENCOUNTER — Other Ambulatory Visit: Payer: Self-pay | Admitting: Family Medicine

## 2021-05-17 ENCOUNTER — Other Ambulatory Visit: Payer: Self-pay | Admitting: Family Medicine

## 2021-06-12 ENCOUNTER — Other Ambulatory Visit: Payer: Self-pay

## 2021-06-12 ENCOUNTER — Other Ambulatory Visit: Payer: Self-pay | Admitting: Family Medicine

## 2021-06-20 ENCOUNTER — Other Ambulatory Visit: Payer: Self-pay

## 2021-06-20 ENCOUNTER — Ambulatory Visit (INDEPENDENT_AMBULATORY_CARE_PROVIDER_SITE_OTHER): Payer: Medicare PPO | Admitting: Family Medicine

## 2021-06-20 ENCOUNTER — Encounter: Payer: Self-pay | Admitting: Family Medicine

## 2021-06-20 VITALS — BP 140/61 | HR 92 | Resp 18 | Ht 64.0 in | Wt 196.0 lb

## 2021-06-20 DIAGNOSIS — E669 Obesity, unspecified: Secondary | ICD-10-CM | POA: Diagnosis not present

## 2021-06-20 DIAGNOSIS — I872 Venous insufficiency (chronic) (peripheral): Secondary | ICD-10-CM | POA: Insufficient documentation

## 2021-06-20 DIAGNOSIS — K869 Disease of pancreas, unspecified: Secondary | ICD-10-CM

## 2021-06-20 DIAGNOSIS — E1169 Type 2 diabetes mellitus with other specified complication: Secondary | ICD-10-CM | POA: Diagnosis not present

## 2021-06-20 DIAGNOSIS — K625 Hemorrhage of anus and rectum: Secondary | ICD-10-CM

## 2021-06-20 DIAGNOSIS — G4733 Obstructive sleep apnea (adult) (pediatric): Secondary | ICD-10-CM

## 2021-06-20 DIAGNOSIS — Z23 Encounter for immunization: Secondary | ICD-10-CM | POA: Diagnosis not present

## 2021-06-20 DIAGNOSIS — Z9989 Dependence on other enabling machines and devices: Secondary | ICD-10-CM | POA: Diagnosis not present

## 2021-06-20 LAB — POCT GLYCOSYLATED HEMOGLOBIN (HGB A1C): Hemoglobin A1C: 6.1 % — AB (ref 4.0–5.6)

## 2021-06-20 MED ORDER — CHLORTHALIDONE 25 MG PO TABS: 25.0000 mg | ORAL_TABLET | Freq: Every day | ORAL | 0 refills | Status: DC | PRN

## 2021-06-20 MED ORDER — PANTOPRAZOLE SODIUM 40 MG PO TBEC: DELAYED_RELEASE_TABLET | ORAL | 3 refills | Status: DC

## 2021-06-20 MED ORDER — SIMVASTATIN 40 MG PO TABS: 40.0000 mg | ORAL_TABLET | Freq: Every day | ORAL | 3 refills | Status: DC

## 2021-06-20 MED ORDER — METFORMIN HCL ER 500 MG PO TB24: ORAL_TABLET | ORAL | 3 refills | Status: DC

## 2021-06-20 NOTE — Assessment & Plan Note (Signed)
For pancreatic lesion she had an EGD with EUS biopsy performed in January 2022.  Negative for malignancy.  They recommended no additional follow-up.

## 2021-06-20 NOTE — Assessment & Plan Note (Signed)
A1c looks great today at 6.1.  Continue current regimen.  Follow-up in 4 months.

## 2021-06-20 NOTE — Progress Notes (Signed)
Established Patient Office Visit  Subjective:  Patient ID: Shelby Thomas, female    DOB: [birth year]  Age: 76 y.o. MRN: [id number]  CC:  Chief Complaint  Patient presents with   Diabetes    Follow up    Leg Swelling    Bilateral, burning sensations in legs for several months. Would like to discuss imaging   Discuss Diet/Foods to help decrease rectal bleeding    Patient stated she notices rectal bleeding in eating Peanuts and would like to discuss what she can eat that does not cause her to have rectal bleeding.      HPI Shelby Thomas presents for   Diabetes - no hypoglycemic events. No wounds or sores that are not healing well. No increased thirst or urination. Checking glucose at home. Taking medications as prescribed without any side effects.   Patient stated she notices rectal bleeding in eating Peanuts and would like to discuss what she can eat that does not cause her to have rectal bleeding.  Bilateral swelling and burning sensations in legs for several months. Would like to discuss imaging.  She still has some darker pigmentation which she had mentioned back at the end of the summer but she had also not been wearing her compression stockings she said she went to go get measured for some over-the-counter ones and they told her that she was in between sizes.  At that time I offered to refer her for ABIs but she said she would just try to start wearing her compression stockings again.  For pancreatic lesion she had an EGD with EUS biopsy performed in January 2022.  Negative for malignancy.  They recommended no additional follow-up.  Past Medical History:  Diagnosis Date   CHF (congestive heart failure) (HCC)    mild   Diabetes mellitus (HCC)    Foraminal stenosis of lumbar region    L4--L5   GERD (gastroesophageal reflux disease)    Heart disease, congenital    large ASD, Sees Dr. Zollie Beckers   Hiatal hernia    Hyperlipidemia    dyslipidemia   Hypertension     pulmonary/on nighttime O2   [facility] association    moderate to severe   OSA (obstructive sleep apnea)    Pneumonia 2006   Scoliosis    severe   Spine misalignment    T spine 80% to right, L-Spine 55% to left    Past Surgical History:  Procedure Laterality Date   CARDIAC SURGERY     repair of ASD   ESOPHAGEAL DILATION  04-09   dilation/Dr. Mathews Robinsons   REPLACEMENT TOTAL KNEE Left 12/08/13   Dr. Williams Che   TOTAL HIP ARTHROPLASTY Left     Family History  Problem Relation Age of Onset   Diabetes Mother    Stroke Mother    CAD Mother        CABG in her 62s    Social History   Socioeconomic History   Marital status: Married    Spouse name: Jamse Belfast   Number of children: 2   Years of education: 14.5   Highest education level: Some college, no degree  Occupational History    Comment: Retired.  Tobacco Use   Smoking status: Never   Smokeless tobacco: Never  Vaping Use   Vaping Use: Never used  Substance and Sexual Activity   Alcohol use: No   Drug use: Never   Sexual activity: Not on file  Other Topics Concern   Not on  file  Social History Narrative   Lives at home with Brenton. She is retired. She works on her tablet and does puzzles.   Social Determinants of Health   Financial Resource Strain: Low Risk    Difficulty of Paying Living Expenses: Not hard at all  Food Insecurity: No Food Insecurity   Worried About Charity fundraiser in the Last Year: Never true   [place] in the Last Year: Never true  Transportation Needs: No Transportation Needs   Lack of Transportation (Medical): No   Lack of Transportation (Non-Medical): No  Physical Activity: Inactive   Days of Exercise per Week: 0 days   Minutes of Exercise per Session: 0 min  Stress: No Stress Concern Present   Feeling of Stress : Not at all  Social Connections: Moderately Integrated   Frequency of Communication with Friends and Family: More than three times a week   Frequency of Social  Gatherings with Friends and Family: Once a week   Attends Religious Services: More than 4 times per year   Active Member of [employer] or Organizations: No   Attends Music therapist: Never   Marital Status: Married  Human resources officer Violence: Not At Risk   Fear of Current or Ex-Partner: No   Emotionally Abused: No   Physically Abused: No   Sexually Abused: No    Outpatient Medications Prior to Visit  Medication Sig Dispense Refill   AMBULATORY NON FORMULARY MEDICATION Medication Name: Oxygen with CPAP qhs at 2L/min. Diagnosis: Pulmonary Hypertension, Dyspnea on Exertion 1 Units prn   ascorbic acid (VITAMIN C) 250 MG CHEW Chew 250 mg by mouth daily.     calcium citrate (CALCITRATE - DOSED IN MG ELEMENTAL CALCIUM) 950 (200 Ca) MG tablet Take 200 mg of elemental calcium by mouth daily.     diclofenac Sodium (VOLTAREN) 1 % GEL Apply 2 g topically 4 (four) times daily. Up to 4 joints 350 g 1   ferrous sulfate 324 MG TBEC Take 1 tablet by mouth daily with breakfast.     Multiple Vitamin (MULTIVITAMIN) capsule Take 1 capsule by mouth daily.     Omega-3 Fatty Acids (FISH OIL) 1000 MG CAPS Take 1,000 mg by mouth daily.     metFORMIN (GLUCOPHAGE-XR) 500 MG 24 hr tablet TAKE ONE TABLET (500MG TOTAL) BY MOUTH DAILY WITH BREAKFAST 90 tablet 0   pantoprazole (PROTONIX) 40 MG tablet TAKE ONE TABLET (40 MG TOTAL) BY MOUTH DAILY 90 tablet 0   simvastatin (ZOCOR) 40 MG tablet TAKE ONE TABLET BY MOUTH DAILY 90 tablet 3   AMBULATORY NON FORMULARY MEDICATION Medication Name: Free Style [employer] test strips.  Use daily as directed (Patient not taking: Reported on 03/06/2021) 50 strip 12   No facility-administered medications prior to visit.    Allergies  Allergen Reactions   Ramipril     REACTION: cough   Tramadol Other (See Comments)    Nausea    ROS Review of Systems    Objective:    Physical Exam Constitutional:      Appearance: Normal appearance. She is well-developed.  HENT:      Head: Normocephalic and atraumatic.  Cardiovascular:     Rate and Rhythm: Normal rate and regular rhythm.     Heart sounds: Normal heart sounds.  Pulmonary:     Effort: Pulmonary effort is normal.     Breath sounds: Wheezing present.     Comments: Slight expiratory wheeze on the right upper lung. She says  she feels fine today. No chest sxs.  Skin:    General: Skin is warm and dry.     Comments: Darker skin pigmentation of her right lower leg and foot compared to her right.  She has very thickened skin and 1+ pitting edema bilaterally.  No open wound or drainage.  Unable to palpate dorsal pedal pulse on the left foot.  Trace pedal pulse on the right  Neurological:     Mental Status: She is alert and oriented to person, place, and time.  Psychiatric:        Behavior: Behavior normal.   BP 140/61    Pulse 92    Resp 18    Ht '5\' 4"'  (1.626 m)    Wt 196 lb (88.9 kg)    SpO2 96%    BMI 33.64 kg/m  Wt Readings from Last 3 Encounters:  06/20/21 196 lb (88.9 kg)  11/19/20 198 lb (89.8 kg)  10/03/20 198 lb (89.8 kg)     Health Maintenance Due  Topic Date Due   Zoster Vaccines- Shingrix (1 of 2) Never done   TETANUS/TDAP  10/28/2020    There are no preventive care reminders to display for this patient.  Lab Results  Component Value Date   TSH 0.78 11/25/2017   Lab Results  Component Value Date   WBC 4.3 03/06/2021   HGB 13.8 03/06/2021   HCT 42.1 03/06/2021   MCV 100.0 03/06/2021   PLT 246 03/06/2021   Lab Results  Component Value Date   NA 146 03/06/2021   K 4.7 03/06/2021   CO2 33 (H) 03/06/2021   GLUCOSE 122 (H) 03/06/2021   BUN 8 03/06/2021   CREATININE 0.67 03/06/2021   BILITOT 0.5 03/06/2021   ALKPHOS 90 10/14/2016   AST 19 03/06/2021   ALT 16 03/06/2021   PROT 7.2 03/06/2021   ALBUMIN 3.7 10/14/2016   CALCIUM 9.6 03/06/2021   EGFR 92 03/06/2021   Lab Results  Component Value Date   CHOL 197 03/06/2021   Lab Results  Component Value Date   HDL 74  03/06/2021   Lab Results  Component Value Date   LDLCALC 104 (H) 03/06/2021   Lab Results  Component Value Date   TRIG 92 03/06/2021   Lab Results  Component Value Date   CHOLHDL 2.7 03/06/2021   Lab Results  Component Value Date   HGBA1C 6.1 (A) 06/20/2021      Assessment & Plan:   Problem List Items Addressed This Visit       Respiratory   OSA on CPAP     Digestive   Pancreatic lesion    For pancreatic lesion she had an EGD with EUS biopsy performed in January 2022.  Negative for malignancy.  They recommended no additional follow-up.      BRBPR (bright red blood per rectum)    She reports that she been eating a lot of peanuts and started to notice some rectal bleeding.  She quit eating them and says the bleeding stopped abruptly and she has not had any since then.  She is not on any blood thinners currently.  We did discuss that if it occurs again then we really need to refer her back to GI.  She is on pantoprazole.  And I do not know why nuts would cause bleeding and less she is just not digesting them well.        Endocrine   Diabetes mellitus type 2 in obese [facility]) - Primary  A1c looks great today at 6.1.  Continue current regimen.  Follow-up in 4 months.      Relevant Medications   metFORMIN (GLUCOPHAGE-XR) 500 MG 24 hr tablet   simvastatin (ZOCOR) 40 MG tablet   Other Relevant Orders   POCT HgB A1C (Completed)     Musculoskeletal and Integument   Venous stasis dermatitis of both lower extremities   Relevant Orders   VAS [place] ABI WITH/WO TBI   Other Visit Diagnoses     Need for immunization against influenza       Relevant Orders   Flu Vaccine QUAD High Dose(Fluad) (Completed)       Venous stasis and venous stasis dermatitis-the swelling is always better in the mornings and gets worse as the day goes on.  Encouraged her to find a compression stocking that is pretty close.  She does not have abnormally shaped legs.  So I think something  over-the-counter would be sufficient especially if she is getting something with a 15-20 pressure.  We can always still move forward with the ABIs.  But it sounds consistent with venous stasis that she does have a little bit more dark pigmentation on that left lower leg and foot compared to her right.  Flu shot given today.     Meds ordered this encounter  Medications   metFORMIN (GLUCOPHAGE-XR) 500 MG 24 hr tablet    Sig: TAKE ONE TABLET (500MG TOTAL) BY MOUTH DAILY WITH BREAKFAST    Dispense:  90 tablet    Refill:  3   simvastatin (ZOCOR) 40 MG tablet    Sig: Take 1 tablet (40 mg total) by mouth daily.    Dispense:  90 tablet    Refill:  3   pantoprazole (PROTONIX) 40 MG tablet    Sig: TAKE ONE TABLET (40 MG TOTAL) BY MOUTH DAILY    Dispense:  90 tablet    Refill:  3   chlorthalidone (HYGROTON) 25 MG tablet    Sig: Take 1 tablet (25 mg total) by mouth daily as needed. For swelling in legs    Dispense:  30 tablet    Refill:  0    Follow-up: Return in about 4 months (around 10/18/2021) for Diabetes follow-up.    Beatrice Lecher, MD

## 2021-06-20 NOTE — Patient Instructions (Signed)
Commend an over-the-counter compression stocking.  I think you would do well with a 15 to 20 mmHg compression.  I did send over prescription for chlorthalidone which is a fluid pill to just take as needed if they get really swollen.  Just keep track of how often you are actually taking them.

## 2021-06-20 NOTE — Assessment & Plan Note (Signed)
She reports that she been eating a lot of peanuts and started to notice some rectal bleeding.  She quit eating them and says the bleeding stopped abruptly and she has not had any since then.  She is not on any blood thinners currently.  We did discuss that if it occurs again then we really need to refer her back to GI.  She is on pantoprazole.  And I do not know why nuts would cause bleeding and less she is just not digesting them well.

## 2021-08-05 ENCOUNTER — Ambulatory Visit (INDEPENDENT_AMBULATORY_CARE_PROVIDER_SITE_OTHER): Payer: Medicare PPO | Admitting: Physician Assistant

## 2021-08-05 DIAGNOSIS — Z0000 Encounter for general adult medical examination without abnormal findings: Secondary | ICD-10-CM

## 2021-08-05 NOTE — Patient Instructions (Addendum)
?MEDICARE ANNUAL WELLNESS VISIT ?Health Maintenance Summary and Written Plan of Care ? ?[name] , ? ?Thank you for allowing me to perform your Medicare Annual Wellness Visit and for your ongoing commitment to your health.  ? ?Health Maintenance & Immunization History ?Health Maintenance  ?Topic Date Due  ?? COVID-19 Vaccine (3 - Booster for Pfizer series) 08/21/2021 (Originally 09/21/2019)  ?? Zoster Vaccines- Shingrix (1 of 2) 11/04/2021 (Originally 02/27/1997)  ?? TETANUS/TDAP  08/06/2022 (Originally 10/28/2020)  ?? URINE MICROALBUMIN  11/19/2021  ?? INFLUENZA VACCINE  12/03/2021  ?? HEMOGLOBIN A1C  12/18/2021  ?? OPHTHALMOLOGY EXAM  01/02/2022  ?? FOOT EXAM  06/20/2022  ?? MAMMOGRAM  08/16/2022  ?? COLONOSCOPY (Pts 45-56yrs Insurance coverage will need to be confirmed)  02/17/2024  ?? Pneumonia Vaccine 65+ Years old  Completed  ?? DEXA SCAN  Completed  ?? Hepatitis C Screening  Completed  ?? HPV VACCINES  Aged Out  ? ?Immunization History  ?Administered Date(s) Administered  ?? Fluad Quad(high Dose 65+) 12/27/2018, 01/11/2020, 06/20/2021  ?? Influenza Whole 04/02/2006, 04/19/2007  ?? Influenza, High Dose Seasonal PF 12/24/2016, 02/18/2018  ?? Influenza, Seasonal, Injecte, Preservative Fre 03/31/2012  ?? Influenza,inj,Quad PF,6+ Mos 02/28/2013, 03/02/2014, 03/20/2015, 12/24/2015  ?? PFIZER(Purple Top)SARS-COV-2 Vaccination 07/06/2019, 07/27/2019  ?? Pneumococcal Conjugate-13 11/17/2014  ?? Pneumococcal Polysaccharide-23 04/02/2006, 07/01/2012  ?? Tdap 10/29/2010  ?? Zoster, Live 12/23/2010  ? ? ?These are the patient goals that we discussed: ? Goals Addressed   ?  ?  ?  ?  ?  ? This Visit's Progress  ? ?  Patient Stated (pt-stated)     ?   08/05/2021 ?AWV Goal: Exercise for General Health ? ?Patient will verbalize understanding of the benefits of increased physical activity: ?Exercising regularly is important. It will improve your overall fitness, flexibility, and endurance. ?Regular exercise also will improve  your overall health. It can help you control your weight, reduce stress, and improve your bone density. ?Over the next year, patient will increase physical activity as tolerated with a goal of at least 150 minutes of moderate physical activity per week.  ?You can tell that you are exercising at a moderate intensity if your heart starts beating faster and you start breathing faster but can still hold a conversation. ?Moderate-intensity exercise ideas include: ?Walking 1 mile (1.6 km) in about 15 minutes ?Biking ?Hiking ?Golfing ?Dancing ?Water aerobics ?Patient will verbalize understanding of everyday activities that increase physical activity by providing examples like the following: ?Yard work, such as: ?Pushing a Surveyor, mining ?Raking and bagging leaves ?Washing your car ?Pushing a stroller ?Shoveling snow ?Gardening ?Washing windows or floors ?Patient will be able to explain general safety guidelines for exercising:  ?Before you start a new exercise program, talk with your health care provider. ?Do not exercise so much that you hurt yourself, feel dizzy, or get very short of breath. ?Wear comfortable clothes and wear shoes with good support. ?Drink plenty of water while you exercise to prevent dehydration or heat stroke. ?Work out until your breathing and your heartbeat get faster.  ?  ?  ?  ? ?This is a list of Health Maintenance Items that are overdue or due now: ?Td vaccine ?Shingrix vaccine ?Mammogram - scheduled at [facility] in [facility] April.  ? ?Orders/Referrals Placed Today: ?No orders of the defined types were placed in this encounter. ? ?(Contact our referral department at [phone] if you have not spoken with someone about your referral appointment within the next 5 days)  ? ? ?Follow-up  Plan ?Follow-up with Agapito Games, MD as planned ?Schedule your tetanus shot and shingrix vaccine at your pharmacy. ?Medicare wellness in one year.  ?AVS mailed to the patient. ? ? ? ?  ?Health Maintenance,  Female ?Adopting a healthy lifestyle and getting preventive care are important in promoting health and wellness. Ask your health care provider about: ?The right schedule for you to have regular tests and exams. ?Things you can do on your own to prevent diseases and keep yourself healthy. ?What should I know about diet, weight, and exercise? ?Eat a healthy diet ? ?Eat a diet that includes plenty of vegetables, fruits, low-fat dairy products, and lean protein. ?Do not eat a lot of foods that are high in solid fats, added sugars, or sodium. ?Maintain a healthy weight ?Body mass index (BMI) is used to identify weight problems. It estimates body fat based on height and weight. Your health care provider can help determine your BMI and help you achieve or maintain a healthy weight. ?Get regular exercise ?Get regular exercise. This is one of the most important things you can do for your health. Most adults should: ?Exercise for at least 150 minutes each week. The exercise should increase your heart rate and make you sweat (moderate-intensity exercise). ?Do strengthening exercises at least twice a week. This is in addition to the moderate-intensity exercise. ?Spend less time sitting. Even light physical activity can be beneficial. ?Watch cholesterol and blood lipids ?Have your blood tested for lipids and cholesterol at 75 years of age, then have this test every 5 years. ?Have your cholesterol levels checked more often if: ?Your lipid or cholesterol levels are high. ?You are older than 75 years of age. ?You are at high risk for heart disease. ?What should I know about cancer screening? ?Depending on your health history and family history, you may need to have cancer screening at various ages. This may include screening for: ?Breast cancer. ?Cervical cancer. ?Colorectal cancer. ?Skin cancer. ?Lung cancer. ?What should I know about heart disease, diabetes, and high blood pressure? ?Blood pressure and heart disease ?High blood  pressure causes heart disease and increases the risk of stroke. This is more likely to develop in people who have high blood pressure readings or are overweight. ?Have your blood pressure checked: ?Every 3-5 years if you are 53-76 years of age. ?Every year if you are 41 years old or older. ?Diabetes ?Have regular diabetes screenings. This checks your fasting blood sugar level. Have the screening done: ?Once every three years after age 32 if you are at a normal weight and have a low risk for diabetes. ?More often and at a younger age if you are overweight or have a high risk for diabetes. ?What should I know about preventing infection? ?Hepatitis B ?If you have a higher risk for hepatitis B, you should be screened for this virus. Talk with your health care provider to find out if you are at risk for hepatitis B infection. ?Hepatitis C ?Testing is recommended for: ?Everyone born from [birth year] through 1964. ?Anyone with known risk factors for hepatitis C. ?Sexually transmitted infections (STIs) ?Get screened for STIs, including gonorrhea and chlamydia, if: ?You are sexually active and are younger than 75 years of age. ?You are older than 75 years of age and your health care provider tells you that you are at risk for this type of infection. ?Your sexual activity has changed since you were last screened, and you are at increased risk for chlamydia or gonorrhea. Ask  your health care provider if you are at risk. ?Ask your health care provider about whether you are at high risk for HIV. Your health care provider may recommend a prescription medicine to help prevent HIV infection. If you choose to take medicine to prevent HIV, you should first get tested for HIV. You should then be tested every 3 months for as long as you are taking the medicine. ?Pregnancy ?If you are about to stop having your period (premenopausal) and you may become pregnant, seek counseling before you get pregnant. ?Take 400 to 800 micrograms (mcg) of folic  acid every day if you become pregnant. ?Ask for birth control (contraception) if you want to prevent pregnancy. ?Osteoporosis and menopause ?Osteoporosis is a disease in which the bones lose minerals and strength

## 2021-08-05 NOTE — Progress Notes (Signed)
? ? ?MEDICARE ANNUAL WELLNESS VISIT ? ?08/05/2021 ? ?Telephone Visit Disclaimer ?This Medicare AWV was conducted by telephone due to national recommendations for restrictions regarding the COVID-19 Pandemic (e.g. social distancing).  I verified, using two identifiers, that I am speaking with Shelby Peteroris A Jordan or their authorized healthcare agent. I discussed the limitations, risks, security, and privacy concerns of performing an evaluation and management service by telephone and the potential availability of an in-person appointment in the future. The patient expressed understanding and agreed to proceed.  ?Location of Patient: Home ?Location of Provider (nurse):  Provider home ? ?Subjective:  ? ? ?Shelby PeterDoris A Thomas is a 76 y.o. female patient of Metheney, Barbarann Ehlersatherine D, MD who had a Medicare Annual Wellness Visit today via telephone. Shelby AasDoris is Retired and lives with their spouse. she has 2 children. she reports that she is socially active and does interact with friends/family regularly. she is minimally physically active and enjoys tablet and does puzzles.. ? ?Patient Care Team: ?Agapito GamesMetheney, Catherine D, MD as PCP - General (Family Medicine) ?Early, Sung AmabileSara E, NP as Nurse Practitioner (Nurse Practitioner) ?Gabriel CarinaKline, Keesha J, [facility] as Pharmacist (Pharmacist) ? ? ?  08/05/2021  ?  3:24 PM 07/23/2020  ? 11:02 AM 03/02/2014  ? 11:20 AM 07/29/2013  ? 11:00 AM  ?Advanced Directives  ?Does Patient Have a Medical Advance Directive? No No No Patient does not have advance directive;Patient would like information  ?Would patient like information on creating a medical advance directive? No - Patient declined No - Patient declined No - patient declined information Advance directive packet given  ? ? ?Hospital Utilization Over the Past 12 Months: ?# of hospitalizations or ER visits: 0 ?# of surgeries: 0 ? ?Review of Systems    ?Patient reports that her overall health is unchanged compared to last year. ? ?History obtained from chart review and the  patient ? ?Patient Reported Readings (BP, Pulse, CBG, Weight, etc) ?none ? ?Pain Assessment ?Pain : No/denies pain ? ?  ? ?Current Medications & Allergies (verified) ?Allergies as of 08/05/2021   ? ?   Reactions  ? Ramipril   ? REACTION: cough  ? Tramadol Other (See Comments)  ? Nausea  ? ?  ? ?  ?Medication List  ?  ? ?  ? Accurate as of August 05, 2021  3:40 PM. If you have any questions, ask your nurse or doctor.  ?  ?  ? ?  ? ?ascorbic acid 250 MG Chew ?Commonly known as: VITAMIN C ?Chew 250 mg by mouth daily. ?  ?calcium citrate 950 (200 Ca) MG tablet ?Commonly known as: CALCITRATE - dosed in mg elemental calcium ?Take 200 mg of elemental calcium by mouth daily. ?  ?chlorthalidone 25 MG tablet ?Commonly known as: HYGROTON ?Take 1 tablet (25 mg total) by mouth daily as needed. For swelling in legs ?  ?diclofenac Sodium 1 % Gel ?Commonly known as: Voltaren ?Apply 2 g topically 4 (four) times daily. Up to 4 joints ?  ?ferrous sulfate 324 MG Tbec ?Take 1 tablet by mouth daily with breakfast. ?  ?Fish Oil 1000 MG Caps ?Take 1,000 mg by mouth daily. ?  ?metFORMIN 500 MG 24 hr tablet ?Commonly known as: GLUCOPHAGE-XR ?TAKE ONE TABLET (500MG  TOTAL) BY MOUTH DAILY WITH BREAKFAST ?  ?multivitamin capsule ?Take 1 capsule by mouth daily. ?  ?pantoprazole 40 MG tablet ?Commonly known as: PROTONIX ?TAKE ONE TABLET (40 MG TOTAL) BY MOUTH DAILY ?  ?simvastatin 40 MG tablet ?Commonly known as: ZOCOR ?  Take 1 tablet (40 mg total) by mouth daily. ?  ? ?  ? ? ?History (reviewed): ?Past Medical History:  ?Diagnosis Date  ? CHF (congestive heart failure) (HCC)   ? mild  ? Diabetes mellitus (HCC)   ? Foraminal stenosis of lumbar region   ? L4--L5  ? GERD (gastroesophageal reflux disease)   ? Heart disease, congenital   ? large ASD, Sees Dr. Elmarie Shiley  ? Hiatal hernia   ? Hyperlipidemia   ? dyslipidemia  ? Hypertension   ? pulmonary/on nighttime O2  ? [employer] association   ? moderate to severe  ? OSA (obstructive sleep apnea)   ?  Pneumonia 2006  ? Scoliosis   ? severe  ? Spine misalignment   ? T spine 80% to right, L-Spine 55% to left  ? ?Past Surgical History:  ?Procedure Laterality Date  ? CARDIAC SURGERY    ? repair of ASD  ? ESOPHAGEAL DILATION  04-09  ? dilation/Dr. Inge Rise  ? REPLACEMENT TOTAL KNEE Left 12/08/13  ? Dr. Dossie Arbour  ? TOTAL HIP ARTHROPLASTY Left   ? ?Family History  ?Problem Relation Age of Onset  ? Diabetes Mother   ? Stroke Mother   ? CAD Mother   ?     CABG in her 59s  ? ?Social History  ? ?Socioeconomic History  ? Marital status: Married  ?  Spouse name: Ian Bushman  ? Number of children: 2  ? Years of education: 14.5  ? Highest education level: Some college, no degree  ?Occupational History  ?  Comment: Retired.  ?Tobacco Use  ? Smoking status: Never  ? Smokeless tobacco: Never  ?Vaping Use  ? Vaping Use: Never used  ?Substance and Sexual Activity  ? Alcohol use: No  ? Drug use: Never  ? Sexual activity: Not on file  ?Other Topics Concern  ? Not on file  ?Social History Narrative  ? Lives at home with Ian Bushman. She is retired. She works on her tablet and does puzzles.  ? ?Social Determinants of Health  ? ?Financial Resource Strain: Low Risk   ? Difficulty of Paying Living Expenses: Not hard at all  ?Food Insecurity: No Food Insecurity  ? Worried About Programme researcher, broadcasting/film/video in the Last Year: Never true  ? Ran Out of Food in the Last Year: Never true  ?Transportation Needs: No Transportation Needs  ? Lack of Transportation (Medical): No  ? Lack of Transportation (Non-Medical): No  ?Physical Activity: Inactive  ? Days of Exercise per Week: 0 days  ? Minutes of Exercise per Session: 0 min  ?Stress: No Stress Concern Present  ? Feeling of Stress : Not at all  ?Social Connections: Moderately Integrated  ? Frequency of Communication with Friends and Family: More than three times a week  ? Frequency of Social Gatherings with Friends and Family: Once a week  ? Attends Religious Services: More than 4 times per year  ? Active  Member of Clubs or Organizations: No  ? Attends Banker Meetings: Never  ? Marital Status: Married  ? ? ?Activities of Daily Living ? ?  08/05/2021  ?  3:29 PM  ?In your present state of health, do you have any difficulty performing the following activities:  ?Hearing? 0  ?Vision? 0  ?Difficulty concentrating or making decisions? 0  ?Walking or climbing stairs? 1  ?Comment she had knee surgery and since then she has had difficulty walking.  ?Dressing or bathing? 0  ?Doing errands,  shopping? 0  ?Preparing Food and eating ? N  ?Using the Toilet? N  ?In the past six months, have you accidently leaked urine? N  ?Do you have problems with loss of bowel control? N  ?Managing your Medications? N  ?Managing your Finances? N  ?Housekeeping or managing your Housekeeping? N  ? ? ?Patient Education/ Literacy ?How often do you need to have someone help you when you read instructions, pamphlets, or other written materials from your doctor or pharmacy?: 1 - Never ?What is the last grade level you completed in school?: 2.5 years of college ? ?Exercise ?Current Exercise Habits: The patient does not participate in regular exercise at present, Exercise limited by: orthopedic condition(s) ? ?Diet ?Patient reports consuming  2-3  meals a day and 2 snack(s) a day ?Patient reports that her primary diet is: Regular ?Patient reports that she does have regular access to food.  ? ?Depression Screen ? ?  08/05/2021  ?  3:25 PM 06/20/2021  ? 10:53 AM 11/19/2020  ? 11:16 AM 07/23/2020  ? 11:03 AM 12/21/2019  ? 11:36 AM 12/27/2018  ? 10:48 AM 08/26/2018  ?  8:45 AM  ?PHQ 2/9 Scores  ?PHQ - 2 Score 0 0 0 0 6 0 0  ?PHQ- 9 Score     27    ?  ? ?Fall Risk ? ?  08/05/2021  ?  3:24 PM 06/20/2021  ? 10:53 AM 11/19/2020  ? 11:15 AM 07/23/2020  ? 11:03 AM 03/22/2020  ? 11:10 AM  ?Fall Risk   ?Falls in the past year? 1 1  0 0  ?Number falls in past yr: 0 0 0 0   ?Injury with Fall? 0 0 0 0   ?Risk for fall due to : Impaired mobility;Impaired balance/gait  Impaired mobility;Impaired balance/gait Impaired mobility;No Fall Risks No Fall Risks Impaired mobility  ?Follow up Falls evaluation completed Falls prevention discussed;Falls evaluation completed Falls prevention d

## 2021-08-26 DIAGNOSIS — E119 Type 2 diabetes mellitus without complications: Secondary | ICD-10-CM | POA: Diagnosis not present

## 2021-09-06 DIAGNOSIS — H903 Sensorineural hearing loss, bilateral: Secondary | ICD-10-CM | POA: Diagnosis not present

## 2021-09-17 ENCOUNTER — Other Ambulatory Visit: Payer: Self-pay | Admitting: *Deleted

## 2021-09-17 DIAGNOSIS — G4733 Obstructive sleep apnea (adult) (pediatric): Secondary | ICD-10-CM

## 2021-09-17 DIAGNOSIS — G4734 Idiopathic sleep related nonobstructive alveolar hypoventilation: Secondary | ICD-10-CM

## 2021-09-17 DIAGNOSIS — I272 Pulmonary hypertension, unspecified: Secondary | ICD-10-CM

## 2021-09-17 MED ORDER — AMBULATORY NON FORMULARY MEDICATION: 99 refills | Status: AC

## 2021-10-18 ENCOUNTER — Telehealth: Payer: Self-pay | Admitting: *Deleted

## 2021-10-18 NOTE — Telephone Encounter (Signed)
Pt called and said that we called her and she missed the call. I don't have any notes from the clinical side of someone calling her.   Please call pt, thanks!

## 2021-10-18 NOTE — Telephone Encounter (Signed)
Spoke with patient to ask her if she received a voicemail from anyone if they left their name. She did not. Not sure who called patient or why, reminded her of upcoming appt on 10/23/21. AMUCK

## 2021-10-23 ENCOUNTER — Telehealth: Payer: Self-pay | Admitting: Physician Assistant

## 2021-10-23 ENCOUNTER — Ambulatory Visit: Payer: Medicare PPO | Admitting: Family Medicine

## 2021-10-23 ENCOUNTER — Encounter: Payer: Self-pay | Admitting: Family Medicine

## 2021-10-23 VITALS — BP 137/49 | HR 92 | Resp 18 | Ht 64.0 in | Wt 214.0 lb

## 2021-10-23 DIAGNOSIS — R0602 Shortness of breath: Secondary | ICD-10-CM

## 2021-10-23 DIAGNOSIS — E669 Obesity, unspecified: Secondary | ICD-10-CM | POA: Diagnosis not present

## 2021-10-23 DIAGNOSIS — M7989 Other specified soft tissue disorders: Secondary | ICD-10-CM | POA: Diagnosis not present

## 2021-10-23 DIAGNOSIS — R600 Localized edema: Secondary | ICD-10-CM

## 2021-10-23 DIAGNOSIS — I872 Venous insufficiency (chronic) (peripheral): Secondary | ICD-10-CM

## 2021-10-23 DIAGNOSIS — E1169 Type 2 diabetes mellitus with other specified complication: Secondary | ICD-10-CM

## 2021-10-23 DIAGNOSIS — M79606 Pain in leg, unspecified: Secondary | ICD-10-CM | POA: Diagnosis not present

## 2021-10-23 DIAGNOSIS — D539 Nutritional anemia, unspecified: Secondary | ICD-10-CM

## 2021-10-23 DIAGNOSIS — R7989 Other specified abnormal findings of blood chemistry: Secondary | ICD-10-CM

## 2021-10-23 DIAGNOSIS — R200 Anesthesia of skin: Secondary | ICD-10-CM | POA: Diagnosis not present

## 2021-10-23 LAB — POCT GLYCOSYLATED HEMOGLOBIN (HGB A1C): Hemoglobin A1C: 9.4 % — AB (ref 4.0–5.6)

## 2021-10-23 LAB — POCT UA - MICROALBUMIN

## 2021-10-23 MED ORDER — BLOOD GLUCOSE METER KIT: PACK | 0 refills | Status: AC

## 2021-10-23 MED ORDER — FUROSEMIDE 20 MG PO TABS: 20.0000 mg | ORAL_TABLET | Freq: Every day | ORAL | 0 refills | Status: DC | PRN

## 2021-10-23 NOTE — Progress Notes (Signed)
Acute Office Visit  Subjective:     Patient ID: Shelby Thomas, female    DOB: [birth year], 76 y.o.   MRN: [id number]  Chief Complaint  Patient presents with   Leg Pain    Patient complains of pain in legs, swelling, bumps and feeling cold for 1 month.     HPI Patient is in today for bilateral leg pain. known hx of PVD.  On statin and aspirin.  But over the last month has had significant increase in leg pain, dryness, hardening of the skin and some increased swelling.  She says she is not sure if her symptoms are just worsening of her previous leg symptoms or if something else has changed.  She has also been getting some small little bumps on her skin that look most like pimples.  But says its not really a rash.  She has been having more pain in her legs.  She says sometimes it almost feels like a burning sensation or cold.  But no itching.  She has been noticing more swelling in her right foot compared to her left foot for the last month as well.  At times she has been getting some pitting edema.  She also reports that when she does take her diuretic her right hand will go numb.  And when she does not take it it goes away.  Diabetes - no hypoglycemic events. No wounds or sores that are not healing well. No increased thirst or urination. Checking glucose at home. Taking medications as prescribed without any side effects.     ROS      Objective:    BP (!) 137/49   Pulse 92   Resp 18   Ht _0  (1.626 m)   Wt 214 lb (97.1 kg)   SpO2 94%   BMI 36.73 kg/m    Physical Exam Vitals and nursing note reviewed.  Constitutional:      Appearance: She is well-developed.  HENT:     Head: Normocephalic and atraumatic.  Cardiovascular:     Rate and Rhythm: Normal rate and regular rhythm.     Heart sounds: Normal heart sounds.  Pulmonary:     Effort: Pulmonary effort is normal.     Breath sounds: Normal breath sounds.  Musculoskeletal:     Comments: She has trace edema over the  right foot.  No edema over the left foot.  And 1+ pitting edema in both calves with some hyperpigmentation and very hardening and thickening of the skin over her calves as well.  She has some trace edema around her right ankle.  Dorsal pedal pulses 2+ on the left foot and dorsal pedal pulses 1+ on the right foot.  Good capillary refill of the great toes bilaterally.  Skin:    General: Skin is warm and dry.  Neurological:     Mental Status: She is alert and oriented to person, place, and time.  Psychiatric:        Behavior: Behavior normal.    Results for orders placed or performed in visit on 10/23/21  COMPLETE METABOLIC PANEL WITH GFR  Result Value Ref Range   Glucose, Bld 299 (H) 65 - 99 mg/dL   BUN 8 7 - 25 mg/dL   Creat 0.80 0.60 - 1.00 mg/dL   eGFR 77 > OR = 60 mL/min/1.54m   BUN/Creatinine Ratio NOT APPLICABLE 6 - 22 (calc)   Sodium 140 135 - 146 mmol/L   Potassium 4.2 3.5 - 5.3 mmol/L  Chloride 97 (L) 98 - 110 mmol/L   CO2 33 (H) 20 - 32 mmol/L   Calcium 9.6 8.6 - 10.4 mg/dL   Total Protein 7.6 6.1 - 8.1 g/dL   Albumin 4.3 3.6 - 5.1 g/dL   Globulin 3.3 1.9 - 3.7 g/dL (calc)   AG Ratio 1.3 1.0 - 2.5 (calc)   Total Bilirubin 0.6 0.2 - 1.2 mg/dL   Alkaline phosphatase (APISO) 84 37 - 153 U/L   AST 17 10 - 35 U/L   ALT 12 6 - 29 U/L  Fe+TIBC+Fer  Result Value Ref Range   Iron 112 45 - 160 mcg/dL   TIBC 324 250 - 450 mcg/dL (calc)   %SAT 35 16 - 45 % (calc)  CBC with Differential/Platelet  Result Value Ref Range   WBC 6.2 3.8 - 10.8 Thousand/uL   RBC 4.15 3.80 - 5.10 Million/uL   Hemoglobin 12.6 11.7 - 15.5 g/dL   HCT 40.9 35.0 - 45.0 %   MCV 98.6 80.0 - 100.0 fL   MCH 30.4 27.0 - 33.0 pg   MCHC 30.8 (L) 32.0 - 36.0 g/dL   RDW 13.3 11.0 - 15.0 %   Platelets 266 140 - 400 Thousand/uL   MPV 10.0 7.5 - 12.5 fL   Neutro Abs 3,708 1,500 - 7,800 cells/uL   Lymphs Abs 1,860 850 - 3,900 cells/uL   Absolute Monocytes 502 200 - 950 cells/uL   Eosinophils Absolute 112 15 -  500 cells/uL   Basophils Absolute 19 0 - 200 cells/uL   Neutrophils Relative % 59.8 %   Total Lymphocyte 30.0 %   Monocytes Relative 8.1 %   Eosinophils Relative 1.8 %   Basophils Relative 0.3 %  D-Dimer, Quantitative  Result Value Ref Range   D-Dimer, Quant 2.28 (H) <0.50 mcg/mL FEU  POCT glycosylated hemoglobin (Hb A1C)  Result Value Ref Range   Hemoglobin A1C 9.4 (A) 4.0 - 5.6 %   HbA1c POC (<> result, manual entry)     HbA1c, POC (prediabetic range)     HbA1c, POC (controlled diabetic range)    POCT UA - Microalbumin  Result Value Ref Range   Microalbumin Ur, POC 80m/L mg/L   Creatinine, POC 1032mdl mg/dL   Albumin/Creatinine Ratio, Urine, POC <3061m         Assessment & Plan:   Problem List Items Addressed This Visit       Endocrine   Diabetes mellitus type 2 in obese (HCC) - Primary    A1c jumped up significantly from 6.1-9.4.  She is not sure why.  She has been eating a little bit more fruit but her husband who is here with her today says she really does not eat a large amount at one time.  She says she is really almost completely cut out any kind of sweets.  He does have a history of what was felt to be a benign pancreatic lesion but we may need to work this up further.  I am concerned about such a significant jump in her A1c.  She does not have a current glucometer.  New prescription sent to pharmacy so she can start tracking glad blood glucose levels at home.      Relevant Medications   blood glucose meter kit and supplies   Other Relevant Orders   POCT glycosylated hemoglobin (Hb A1C) (Completed)   POCT UA - Microalbumin (Completed)   TSH + free T4   COMPLETE METABOLIC PANEL WITH GFR (Completed)   Fe+TIBC+Fer  CBC with Differential/Platelet (Completed)   D-Dimer, Quantitative (Completed)     Musculoskeletal and Integument   Venous stasis dermatitis of both lower extremities   Relevant Orders   POCT glycosylated hemoglobin (Hb A1C) (Completed)   POCT UA  - Microalbumin (Completed)   TSH + free T4   COMPLETE METABOLIC PANEL WITH GFR (Completed)   Fe+TIBC+Fer   CBC with Differential/Platelet (Completed)   D-Dimer, Quantitative (Completed)     Other   Pain and swelling of lower extremity    She has pain and swelling of both lower extremities.  I am concerned because she is up and was 16 pounds from when I saw her about 4 months ago so I do feel like some of this is likely fluid related.      Relevant Medications   furosemide (LASIX) 20 MG tablet   Other Visit Diagnoses     Numbness of right hand       Relevant Orders   POCT glycosylated hemoglobin (Hb A1C) (Completed)   POCT UA - Microalbumin (Completed)   TSH + free T4   COMPLETE METABOLIC PANEL WITH GFR (Completed)   Fe+TIBC+Fer   CBC with Differential/Platelet (Completed)   D-Dimer, Quantitative (Completed)   Lower extremity edema       Relevant Orders   POCT glycosylated hemoglobin (Hb A1C) (Completed)   POCT UA - Microalbumin (Completed)   TSH + free T4   COMPLETE METABOLIC PANEL WITH GFR (Completed)   Fe+TIBC+Fer   CBC with Differential/Platelet (Completed)   D-Dimer, Quantitative (Completed)   Macrocytic anemia       Relevant Orders   POCT glycosylated hemoglobin (Hb A1C) (Completed)   POCT UA - Microalbumin (Completed)   TSH + free T4   COMPLETE METABOLIC PANEL WITH GFR (Completed)   Fe+TIBC+Fer   CBC with Differential/Platelet (Completed)   D-Dimer, Quantitative (Completed)   SOB (shortness of breath)       Relevant Orders   POCT glycosylated hemoglobin (Hb A1C) (Completed)   POCT UA - Microalbumin (Completed)   TSH + free T4   COMPLETE METABOLIC PANEL WITH GFR (Completed)   Fe+TIBC+Fer   CBC with Differential/Platelet (Completed)   D-Dimer, Quantitative (Completed)       Intermittent numbness in the right hand when she takes the chlorthalidone.  Check blood pressures in both extremities to see if maybe there is a significant difference is highly  unusual.  The short-term I may give her a prescription for Lasix to use to try to pull some fluid off and see if she feels better.   For the lower extremity swelling-we will check renal function, check for thyroid disorder etc.  We will give her Lasix to use as needed to try to pull some of the excess fluid off.  Again she is up about 16 pounds since I last saw her.  We will also check a D-dimer just to rule out DVT.  Meds ordered this encounter  Medications   blood glucose meter kit and supplies    Sig: Dispense based on patient and insurance preference. Use daily as directed. E11.69    Dispense:  1 each    Refill:  0    Order Specific Question:   Number of strips    Answer:   200    Order Specific Question:   Number of lancets    Answer:   200   furosemide (LASIX) 20 MG tablet    Sig: Take 1 tablet (20 mg total) by mouth  daily as needed.    Dispense:  30 tablet    Refill:  0    Return in about 2 weeks (around 11/06/2021) for f/u swelling.  Beatrice Lecher, MD

## 2021-10-23 NOTE — Progress Notes (Signed)
BP Left arm: 125/56  p:76 BP Rt   arm:  126/50 p:76

## 2021-10-23 NOTE — Assessment & Plan Note (Signed)
She has pain and swelling of both lower extremities.  I am concerned because she is up and was 16 pounds from when I saw her about 4 months ago so I do feel like some of this is likely fluid related.

## 2021-10-23 NOTE — Assessment & Plan Note (Addendum)
A1c jumped up significantly from 6.1-9.4.  She is not sure why.  She has been eating a little bit more fruit but her husband who is here with her today says she really does not eat a large amount at one time.  She says she is really almost completely cut out any kind of sweets.  He does have a history of what was felt to be a benign pancreatic lesion but we may need to work this up further.  I am concerned about such a significant jump in her A1c.  She does not have a current glucometer.  New prescription sent to pharmacy so she can start tracking glad blood glucose levels at home.

## 2021-10-24 ENCOUNTER — Telehealth: Payer: Medicare PPO

## 2021-10-24 ENCOUNTER — Telehealth: Payer: Self-pay | Admitting: Pharmacist

## 2021-10-24 ENCOUNTER — Ambulatory Visit (INDEPENDENT_AMBULATORY_CARE_PROVIDER_SITE_OTHER): Payer: Medicare PPO

## 2021-10-24 DIAGNOSIS — M79605 Pain in left leg: Secondary | ICD-10-CM

## 2021-10-24 DIAGNOSIS — M79606 Pain in leg, unspecified: Secondary | ICD-10-CM

## 2021-10-24 DIAGNOSIS — M79604 Pain in right leg: Secondary | ICD-10-CM | POA: Diagnosis not present

## 2021-10-24 DIAGNOSIS — M7989 Other specified soft tissue disorders: Secondary | ICD-10-CM

## 2021-10-24 DIAGNOSIS — R7989 Other specified abnormal findings of blood chemistry: Secondary | ICD-10-CM

## 2021-10-24 DIAGNOSIS — R600 Localized edema: Secondary | ICD-10-CM | POA: Diagnosis not present

## 2021-10-24 LAB — COMPLETE METABOLIC PANEL WITH GFR
AG Ratio: 1.3 (calc) (ref 1.0–2.5)
ALT: 12 U/L (ref 6–29)
AST: 17 U/L (ref 10–35)
Albumin: 4.3 g/dL (ref 3.6–5.1)
Alkaline phosphatase (APISO): 84 U/L (ref 37–153)
BUN: 8 mg/dL (ref 7–25)
CO2: 33 mmol/L — ABNORMAL HIGH (ref 20–32)
Calcium: 9.6 mg/dL (ref 8.6–10.4)
Chloride: 97 mmol/L — ABNORMAL LOW (ref 98–110)
Creat: 0.8 mg/dL (ref 0.60–1.00)
Globulin: 3.3 g/dL (calc) (ref 1.9–3.7)
Glucose, Bld: 299 mg/dL — ABNORMAL HIGH (ref 65–99)
Potassium: 4.2 mmol/L (ref 3.5–5.3)
Sodium: 140 mmol/L (ref 135–146)
Total Bilirubin: 0.6 mg/dL (ref 0.2–1.2)
Total Protein: 7.6 g/dL (ref 6.1–8.1)
eGFR: 77 mL/min/{1.73_m2} (ref >=60)

## 2021-10-24 LAB — IRON,TIBC AND FERRITIN PANEL
%SAT: 35 % (calc) (ref 16–45)
Ferritin: 80 ng/mL (ref 16–288)
Iron: 112 ug/dL (ref 45–160)
TIBC: 324 mcg/dL (calc) (ref 250–450)

## 2021-10-24 LAB — CBC WITH DIFFERENTIAL/PLATELET
Absolute Monocytes: 502 cells/uL (ref 200–950)
Basophils Absolute: 19 cells/uL (ref 0–200)
Basophils Relative: 0.3 %
Eosinophils Absolute: 112 cells/uL (ref 15–500)
Eosinophils Relative: 1.8 %
HCT: 40.9 % (ref 35.0–45.0)
Hemoglobin: 12.6 g/dL (ref 11.7–15.5)
Lymphs Abs: 1860 cells/uL (ref 850–3900)
MCH: 30.4 pg (ref 27.0–33.0)
MCHC: 30.8 g/dL — ABNORMAL LOW (ref 32.0–36.0)
MCV: 98.6 fL (ref 80.0–100.0)
MPV: 10 fL (ref 7.5–12.5)
Monocytes Relative: 8.1 %
Neutro Abs: 3708 cells/uL (ref 1500–7800)
Neutrophils Relative %: 59.8 %
Platelets: 266 10*3/uL (ref 140–400)
RBC: 4.15 10*6/uL (ref 3.80–5.10)
RDW: 13.3 % (ref 11.0–15.0)
Total Lymphocyte: 30 %
WBC: 6.2 10*3/uL (ref 3.8–10.8)

## 2021-10-24 LAB — D-DIMER, QUANTITATIVE: D-Dimer, Quant: 2.28 mcg/mL FEU — ABNORMAL HIGH (ref <0.50)

## 2021-10-24 LAB — TSH+FREE T4: TSH W/REFLEX TO FT4: 0.98 mIU/L (ref 0.40–4.50)

## 2021-10-24 NOTE — Telephone Encounter (Signed)
Patient advised of results and recommendations.  

## 2021-10-24 NOTE — Telephone Encounter (Signed)
LVM advising pt to return call to discuss labs and next steps.

## 2021-10-24 NOTE — Progress Notes (Deleted)
Chronic Care Management Pharmacy Note  10/24/2021 Name:  Shelby Thomas MRN:  [id number] DOB:  [birth year]  Summary: addressed DM, HLD. Patient mentioned having trouble obtaining CPAP supplies and is working with her insurance company to resolve.  Recommendations/Changes made from today's visit: recommend patient obtain ordered labs, facilitated lab collection, otherwise no medication changes at this time.    Plan: f/u with pharmacist in 6-8 months  Subjective: Shelby Thomas is an 76 y.o. year old female who is a primary patient of Metheney, Barbarann Ehlers, MD.  The CCM team was consulted for assistance with disease management and care coordination needs.    Engaged with patient face to face for initial visit in response to provider referral for pharmacy case management and/or care coordination services.   Consent to Services:  The patient was given information about Chronic Care Management services, agreed to services, and gave verbal consent prior to initiation of services.  Please see initial visit note for detailed documentation.   Patient Care Team: Agapito Games, MD as PCP - General (Family Medicine) Early, Sung Amabile, NP as Nurse Practitioner (Nurse Practitioner) Gabriel Carina, [facility] as Pharmacist (Pharmacist)  Recent office visits:  11/19/20 Agapito Games MD - Seen for diabetes - Labs ordered - No medication changes noted - Follow up in 6 months  10/03/20 Tandy Gaw PA - Seen for acute bilateral low back pain - X Ray of lumbar ordered - Start prednisone 50 mg, 1 tablet for 5 days - Start methocarbamol 500 mg tablet, 1 tablet by mouth 3 times daily - No follow up noted    Recent consult visits:  None noted    Hospital visits:  None in previous 6 months  Objective:  Lab Results  Component Value Date   CREATININE 0.80 10/23/2021   CREATININE 0.67 03/06/2021   CREATININE 0.60 07/20/2020    Lab Results  Component Value Date   HGBA1C 9.4 (A) 10/23/2021    Last diabetic Eye exam:  Lab Results  Component Value Date/Time   HMDIABEYEEXA No Retinopathy 01/02/2021 12:00 AM          Component Value Date/Time   CHOL 197 03/06/2021 0000   TRIG 92 03/06/2021 0000   HDL 74 03/06/2021 0000   CHOLHDL 2.7 03/06/2021 0000   VLDL 17 03/03/2016 1128   LDLCALC 104 (H) 03/06/2021 0000   LDLDIRECT 105 (H) 11/23/2008 2232       Latest Ref Rng & Units 10/23/2021   12:00 AM 03/06/2021   12:00 AM 12/21/2019   12:04 PM  Hepatic Function  Total Protein 6.1 - 8.1 g/dL 7.6  7.2  7.1   AST 10 - 35 U/L 17  19  14    ALT 6 - 29 U/L 12  16  10    Total Bilirubin 0.2 - 1.2 mg/dL 0.6  0.5  0.4     Lab Results  Component Value Date/Time   TSH 0.78 11/25/2017 11:12 AM   TSH 0.88 12/24/2015 02:58 PM       Latest Ref Rng & Units 10/23/2021   12:00 AM 03/06/2021   12:00 AM 03/28/2020    1:31 PM  CBC  WBC 3.8 - 10.8 Thousand/uL 6.2  4.3    Hemoglobin 11.7 - 15.5 g/dL 13/06/2020  03/30/2020  7.4   Hematocrit 35.0 - 45.0 % 40.9  42.1    Platelets 140 - 400 Thousand/uL 266  246      No results found for: "VD25OH"  Clinical ASCVD:  Yes  The 10-year ASCVD risk score (Arnett DK, et al., 2019) is: 36.9%   Values used to calculate the score:     Age: 76 years     Sex: Female     Is Non-Hispanic African American: Yes     Diabetic: Yes     Tobacco smoker: No     Systolic Blood Pressure: 137 mmHg     Is BP treated: Yes     HDL Cholesterol: 74 mg/dL     Total Cholesterol: 197 mg/dL    Other: ([id number] if Afib, PHQ9 if depression, MMRC or CAT for COPD, ACT, DEXA)  Social History   Tobacco Use  Smoking Status Never  Smokeless Tobacco Never   BP Readings from Last 3 Encounters:  10/23/21 (!) 137/49  06/20/21 140/61  11/19/20 (!) 144/61   Pulse Readings from Last 3 Encounters:  10/23/21 92  06/20/21 92  11/19/20 81   Wt Readings from Last 3 Encounters:  10/23/21 214 lb (97.1 kg)  06/20/21 196 lb (88.9 kg)  11/19/20 198 lb (89.8 kg)    Assessment:  Review of patient past medical history, allergies, medications, health status, including review of consultants reports, laboratory and other test data, was performed as part of comprehensive evaluation and provision of chronic care management services.   SDOH:  (Social Determinants of Health) assessments and interventions performed:    CCM Care Plan  Allergies  Allergen Reactions   Ramipril     REACTION: cough   Tramadol Other (See Comments)    Nausea    Medications Reviewed Today     Reviewed by Osborne Oman, CMA (Certified Medical Assistant) on 10/23/21 at 1429  Med List Status: <None>   Medication Order Taking? Sig Documenting Provider Last Dose Status Informant  AMBULATORY NON FORMULARY MEDICATION [id number] Yes Medication Name: Oxygen Tubing for overnight O2. G47.34 I27.20 Fax:[phone] Agapito Games, MD Taking Active   ascorbic acid (VITAMIN C) 250 MG CHEW [id number] Yes Chew 250 mg by mouth daily. [provider] Taking Active   calcium citrate (CALCITRATE - DOSED IN MG ELEMENTAL CALCIUM) 950 (200 Ca) MG tablet [id number] Yes Take 200 mg of elemental calcium by mouth daily. [provider] Taking Active   chlorthalidone (HYGROTON) 25 MG tablet [id number] Yes Take 1 tablet (25 mg total) by mouth daily as needed. For swelling in legs Agapito Games, MD Taking Active   diclofenac Sodium (VOLTAREN) 1 % GEL [id number] Yes Apply 2 g topically 4 (four) times daily. Up to 4 joints Agapito Games, MD Taking Active   ferrous sulfate 324 MG TBEC [id number] Yes Take 1 tablet by mouth daily with breakfast. [provider] Taking Active   metFORMIN (GLUCOPHAGE-XR) 500 MG 24 hr tablet [id number] Yes TAKE ONE TABLET (500MG  TOTAL) BY MOUTH DAILY WITH BREAKFAST , MD Taking Active   Multiple Vitamin (MULTIVITAMIN) capsule Agapito Games Yes Take 1 capsule by mouth daily. [provider] Taking Active   Omega-3 Fatty Acids (FISH  OIL) 1000 MG CAPS [id number] Yes Take 1,000 mg by mouth daily. [provider] Taking Active   pantoprazole (PROTONIX) 40 MG tablet [id number] Yes TAKE ONE TABLET (40 MG TOTAL) BY MOUTH DAILY [id number], MD Taking Active   simvastatin (ZOCOR) 40 MG tablet Agapito Games Yes Take 1 tablet (40 mg total) by mouth daily. [id number], MD Taking Active             Patient Active Problem List   Diagnosis Date  Noted   Venous stasis dermatitis of both lower extremities 06/20/2021   Acute bilateral low back pain without sciatica 10/05/2020   DDD (degenerative disc disease), lumbar 10/05/2020   Iron deficiency anemia due to chronic blood loss 04/23/2020   Pancreatic lesion 04/23/2020   Weakness of both lower extremities 04/23/2020   H/O congenital atrial septal defect (ASD) repair 03/22/2020   BRBPR (bright red blood per rectum) 05/05/2019   Nocturnal hypoxia 06/11/2018   Aortic atherosclerosis (HCC) 03/30/2017   Hemoglobin low 03/04/2017   Scoliosis of thoracic spine 12/04/2016   Primary osteoarthritis of left shoulder 07/07/2016   Pseudophakia of both eyes 04/22/2016   Pain and swelling of lower extremity 12/31/2015   Arthrofibrosis of knee joint, left 02/01/2014   OSA on CPAP 10/12/2013   PVD (peripheral vascular disease) (HCC) 03/31/2012   Morbid obesity (HCC) 07/03/2011   Diabetes mellitus type 2 in obese (HCC) 11/08/2010   ARTHRITIS, CERVICAL SPINE 05/08/2009   History of total knee arthroplasty, left 03/02/2008   Lumbar radiculopathy 12/29/2007   ANKLE EDEMA 12/29/2007   ESOPHAGEAL STRICTURE 08/18/2007   GERD 07/19/2007   HAIR LOSS 04/19/2007   History of total left hip arthroplasty 04/19/2007   Pulmonary hypertension (HCC) 12/17/2006   Scoliosis 12/17/2006   VALVULAR HEART DISEASE 08/14/2006   Hyperlipidemia 04/10/2006    Immunization History  Administered Date(s) Administered   Fluad Quad(high Dose 65+) 12/27/2018, 01/11/2020, 06/20/2021    Influenza Whole 04/02/2006, 04/19/2007   Influenza, High Dose Seasonal PF 12/24/2016, 02/18/2018   Influenza, Seasonal, Injecte, Preservative Fre 03/31/2012   Influenza,inj,Quad PF,6+ Mos 02/28/2013, 03/02/2014, 03/20/2015, 12/24/2015   PFIZER(Purple Top)SARS-COV-2 Vaccination 07/06/2019, 07/27/2019   Pneumococcal Conjugate-13 11/17/2014   Pneumococcal Polysaccharide-23 04/02/2006, 07/01/2012   Tdap 10/29/2010   Zoster, Live 12/23/2010    Conditions to be addressed/monitored: HLD and DMII  There are no care plans that you recently modified to display for this patient.   Medication Assistance: None required.  Patient affirms current coverage meets needs.  Patient's preferred pharmacy is:  [facility] - [place], [place] - [address] [place] [address] Phone: [phone] Fax: [phone]  Uses pill box? No - puts medicine into one single bottle, works well for her she states Pt endorses 100% compliance  Follow Up:  Patient agrees to Care Plan and Follow-up.  Plan: Telephone follow up appointment with care management team member scheduled for:  6-8 months  Lynnda Shields, PharmD Clinical Pharmacist [facility] [phone]

## 2021-10-24 NOTE — Telephone Encounter (Signed)
Okay, will reach out to patient this morning.

## 2021-10-24 NOTE — Telephone Encounter (Signed)
Attempted x2 to reach patient regarding this afternoon's telephone visit scheduled with me for 10/24/2021 at 2:00pm.   Rescheduled to Monday, 10/28/2021 at 1:30pm. Left voicemail detailing this schedule change, as well as importance of contacting our office at [phone] to discuss recent labwork and next steps.  Lynnda Shields, PharmD Clinical Pharmacist [facility] [phone]

## 2021-10-28 ENCOUNTER — Ambulatory Visit (INDEPENDENT_AMBULATORY_CARE_PROVIDER_SITE_OTHER): Payer: Medicare PPO | Admitting: Pharmacist

## 2021-10-28 DIAGNOSIS — E1169 Type 2 diabetes mellitus with other specified complication: Secondary | ICD-10-CM

## 2021-10-28 DIAGNOSIS — E7800 Pure hypercholesterolemia, unspecified: Secondary | ICD-10-CM

## 2021-10-28 NOTE — Progress Notes (Addendum)
Chronic Care Management Pharmacy Note  10/29/2021 Name:  Shelby Thomas MRN:  [id number] DOB:  07/22/1945  Summary: addressed DM, HLD. Patient recently had elevated d-dimer, though negative for dvt on doppler imaging. She started furosemide for excessive bilateral lower extremity edema, and is 3 days into her RX.  A1c sharp uptrend (6.1 to 9.4), she attributes to poor diet.   Recommendations/Changes made from today's visit: recommend patient adjust her nutrition habits, as she declines any  medication changes at this time related to recent uncontrolled a1c.    Plan: f/u with pharmacist in 3 months to reassess a1c and diabetes regimen.  Subjective: Shelby Thomas is an 76 y.o. year old female who is a primary patient of Metheney, Rene Kocher, MD.  The CCM team was consulted for assistance with disease management and care coordination needs.    Engaged with patient by telephone for follow up visit in response to provider referral for pharmacy case management and/or care coordination services.   Consent to Services:  The patient was given information about Chronic Care Management services, agreed to services, and gave verbal consent prior to initiation of services.  Please see initial visit note for detailed documentation.   Patient Care Team: Hali Marry, MD as PCP - General (Family Medicine) Early, Coralee Pesa, NP as Nurse Practitioner (Nurse Practitioner) Darius Bump, [facility] as Pharmacist (Pharmacist)  Recent office visits:  11/19/20 Hali Marry MD - Seen for diabetes - Labs ordered - No medication changes noted - Follow up in 6 months  10/03/20 Iran Planas PA - Seen for acute bilateral low back pain - X Ray of lumbar ordered - Start prednisone 50 mg, 1 tablet for 5 days - Start methocarbamol 500 mg tablet, 1 tablet by mouth 3 times daily - No follow up noted    Recent consult visits:  None noted    Hospital visits:  None in previous 6 months  Objective:  Lab  Results  Component Value Date   CREATININE 0.80 10/23/2021   CREATININE 0.67 03/06/2021   CREATININE 0.60 07/20/2020    Lab Results  Component Value Date   HGBA1C 9.4 (A) 10/23/2021   Last diabetic Eye exam:  Lab Results  Component Value Date/Time   HMDIABEYEEXA No Retinopathy 01/02/2021 12:00 AM          Component Value Date/Time   CHOL 197 03/06/2021 0000   TRIG 92 03/06/2021 0000   HDL 74 03/06/2021 0000   CHOLHDL 2.7 03/06/2021 0000   VLDL 17 03/03/2016 1128   LDLCALC 104 (H) 03/06/2021 0000   LDLDIRECT 105 (H) 11/23/2008 2232       Latest Ref Rng & Units 10/23/2021   12:00 AM 03/06/2021   12:00 AM 12/21/2019   12:04 PM  Hepatic Function  Total Protein 6.1 - 8.1 g/dL 7.6  7.2  7.1   AST 10 - 35 U/L '17  19  14   ' ALT 6 - 29 U/L '12  16  10   ' Total Bilirubin 0.2 - 1.2 mg/dL 0.6  0.5  0.4     Lab Results  Component Value Date/Time   TSH 0.78 11/25/2017 11:12 AM   TSH 0.88 12/24/2015 02:58 PM       Latest Ref Rng & Units 10/23/2021   12:00 AM 03/06/2021   12:00 AM 03/28/2020    1:31 PM  CBC  WBC 3.8 - 10.8 Thousand/uL 6.2  4.3    Hemoglobin 11.7 - 15.5 g/dL 12.6  13.8  7.4   Hematocrit 35.0 - 45.0 % 40.9  42.1    Platelets 140 - 400 Thousand/uL 266  246      No results found for: "VD25OH"  Clinical ASCVD: Yes  The 10-year ASCVD risk score (Arnett DK, et al., 2019) is: 36.9%   Values used to calculate the score:     Age: 76 years     Sex: Female     Is Non-Hispanic African American: Yes     Diabetic: Yes     Tobacco smoker: No     Systolic Blood Pressure: 409 mmHg     Is BP treated: Yes     HDL Cholesterol: 74 mg/dL     Total Cholesterol: 197 mg/dL    Other: (CHADS2VASc if Afib, PHQ9 if depression, MMRC or CAT for COPD, ACT, DEXA)  Social History   Tobacco Use  Smoking Status Never  Smokeless Tobacco Never   BP Readings from Last 3 Encounters:  10/23/21 (!) 137/49  06/20/21 140/61  11/19/20 (!) 144/61   Pulse Readings from Last 3  Encounters:  10/23/21 92  06/20/21 92  11/19/20 81   Wt Readings from Last 3 Encounters:  10/23/21 214 lb (97.1 kg)  06/20/21 196 lb (88.9 kg)  11/19/20 198 lb (89.8 kg)    Assessment: Review of patient past medical history, allergies, medications, health status, including review of consultants reports, laboratory and other test data, was performed as part of comprehensive evaluation and provision of chronic care management services.   SDOH:  (Social Determinants of Health) assessments and interventions performed:    CCM Care Plan  Allergies  Allergen Reactions   Ramipril     REACTION: cough   Tramadol Other (See Comments)    Nausea    Medications Reviewed Today     Reviewed by Darius Bump, [facility] (Pharmacist) on 10/28/21 at 1341  Med List Status: <None>   Medication Order Taking? Sig Documenting Provider Last Dose Status Informant  AMBULATORY NON FORMULARY MEDICATION [id number] Yes Medication Name: Oxygen Tubing for overnight O2. G47.34 I27.20 [phone] Hali Marry, MD Taking Active   ascorbic acid (VITAMIN C) 250 MG CHEW [id number] Yes Chew 250 mg by mouth daily. [provider] Taking Active   blood glucose meter kit and supplies [id number] Yes Dispense based on patient and insurance preference. Use daily as directed. E11.69 Hali Marry, MD Taking Active   calcium citrate (CALCITRATE - DOSED IN MG ELEMENTAL CALCIUM) 950 (200 Ca) MG tablet [id number] Yes Take 200 mg of elemental calcium by mouth daily. [provider] Taking Active   chlorthalidone (HYGROTON) 25 MG tablet [id number] No Take 1 tablet (25 mg total) by mouth daily as needed. For swelling in legs  Patient not taking: Reported on 10/28/2021   Hali Marry, MD Not Taking Active   diclofenac Sodium (VOLTAREN) 1 % GEL [id number] Yes Apply 2 g topically 4 (four) times daily. Up to 4 joints Hali Marry, MD Taking Active   ferrous sulfate 324 MG TBEC [id number]  Yes Take 1 tablet by mouth daily with breakfast. [provider] Taking Active   furosemide (LASIX) 20 MG tablet [id number] Yes Take 1 tablet (20 mg total) by mouth daily as needed. Hali Marry, MD Taking Active   metFORMIN (GLUCOPHAGE-XR) 500 MG 24 hr tablet [id number] Yes TAKE ONE TABLET (500MG TOTAL) BY MOUTH DAILY WITH BREAKFAST Hali Marry, MD Taking Active   Multiple Vitamin (MULTIVITAMIN) capsule [id number] Yes Take 1 capsule  by mouth daily. [provider] Taking Active   Omega-3 Fatty Acids (FISH OIL) 1000 MG CAPS [id number] Yes Take 1,000 mg by mouth daily. [provider] Taking Active   pantoprazole (PROTONIX) 40 MG tablet [id number] Yes TAKE ONE TABLET (40 MG TOTAL) BY MOUTH DAILY Hali Marry, MD Taking Active   simvastatin (ZOCOR) 40 MG tablet [id number] Yes Take 1 tablet (40 mg total) by mouth daily. Hali Marry, MD Taking Active             Patient Active Problem List   Diagnosis Date Noted   Venous stasis dermatitis of both lower extremities 06/20/2021   Acute bilateral low back pain without sciatica 10/05/2020   DDD (degenerative disc disease), lumbar 10/05/2020   Iron deficiency anemia due to chronic blood loss 04/23/2020   Pancreatic lesion 04/23/2020   Weakness of both lower extremities 04/23/2020   H/O congenital atrial septal defect (ASD) repair 03/22/2020   BRBPR (bright red blood per rectum) 05/05/2019   Nocturnal hypoxia 06/11/2018   Aortic atherosclerosis ([place]) 03/30/2017   Hemoglobin low 03/04/2017   Scoliosis of thoracic spine 12/04/2016   Primary osteoarthritis of left shoulder 07/07/2016   Pseudophakia of both eyes 04/22/2016   Pain and swelling of lower extremity 12/31/2015   Arthrofibrosis of knee joint, left 02/01/2014   OSA on CPAP 10/12/2013   PVD (peripheral vascular disease) ([place]) 03/31/2012   Morbid obesity ([place]) 07/03/2011   Diabetes mellitus type 2 in obese ([place]) 11/08/2010    ARTHRITIS, CERVICAL SPINE 05/08/2009   History of total knee arthroplasty, left 03/02/2008   Lumbar radiculopathy 12/29/2007   ANKLE EDEMA 12/29/2007   ESOPHAGEAL STRICTURE 08/18/2007   GERD 07/19/2007   HAIR LOSS 04/19/2007   History of total left hip arthroplasty 04/19/2007   Pulmonary hypertension ([place]) 12/17/2006   Scoliosis 12/17/2006   VALVULAR HEART DISEASE 08/14/2006   Hyperlipidemia 04/10/2006    Immunization History  Administered Date(s) Administered   Fluad Quad(high Dose 65+) 12/27/2018, 01/11/2020, 06/20/2021   Influenza Whole 04/02/2006, 04/19/2007   Influenza, High Dose Seasonal PF 12/24/2016, 02/18/2018   Influenza, Seasonal, Injecte, Preservative Fre 03/31/2012   Influenza,inj,Quad PF,6+ Mos 02/28/2013, 03/02/2014, 03/20/2015, 12/24/2015   PFIZER(Purple Top)SARS-COV-2 Vaccination 07/06/2019, 07/27/2019   Pneumococcal Conjugate-13 11/17/2014   Pneumococcal Polysaccharide-23 04/02/2006, 07/01/2012   Tdap 10/29/2010   Zoster, Live 12/23/2010    Conditions to be addressed/monitored: HLD and DMII  Care Plan : Medication Management  Updates made by Darius Bump, [place] since 10/29/2021 12:00 AM     Problem: DM, HLD      Long-Range Goal: Disease Progression Prevention   Recent Progress: On track  Priority: High  Note:   Current Barriers:  None at present  Pharmacist Clinical Goal(s):  Over the next 90 days, patient will maintain control of chronic conditions as evidenced by medication fill history, lab values, and vital signs  through collaboration with PharmD and provider.   Interventions: 1:1 collaboration with Hali Marry, MD regarding development and update of comprehensive plan of care as evidenced by provider attestation and co-signature Inter-disciplinary care team collaboration (see longitudinal plan of care) Comprehensive medication review performed; medication list updated in electronic medical record  Diabetes:  Controlled; current  treatment:metformin XR 565m daily; a1c up  Current glucose readings:not currently checking  Denies hypoglycemic/hyperglycemic symptoms  Current meal patterns: breakfast: bowl of cereal, or muffin, or sausage biscuit, some little toast; lunch: skips; dinner: vegetables (not a fan of meats); snacks: peanuts; drinks: water, pepsi occasionally,  tea occasionally  Current exercise: limited   Recommended continue current regimen Hyperlipidemia:  Controlled; current treatment:simvastatin 23m daily, fish oil daily; (last filled 2019?), LDL 102 in 2021. Pt states she received surplus last time, so has plenty.   Recommended obtain annual labs, continue current regimen.  Patient Goals/Self-Care Activities Over the next 90 days, patient will:  take medications as prescribed, opt for lean proteins, veggies, fruits, while doing carbs or sweets in moderation.  Follow Up Plan: Telephone follow up appointment with care management team member scheduled for:  3 months      Medication Assistance: None required.  Patient affirms current coverage meets needs.  [facility] [facility]  [facility] [address]Phone: [phone]: [phone] Uses pill box? No - puts medicine into one single bottle, works well for her she states Pt endorses 100% compliance  Follow Up:  Patient agrees to Care Plan and Follow-up.  Plan: Telephone follow up appointment with care management team member scheduled for:  3 months  KLarinda Buttery PharmD Clinical Pharmacist [phone]

## 2021-11-01 DIAGNOSIS — Z7984 Long term (current) use of oral hypoglycemic drugs: Secondary | ICD-10-CM

## 2021-11-01 DIAGNOSIS — E785 Hyperlipidemia, unspecified: Secondary | ICD-10-CM

## 2021-11-01 DIAGNOSIS — E1169 Type 2 diabetes mellitus with other specified complication: Secondary | ICD-10-CM | POA: Diagnosis not present

## 2021-11-06 ENCOUNTER — Encounter: Payer: Self-pay | Admitting: Family Medicine

## 2021-11-06 ENCOUNTER — Ambulatory Visit (INDEPENDENT_AMBULATORY_CARE_PROVIDER_SITE_OTHER): Payer: Medicare PPO | Admitting: Family Medicine

## 2021-11-06 VITALS — BP 128/46 | HR 83 | Resp 18 | Ht 64.0 in | Wt 206.0 lb

## 2021-11-06 DIAGNOSIS — M79606 Pain in leg, unspecified: Secondary | ICD-10-CM | POA: Diagnosis not present

## 2021-11-06 DIAGNOSIS — I739 Peripheral vascular disease, unspecified: Secondary | ICD-10-CM | POA: Diagnosis not present

## 2021-11-06 DIAGNOSIS — E669 Obesity, unspecified: Secondary | ICD-10-CM

## 2021-11-06 DIAGNOSIS — E1169 Type 2 diabetes mellitus with other specified complication: Secondary | ICD-10-CM | POA: Diagnosis not present

## 2021-11-06 DIAGNOSIS — R600 Localized edema: Secondary | ICD-10-CM

## 2021-11-06 DIAGNOSIS — M7989 Other specified soft tissue disorders: Secondary | ICD-10-CM | POA: Diagnosis not present

## 2021-11-06 DIAGNOSIS — I872 Venous insufficiency (chronic) (peripheral): Secondary | ICD-10-CM | POA: Diagnosis not present

## 2021-11-06 NOTE — Assessment & Plan Note (Signed)
Continue Zocor for peripheral vascular disease.

## 2021-11-06 NOTE — Patient Instructions (Signed)
See if you are able to get a digital scale to monitor your weights at home.  If your weight is up 3 pounds or more from your "dry weight" then please take your diuretic.  If you can keep track of how often in general you are taking the diuretic that would be helpful.

## 2021-11-06 NOTE — Assessment & Plan Note (Signed)
She still has some thickened dark pigmented skin around the ankles bilaterally but the swelling is much improved.

## 2021-11-06 NOTE — Progress Notes (Signed)
Established Patient Office Visit  Subjective   Patient ID: Shelby Thomas, female    DOB: 08-29-1945  Age: 76 y.o. MRN: [id number]  Chief Complaint  Patient presents with   Follow-up    2 week follow up for bilateral leg swelling. Patient states swelling in legs are better.     HPI  She is here today from previous follow-up on June 21.  She was having significant increase in bilateral leg pain as well as some slight increase in swelling.  We did labs that day and D-dimer was elevated.  We followed up with a Doppler which was negative.  Known history of peripheral vascular disease.  Currently on a statin and aspirin.  Did send over a diuretic for her to take to pull off the extra fluid since she has been swelling more than usual.  Her previous diuretic could actually cause some numbness and tingling in her hands so she has actually stopped it.  Her weight is down 8 pounds today from 2 weeks ago.  She is doing much better she says the swelling is much improved.  But she is down to 1 pill a day on the furosemide.  But she is worried about taking it every day because she said the heart doctor did not want her taking it every day.  She does not have a scale at home  Diabetes follow-up-she also had a significant jump in her A1c in a very short period of time from 6.1 up to 9.4.  She denied any significant dietary changes we did decide to get her a new glucometer.    ROS    Objective:     BP (!) 128/46   Pulse 83   Resp 18   Ht 5\' 4"  (1.626 m)   Wt 206 lb (93.4 kg)   SpO2 96%   BMI 35.36 kg/m    Physical Exam Vitals and nursing note reviewed.  Constitutional:      Appearance: She is well-developed.  HENT:     Head: Normocephalic and atraumatic.  Cardiovascular:     Rate and Rhythm: Normal rate and regular rhythm.     Heart sounds: Normal heart sounds.  Pulmonary:     Effort: Pulmonary effort is normal.     Breath sounds: Normal breath sounds.  Skin:    General: Skin is  warm and dry.  Neurological:     Mental Status: She is alert and oriented to person, place, and time.  Psychiatric:        Behavior: Behavior normal.      No results found for any visits on 11/06/21.    The 10-year ASCVD risk score (Arnett DK, et al., 2019) is: 33.8%    Assessment & Plan:   Problem List Items Addressed This Visit       Cardiovascular and Mediastinum   PVD (peripheral vascular disease) (HCC)    Continue Zocor for peripheral vascular disease.        Endocrine   Diabetes mellitus type 2 in obese [facility]) - Primary    He has really cut back on sweets since I last saw her.  She was able to get the glucometer from the pharmacy but does not know how to use it yet.  Did let her know that she is welcome to schedule a nurse visit and bring it in with her so that we can help her.  She can also ask her pharmacist about it.  She can also go online  to the website that makes it and they often times have wonderful videos via YouTube to demonstrate how to use the device.  But were more than happy to show her how.        Musculoskeletal and Integument   Venous stasis dermatitis of both lower extremities    She still has some thickened dark pigmented skin around the ankles bilaterally but the swelling is much improved.        Other   Pain and swelling of lower extremity    She is down 6 pounds to her dry weight right around 206 pounds we discussed getting a scale.  She is worried about continuing the diuretic medication so we discussed getting the scale so that she can monitor her weight gain if she is up more than 3 pounds then encouraged her to take the Lasix and if she is just up 1 or 2 pounds to hold off and continue to monitor daily.  I want her to keep track of how often she is using the Lasix.  We will recheck BMP today.      Other Visit Diagnoses     Lower extremity edema       Relevant Orders   BASIC METABOLIC PANEL WITH GFR       Return in about 3 months  (around 02/06/2022) for Swelling.    Shelby Gasser, MD

## 2021-11-06 NOTE — Assessment & Plan Note (Signed)
She is down 6 pounds to her dry weight right around 206 pounds we discussed getting a scale.  She is worried about continuing the diuretic medication so we discussed getting the scale so that she can monitor her weight gain if she is up more than 3 pounds then encouraged her to take the Lasix and if she is just up 1 or 2 pounds to hold off and continue to monitor daily.  I want her to keep track of how often she is using the Lasix.  We will recheck BMP today.

## 2021-11-06 NOTE — Assessment & Plan Note (Signed)
He has really cut back on sweets since I last saw her.  She was able to get the glucometer from the pharmacy but does not know how to use it yet.  Did let her know that she is welcome to schedule a nurse visit and bring it in with her so that we can help her.  She can also ask her pharmacist about it.  She can also go online to the website that makes it and they often times have wonderful videos via YouTube to demonstrate how to use the device.  But were more than happy to show her how.

## 2021-11-07 LAB — BASIC METABOLIC PANEL WITH GFR
BUN: 14 mg/dL (ref 7–25)
CO2: 33 mmol/L — ABNORMAL HIGH (ref 20–32)
Calcium: 9.7 mg/dL (ref 8.6–10.4)
Chloride: 99 mmol/L (ref 98–110)
Creat: 0.83 mg/dL (ref 0.60–1.00)
Glucose, Bld: 201 mg/dL — ABNORMAL HIGH (ref 65–99)
Potassium: 4 mmol/L (ref 3.5–5.3)
Sodium: 143 mmol/L (ref 135–146)
eGFR: 74 mL/min/{1.73_m2} (ref >=60)

## 2021-11-07 NOTE — Progress Notes (Signed)
Your lab work is within acceptable range and there are no concerning findings.   ?

## 2021-11-15 ENCOUNTER — Other Ambulatory Visit: Payer: Self-pay | Admitting: Family Medicine

## 2021-11-15 DIAGNOSIS — M79606 Pain in leg, unspecified: Secondary | ICD-10-CM

## 2021-12-18 ENCOUNTER — Ambulatory Visit: Payer: Medicare PPO | Admitting: Family Medicine

## 2022-01-24 ENCOUNTER — Telehealth: Payer: Medicare PPO

## 2022-01-24 ENCOUNTER — Telehealth: Payer: Self-pay | Admitting: Pharmacist

## 2022-01-24 NOTE — Telephone Encounter (Signed)
Unsuccessful attempt x2 to contact patient for follow-up phone call for CCM services with pharmacist.  Rescheduled patient for telephone visit in October, after PCP visit because she is due for rechecking Nenzel, PharmD Clinical Pharmacist [facility] [phone]

## 2022-02-06 ENCOUNTER — Other Ambulatory Visit: Payer: Self-pay | Admitting: Family Medicine

## 2022-02-06 ENCOUNTER — Ambulatory Visit: Payer: Medicare PPO | Admitting: Family Medicine

## 2022-02-11 ENCOUNTER — Ambulatory Visit (INDEPENDENT_AMBULATORY_CARE_PROVIDER_SITE_OTHER): Payer: Medicare PPO | Admitting: Family Medicine

## 2022-02-11 ENCOUNTER — Ambulatory Visit (INDEPENDENT_AMBULATORY_CARE_PROVIDER_SITE_OTHER): Payer: Medicare PPO

## 2022-02-11 VITALS — BP 144/57 | HR 95 | Ht 64.0 in

## 2022-02-11 DIAGNOSIS — R2242 Localized swelling, mass and lump, left lower limb: Secondary | ICD-10-CM

## 2022-02-11 DIAGNOSIS — M79662 Pain in left lower leg: Secondary | ICD-10-CM

## 2022-02-11 DIAGNOSIS — I272 Pulmonary hypertension, unspecified: Secondary | ICD-10-CM

## 2022-02-11 DIAGNOSIS — E669 Obesity, unspecified: Secondary | ICD-10-CM

## 2022-02-11 DIAGNOSIS — Z23 Encounter for immunization: Secondary | ICD-10-CM

## 2022-02-11 DIAGNOSIS — R600 Localized edema: Secondary | ICD-10-CM | POA: Diagnosis not present

## 2022-02-11 DIAGNOSIS — E1169 Type 2 diabetes mellitus with other specified complication: Secondary | ICD-10-CM

## 2022-02-11 LAB — POCT GLYCOSYLATED HEMOGLOBIN (HGB A1C): Hemoglobin A1C: 7.4 % — AB (ref 4.0–5.6)

## 2022-02-11 NOTE — Progress Notes (Signed)
Call pt: knot is just some swelling, no sign of clot. Can use an ACE wrap on it for comfort.

## 2022-02-11 NOTE — Assessment & Plan Note (Signed)
Much improved and seems to have resolved.  Hasn't had to take her diuretic in almost a month.

## 2022-02-11 NOTE — Assessment & Plan Note (Signed)
A1C looks great!!!  Continue to work on diet changes. Great progress!!   Lab Results  Component Value Date   HGBA1C 7.4 (A) 02/11/2022

## 2022-02-11 NOTE — Progress Notes (Signed)
Established Patient Office Visit  Subjective   Patient ID: Shelby Thomas, female    DOB: 1946-04-13  Age: 76 y.o. MRN: [id number]  Chief Complaint  Patient presents with   Follow-up   Diabetes    HPI  Follow-up lower extremity swelling.  She says overall her swelling is much improved.  In fact he has not had to take her diuretic in a most of month.  But now she has noticed a very hard tender area on that left inner lower leg just medial to the calf.  She does not member any trauma or injury it just feels really sore when she touches it.  Sometimes it even feels itchy.  Hyperlipidemia - tolerating stating well with no myalgias or significant side effects.  Lab Results  Component Value Date   CHOL 197 03/06/2021   HDL 74 03/06/2021   LDLCALC 104 (H) 03/06/2021   LDLDIRECT 105 (H) 11/23/2008   TRIG 92 03/06/2021   CHOLHDL 2.7 03/06/2021      Diabetes - no hypoglycemic events. No wounds or sores that are not healing well. No increased thirst or urination. Checking glucose at home. Taking medications as prescribed without any side effects.     ROS    Objective:     BP (!) 144/57   Pulse 95   Ht 5\' 4"  (1.626 m)   SpO2 98%   BMI 35.36 kg/m    Physical Exam Vitals and nursing note reviewed.  Constitutional:      Appearance: She is well-developed.  HENT:     Head: Normocephalic and atraumatic.  Cardiovascular:     Rate and Rhythm: Normal rate and regular rhythm.     Heart sounds: Normal heart sounds.  Pulmonary:     Effort: Pulmonary effort is normal.     Breath sounds: Normal breath sounds.  Skin:    General: Skin is warm and dry.  Neurological:     Mental Status: She is alert and oriented to person, place, and time.  Psychiatric:        Behavior: Behavior normal.      Results for orders placed or performed in visit on 02/11/22  POCT glycosylated hemoglobin (Hb A1C)  Result Value Ref Range   Hemoglobin A1C 7.4 (A) 4.0 - 5.6 %   HbA1c POC (<>  result, manual entry)     HbA1c, POC (prediabetic range)     HbA1c, POC (controlled diabetic range)        The 10-year ASCVD risk score (Arnett DK, et al., 2019) is: 39.3%    Assessment & Plan:   Problem List Items Addressed This Visit       Cardiovascular and Mediastinum   Pulmonary hypertension (HCC)     Endocrine   Diabetes mellitus type 2 in obese (HCC) - Primary    A1C looks great!!!  Continue to work on diet changes. Great progress!!   Lab Results  Component Value Date   HGBA1C 7.4 (A) 02/11/2022         Relevant Orders   POCT glycosylated hemoglobin (Hb A1C) (Completed)     Other   Lower extremity edema    Much improved and seems to have resolved.  Hasn't had to take her diuretic in almost a month.       Other Visit Diagnoses     Needs flu shot       Relevant Orders   Flu Vaccine QUAD High Dose(Fluad) (Completed)   Pain in left lower leg  Relevant Orders   [place] LT LOWER EXTREM LTD SOFT TISSUE NON VASCULAR (Completed)   Mass of left lower leg       Relevant Orders   [place] LT LOWER EXTREM LTD SOFT TISSUE NON VASCULAR (Completed)      Has a hard tender know on the inner left lower leg. Consider can be an SVT.  Will get an [place].    BP is up a little today.  Better at last OV>    Return in about 3 months (around 05/14/2022) for Diabetes follow-up.    Beatrice Lecher, MD

## 2022-02-21 ENCOUNTER — Telehealth: Payer: Medicare PPO

## 2022-02-21 NOTE — Progress Notes (Deleted)
Chronic Care Management Pharmacy Note  02/21/2022 Name:  Shelby Thomas MRN:  [id number] DOB:  December 12, 1945  Summary: addressed DM, HLD. Patient recently had elevated d-dimer, though negative for dvt on doppler imaging. She started furosemide for excessive bilateral lower extremity edema, and is 3 days into her RX.  A1c sharp uptrend (6.1 to 9.4), she attributes to poor diet.   Recommendations/Changes made from today's visit: recommend patient adjust her nutrition habits, as she declines any  medication changes at this time related to recent uncontrolled a1c.    Plan: f/u with pharmacist in 3 months to reassess a1c and diabetes regimen.  Subjective: Shelby Thomas is an 76 y.o. year old female who is a primary patient of Metheney, Rene Kocher, MD.  The CCM team was consulted for assistance with disease management and care coordination needs.    Engaged with patient by telephone for follow up visit in response to provider referral for pharmacy case management and/or care coordination services.   Consent to Services:  The patient was given information about Chronic Care Management services, agreed to services, and gave verbal consent prior to initiation of services.  Please see initial visit note for detailed documentation.   Patient Care Team: Hali Marry, MD as PCP - General (Family Medicine) Early, Coralee Pesa, NP as Nurse Practitioner (Nurse Practitioner) Darius Bump, [facility] as Pharmacist (Pharmacist)  Recent office visits:  11/19/20 Hali Marry MD - Seen for diabetes - Labs ordered - No medication changes noted - Follow up in 6 months  10/03/20 Iran Planas PA - Seen for acute bilateral low back pain - X Ray of lumbar ordered - Start prednisone 50 mg, 1 tablet for 5 days - Start methocarbamol 500 mg tablet, 1 tablet by mouth 3 times daily - No follow up noted    Recent consult visits:  None noted    Hospital visits:  None in previous 6  months  Objective:  Lab Results  Component Value Date   CREATININE 0.83 11/06/2021   CREATININE 0.80 10/23/2021   CREATININE 0.67 03/06/2021    Lab Results  Component Value Date   HGBA1C 7.4 (A) 02/11/2022   Last diabetic Eye exam:  Lab Results  Component Value Date/Time   HMDIABEYEEXA No Retinopathy 01/02/2021 12:00 AM          Component Value Date/Time   CHOL 197 03/06/2021 0000   TRIG 92 03/06/2021 0000   HDL 74 03/06/2021 0000   CHOLHDL 2.7 03/06/2021 0000   VLDL 17 03/03/2016 1128   LDLCALC 104 (H) 03/06/2021 0000   LDLDIRECT 105 (H) 11/23/2008 2232       Latest Ref Rng & Units 10/23/2021   12:00 AM 03/06/2021   12:00 AM 12/21/2019   12:04 PM  Hepatic Function  Total Protein 6.1 - 8.1 g/dL 7.6  7.2  7.1   AST 10 - 35 U/L $Remo'17  19  14   'KzJwo$ ALT 6 - 29 U/L $Remo'12  16  10   'KZgHp$ Total Bilirubin 0.2 - 1.2 mg/dL 0.6  0.5  0.4     Lab Results  Component Value Date/Time   TSH 0.78 11/25/2017 11:12 AM   TSH 0.88 12/24/2015 02:58 PM       Latest Ref Rng & Units 10/23/2021   12:00 AM 03/06/2021   12:00 AM 03/28/2020    1:31 PM  CBC  WBC 3.8 - 10.8 Thousand/uL 6.2  4.3    Hemoglobin 11.7 - 15.5 g/dL 12.6  13.8  7.4   Hematocrit 35.0 - 45.0 % 40.9  42.1    Platelets 140 - 400 Thousand/uL 266  246      No results found for: "VD25OH"  Clinical ASCVD: Yes  The 10-year ASCVD risk score (Arnett DK, et al., 2019) is: 39.3%   Values used to calculate the score:     Age: 76 years     Sex: Female     Is Non-Hispanic African American: Yes     Diabetic: Yes     Tobacco smoker: No     Systolic Blood Pressure: 110 mmHg     Is BP treated: Yes     HDL Cholesterol: 74 mg/dL     Total Cholesterol: 197 mg/dL    Other: (CHADS2VASc if Afib, PHQ9 if depression, MMRC or CAT for COPD, ACT, DEXA)  Social History   Tobacco Use  Smoking Status Never  Smokeless Tobacco Never   BP Readings from Last 3 Encounters:  02/11/22 (!) 144/57  11/06/21 (!) 128/46  10/23/21 (!) 137/49    Pulse Readings from Last 3 Encounters:  02/11/22 95  11/06/21 83  10/23/21 92   Wt Readings from Last 3 Encounters:  11/06/21 206 lb (93.4 kg)  10/23/21 214 lb (97.1 kg)  06/20/21 196 lb (88.9 kg)    Assessment: Review of patient past medical history, allergies, medications, health status, including review of consultants reports, laboratory and other test data, was performed as part of comprehensive evaluation and provision of chronic care management services.   SDOH:  (Social Determinants of Health) assessments and interventions performed:  SDOH Interventions    Flowsheet Row Office Visit from 02/11/2022 in [place] Office Visit from 11/06/2021 in [place] Office Visit from 10/23/2021 in [place] Office Visit from 08/05/2021 in [place] Visit from 07/23/2020 in [place] Visit from 12/21/2019 in [place] Interventions -- -- -- -- Intervention Not Indicated --  Housing Interventions -- -- -- Intervention Not Indicated Intervention Not Indicated --  Transportation Interventions -- -- -- -- Intervention Not Indicated --  Depression Interventions/Treatment  PHQ2-9 Score <4 Follow-up Not Indicated PHQ2-9 Score <4 Follow-up Not Indicated PHQ2-9 Score <4 Follow-up Not Indicated -- -- Counseling  Financial Strain Interventions -- -- -- -- Intervention Not Indicated --  Physical Activity Interventions -- -- -- Intervention Not Indicated Intervention Not Indicated --  Stress Interventions -- -- -- -- Intervention Not Indicated --  Social Connections Interventions -- -- -- Intervention Not Indicated Intervention Not Indicated --       CCM Care Plan  Allergies  Allergen Reactions   Ramipril     REACTION: cough    Tramadol Other (See Comments)    Nausea    Medications Reviewed Today     Reviewed by Annamaria Helling, CMA (Certified Medical Assistant) on 02/11/22 at [place] List Status: <None>   Medication Order Taking? Sig Documenting Provider Last Dose Status Informant  AMBULATORY NON FORMULARY MEDICATION [id number] Yes Medication Name: Oxygen Tubing for overnight O2. G47.34 I27.20 [phone] Hali Marry, MD Taking Active   ascorbic acid (VITAMIN C) 250 MG CHEW [id number] Yes Chew 250 mg by mouth daily. [provider] Taking Active   blood glucose meter kit and  supplies [id number] Yes Dispense based on patient and insurance preference. Use daily as directed. E11.69 Hali Marry, MD Taking Active   calcium citrate (CALCITRATE - DOSED IN MG ELEMENTAL CALCIUM) 950 (200 Ca) MG tablet [id number] Yes Take 200 mg of elemental calcium by mouth daily. [provider] Taking Active   chlorthalidone (HYGROTON) 25 MG tablet [id number] Yes Take 1 tablet (25 mg total) by mouth daily as needed. For swelling in legs Hali Marry, MD Taking Active   diclofenac Sodium (VOLTAREN) 1 % GEL [id number] Yes Apply 2 g topically 4 (four) times daily. Up to 4 joints Hali Marry, MD Taking Active   ferrous sulfate 324 MG TBEC [id number] Yes Take 1 tablet by mouth daily with breakfast. [provider] Taking Active   furosemide (LASIX) 20 MG tablet [id number] Yes TAKE ONE TABLET BY MOUTH ONCE DAILY AS NEEDED Hali Marry, MD Taking Active   metFORMIN (GLUCOPHAGE-XR) 500 MG 24 hr tablet [id number] Yes TAKE ONE TABLET ($RemoveBef'500MG'TOdLLuKGHd$  TOTAL) BY MOUTH DAILY WITH BREAKFAST Hali Marry, MD Taking Active   Multiple Vitamin (MULTIVITAMIN) capsule [id number] Yes Take 1 capsule by mouth daily. [provider] Taking Active   Omega-3 Fatty Acids (FISH OIL) 1000 MG CAPS [id number] Yes Take 1,000 mg by mouth daily. [provider] Taking Active    pantoprazole (PROTONIX) 40 MG tablet [id number] Yes TAKE ONE TABLET (40 MG TOTAL) BY MOUTH DAILY Hali Marry, MD Taking Active   simvastatin (ZOCOR) 40 MG tablet [id number] Yes Take 1 tablet (40 mg total) by mouth daily. Hali Marry, MD Taking Active             Patient Active Problem List   Diagnosis Date Noted   Lower extremity edema 02/11/2022   Venous stasis dermatitis of both lower extremities 06/20/2021   Acute bilateral low back pain without sciatica 10/05/2020   DDD (degenerative disc disease), lumbar 10/05/2020   Iron deficiency anemia due to chronic blood loss 04/23/2020   Pancreatic lesion 04/23/2020   Weakness of both lower extremities 04/23/2020   H/O congenital atrial septal defect (ASD) repair 03/22/2020   BRBPR (bright red blood per rectum) 05/05/2019   Nocturnal hypoxia 06/11/2018   Aortic atherosclerosis ([place]) 03/30/2017   Hemoglobin low 03/04/2017   Scoliosis of thoracic spine 12/04/2016   Primary osteoarthritis of left shoulder 07/07/2016   Pseudophakia of both eyes 04/22/2016   Pain and swelling of lower extremity 12/31/2015   Arthrofibrosis of knee joint, left 02/01/2014   OSA on CPAP 10/12/2013   PVD (peripheral vascular disease) ([place]) 03/31/2012   Diabetes mellitus type 2 in obese ([place]) 11/08/2010   ARTHRITIS, CERVICAL SPINE 05/08/2009   History of total knee arthroplasty, left 03/02/2008   Lumbar radiculopathy 12/29/2007   ANKLE EDEMA 12/29/2007   ESOPHAGEAL STRICTURE 08/18/2007   GERD 07/19/2007   HAIR LOSS 04/19/2007   History of total left hip arthroplasty 04/19/2007   Pulmonary hypertension ([place]) 12/17/2006   Scoliosis 12/17/2006   VALVULAR HEART DISEASE 08/14/2006   Hyperlipidemia 04/10/2006    Immunization History  Administered Date(s) Administered   Fluad Quad(high Dose 65+) 12/27/2018, 01/11/2020, 06/20/2021, 02/11/2022   Influenza Whole 04/02/2006, 04/19/2007   Influenza, High Dose Seasonal PF 12/24/2016,  02/18/2018   Influenza, Seasonal, Injecte, Preservative Fre 03/31/2012   Influenza,inj,Quad PF,6+ Mos 02/28/2013, 03/02/2014, 03/20/2015, 12/24/2015   PFIZER(Purple Top)SARS-COV-2 Vaccination 07/06/2019, 07/27/2019   Pneumococcal Conjugate-13 11/17/2014   Pneumococcal Polysaccharide-23 04/02/2006, 07/01/2012   Tdap 10/29/2010   Zoster, Live 12/23/2010  Conditions to be addressed/monitored: HLD and DMII  There are no care plans that you recently modified to display for this patient.    Medication Assistance: None required.  Patient affirms current coverage meets needs.  Patient's preferred pharmacy is:  [place], [place] [facility] [address] [place] [address] Phone: [phone] Fax: [phone]  Uses pill box? No - puts medicine into one single bottle, works well for her she states Pt endorses 100% compliance  Follow Up:  Patient agrees to Care Plan and Follow-up.  Plan: Telephone follow up appointment with care management team member scheduled for:  3 months  Larinda Buttery, PharmD Clinical Pharmacist [facility] [phone]

## 2022-02-24 LAB — HM DIABETES EYE EXAM

## 2022-03-04 ENCOUNTER — Other Ambulatory Visit: Payer: Self-pay | Admitting: Family Medicine

## 2022-03-13 ENCOUNTER — Telehealth: Payer: Medicare PPO

## 2022-03-13 ENCOUNTER — Telehealth: Payer: Self-pay | Admitting: Pharmacist

## 2022-03-13 NOTE — Progress Notes (Incomplete)
Chronic Care Management Pharmacy Note  03/13/2022 Name:  Shelby Thomas MRN:  [id number] DOB:  Jan 26, 1946  Summary: addressed DM, HLD. Patient recently had elevated d-dimer, though negative for dvt on doppler imaging. She started furosemide for excessive bilateral lower extremity edema, and is 3 days into her RX.  A1c sharp uptrend (6.1 to 9.4), she attributes to poor diet.   Recommendations/Changes made from today's visit: recommend patient adjust her nutrition habits, as she declines any  medication changes at this time related to recent uncontrolled a1c.    Plan: f/u with pharmacist in 3 months to reassess a1c and diabetes regimen.  Subjective: [name] is an 76 y.o. year old female who is a primary patient of Metheney, Rene Kocher, MD.  The CCM team was consulted for assistance with disease management and care coordination needs.    Engaged with patient by telephone for follow up visit in response to provider referral for pharmacy case management and/or care coordination services.   Consent to Services:  The patient was given information about Chronic Care Management services, agreed to services, and gave verbal consent prior to initiation of services.  Please see initial visit note for detailed documentation.   Patient Care Team: Hali Marry, MD as PCP - General (Family Medicine) Early, Coralee Pesa, NP as Nurse Practitioner (Nurse Practitioner) Darius Bump, [facility] as Pharmacist (Pharmacist)  Recent office visits:  11/19/20 Hali Marry MD - Seen for diabetes - Labs ordered - No medication changes noted - Follow up in 6 months  10/03/20 Iran Planas PA - Seen for acute bilateral low back pain - X Ray of lumbar ordered - Start prednisone 50 mg, 1 tablet for 5 days - Start methocarbamol 500 mg tablet, 1 tablet by mouth 3 times daily - No follow up noted    Recent consult visits:  None noted    Hospital visits:  None in previous 6 months  Objective:  Lab  Results  Component Value Date   CREATININE 0.83 11/06/2021   CREATININE 0.80 10/23/2021   CREATININE 0.67 03/06/2021    Lab Results  Component Value Date   HGBA1C 7.4 (A) 02/11/2022   Last diabetic Eye exam:  Lab Results  Component Value Date/Time   HMDIABEYEEXA No Retinopathy 01/02/2021 12:00 AM          Component Value Date/Time   CHOL 197 03/06/2021 0000   TRIG 92 03/06/2021 0000   HDL 74 03/06/2021 0000   CHOLHDL 2.7 03/06/2021 0000   VLDL 17 03/03/2016 1128   LDLCALC 104 (H) 03/06/2021 0000   LDLDIRECT 105 (H) 11/23/2008 2232       Latest Ref Rng & Units 10/23/2021   12:00 AM 03/06/2021   12:00 AM 12/21/2019   12:04 PM  Hepatic Function  Total Protein 6.1 - 8.1 g/dL 7.6  7.2  7.1   AST 10 - 35 U/L _0 ALT 6 - 29 U/L _1 Total Bilirubin 0.2 - 1.2 mg/dL 0.6  0.5  0.4     Lab Results  Component Value Date/Time   TSH 0.78 11/25/2017 11:12 AM   TSH 0.88 12/24/2015 02:58 PM       Latest Ref Rng & Units 10/23/2021   12:00 AM 03/06/2021   12:00 AM 03/28/2020    1:31 PM  CBC  WBC 3.8 - 10.8 Thousand/uL 6.2  4.3    Hemoglobin 11.7 - 15.5 g/dL 12.6  13.8  7.4   Hematocrit 35.0 - 45.0 % 40.9  42.1    Platelets 140 - 400 Thousand/uL 266  246      No results found for: "VD25OH"  Clinical ASCVD: Yes  The 10-year ASCVD risk score (Arnett DK, et al., 2019) is: 41.1%   Values used to calculate the score:     Age: 76 years     Sex: Female     Is Non-Hispanic African American: Yes     Diabetic: Yes     Tobacco smoker: No     Systolic Blood Pressure: 322 mmHg     Is BP treated: Yes     HDL Cholesterol: 74 mg/dL     Total Cholesterol: 197 mg/dL    Other: (CHADS2VASc if Afib, PHQ9 if depression, MMRC or CAT for COPD, ACT, DEXA)  Social History   Tobacco Use  Smoking Status Never  Smokeless Tobacco Never   BP Readings from Last 3 Encounters:  02/11/22 (!) 144/57  11/06/21 (!) 128/46  10/23/21 (!) 137/49   Pulse Readings from Last 3  Encounters:  02/11/22 95  11/06/21 83  10/23/21 92   Wt Readings from Last 3 Encounters:  11/06/21 206 lb (93.4 kg)  10/23/21 214 lb (97.1 kg)  06/20/21 196 lb (88.9 kg)    Assessment: Review of patient past medical history, allergies, medications, health status, including review of consultants reports, laboratory and other test data, was performed as part of comprehensive evaluation and provision of chronic care management services.   SDOH:  (Social Determinants of Health) assessments and interventions performed:  SDOH Interventions    Flowsheet Row Office Visit from 02/11/2022 in [place] Office Visit from 11/06/2021 in [place] Office Visit from 10/23/2021 in [place] Office Visit from 08/05/2021 in [place] Visit from 07/23/2020 in [place] Visit from 12/21/2019 in [place] Interventions -- -- -- -- Intervention Not Indicated --  Housing Interventions -- -- -- Intervention Not Indicated Intervention Not Indicated --  Transportation Interventions -- -- -- -- Intervention Not Indicated --  Depression Interventions/Treatment  PHQ2-9 Score <4 Follow-up Not Indicated PHQ2-9 Score <4 Follow-up Not Indicated PHQ2-9 Score <4 Follow-up Not Indicated -- -- Counseling  Financial Strain Interventions -- -- -- -- Intervention Not Indicated --  Physical Activity Interventions -- -- -- Intervention Not Indicated Intervention Not Indicated --  Stress Interventions -- -- -- -- Intervention Not Indicated --  Social Connections Interventions -- -- -- Intervention Not Indicated Intervention Not Indicated --       CCM Care Plan  Allergies  Allergen Reactions   Ramipril     REACTION: cough   Tramadol Other (See  Comments)    Nausea    Medications Reviewed Today     Reviewed by Annamaria Helling, CMA (Certified Medical Assistant) on 02/11/22 at [place] List Status: <None>   Medication Order Taking? Sig Documenting Provider Last Dose Status Informant  AMBULATORY NON FORMULARY MEDICATION [id number] Yes Medication Name: Oxygen Tubing for overnight O2. G47.34 I27.20 Fax:[phone] Hali Marry, MD Taking Active   ascorbic acid (VITAMIN C) 250 MG CHEW [id number] Yes Chew 250 mg by mouth daily. [provider] Taking Active   blood glucose meter kit and  supplies [id number] Yes Dispense based on patient and insurance preference. Use daily as directed. E11.69 Hali Marry, MD Taking Active   calcium citrate (CALCITRATE - DOSED IN MG ELEMENTAL CALCIUM) 950 (200 Ca) MG tablet [id number] Yes Take 200 mg of elemental calcium by mouth daily. [provider] Taking Active   chlorthalidone (HYGROTON) 25 MG tablet [id number] Yes Take 1 tablet (25 mg total) by mouth daily as needed. For swelling in legs Hali Marry, MD Taking Active   diclofenac Sodium (VOLTAREN) 1 % GEL [id number] Yes Apply 2 g topically 4 (four) times daily. Up to 4 joints Hali Marry, MD Taking Active   ferrous sulfate 324 MG TBEC [id number] Yes Take 1 tablet by mouth daily with breakfast. [provider] Taking Active   furosemide (LASIX) 20 MG tablet [id number] Yes TAKE ONE TABLET BY MOUTH ONCE DAILY AS NEEDED Hali Marry, MD Taking Active   metFORMIN (GLUCOPHAGE-XR) 500 MG 24 hr tablet [id number] Yes TAKE ONE TABLET (500MG TOTAL) BY MOUTH DAILY WITH BREAKFAST Hali Marry, MD Taking Active   Multiple Vitamin (MULTIVITAMIN) capsule [id number] Yes Take 1 capsule by mouth daily. [provider] Taking Active   Omega-3 Fatty Acids (FISH OIL) 1000 MG CAPS [id number] Yes Take 1,000 mg by mouth daily. [provider] Taking Active   pantoprazole (PROTONIX) 40 MG  tablet [id number] Yes TAKE ONE TABLET (40 MG TOTAL) BY MOUTH DAILY Hali Marry, MD Taking Active   simvastatin (ZOCOR) 40 MG tablet [id number] Yes Take 1 tablet (40 mg total) by mouth daily. Hali Marry, MD Taking Active             Patient Active Problem List   Diagnosis Date Noted   Lower extremity edema 02/11/2022   Venous stasis dermatitis of both lower extremities 06/20/2021   Acute bilateral low back pain without sciatica 10/05/2020   DDD (degenerative disc disease), lumbar 10/05/2020   Iron deficiency anemia due to chronic blood loss 04/23/2020   Pancreatic lesion 04/23/2020   Weakness of both lower extremities 04/23/2020   H/O congenital atrial septal defect (ASD) repair 03/22/2020   BRBPR (bright red blood per rectum) 05/05/2019   Nocturnal hypoxia 06/11/2018   Aortic atherosclerosis ([place]) 03/30/2017   Hemoglobin low 03/04/2017   Scoliosis of thoracic spine 12/04/2016   Primary osteoarthritis of left shoulder 07/07/2016   Pseudophakia of both eyes 04/22/2016   Pain and swelling of lower extremity 12/31/2015   Arthrofibrosis of knee joint, left 02/01/2014   OSA on CPAP 10/12/2013   PVD (peripheral vascular disease) ([place]) 03/31/2012   Diabetes mellitus type 2 in obese ([place]) 11/08/2010   ARTHRITIS, CERVICAL SPINE 05/08/2009   History of total knee arthroplasty, left 03/02/2008   Lumbar radiculopathy 12/29/2007   ANKLE EDEMA 12/29/2007   ESOPHAGEAL STRICTURE 08/18/2007   GERD 07/19/2007   HAIR LOSS 04/19/2007   History of total left hip arthroplasty 04/19/2007   Pulmonary hypertension ([place]) 12/17/2006   Scoliosis 12/17/2006   VALVULAR HEART DISEASE 08/14/2006   Hyperlipidemia 04/10/2006    Immunization History  Administered Date(s) Administered   Fluad Quad(high Dose 65+) 12/27/2018, 01/11/2020, 06/20/2021, 02/11/2022   Influenza Whole 04/02/2006, 04/19/2007   Influenza, High Dose Seasonal PF 12/24/2016, 02/18/2018   Influenza, Seasonal,  Injecte, Preservative Fre 03/31/2012   Influenza,inj,Quad PF,6+ Mos 02/28/2013, 03/02/2014, 03/20/2015, 12/24/2015   PFIZER(Purple Top)SARS-COV-2 Vaccination 07/06/2019, 07/27/2019   Pneumococcal Conjugate-13 11/17/2014   Pneumococcal Polysaccharide-23 04/02/2006, 07/01/2012   Tdap 10/29/2010   Zoster, Live 12/23/2010  Conditions to be addressed/monitored: HLD and DMII  There are no care plans that you recently modified to display for this patient.    Medication Assistance: None required.  Patient affirms current coverage meets needs.  Patient's preferred pharmacy is:  [place] [place] [facility] [address] [place] [address] Phone: [phone] Fax: [phone]  Uses pill box? No - puts medicine into one single bottle, works well for her she states Pt endorses 100% compliance  Follow Up:  Patient agrees to Care Plan and Follow-up.  Plan: Telephone follow up appointment with care management team member scheduled for:  3 months  Larinda Buttery, PharmD Clinical Pharmacist [facility] [phone]

## 2022-03-13 NOTE — Telephone Encounter (Signed)
Unsuccessful attempt x2 to contact patient for follow-up phone call for CCM services with pharmacist. Patient answered phone, but was having difficulties hearing, saying hello, and kept hanging up. Completed technology/sound check to ensure there was not a problem with my line.  Will route to schedule team for reschedule.  Lynnda Shields, PharmD Clinical Pharmacist [facility] [phone]

## 2022-03-25 ENCOUNTER — Telehealth: Payer: Self-pay

## 2022-03-25 NOTE — Progress Notes (Signed)
  Care Coordination Note  03/25/2022 Name: JACLYNNE BALDO MRN: [id number] DOB: 26-Mar-1946  [name] is a 76 y.o. year old female who is a primary care patient of Agapito Games, MD and is actively engaged with the care management team. I reached out to [name] by phone today to assist with re-scheduling a follow up visit with the Pharmacist  Follow up plan: Unsuccessful telephone outreach attempt made. A HIPAA compliant phone message was left for the patient providing contact information and requesting a return call.  The care management team will reach out to the patient again over the next 7 days.  If patient returns call to provider office, please advise to  [facility] Care Guide Penne Lash  at [phone]  Penne Lash, RMA Care Guide [facility]  [place], [place] [address] Direct Dial: [phone] [email or web address]

## 2022-04-01 ENCOUNTER — Telehealth: Payer: Self-pay | Admitting: *Deleted

## 2022-04-01 NOTE — Telephone Encounter (Signed)
Pt called and stated that her husband recently passed. And her insurance is requiring a letter from her PCP to clarify her state of health. She stated that her husband was her caregiver and she needs this letter because she is disabled.   I asked if someone could bring a letter by office so that Dr. Linford Arnold can review exactly what is needed to be sent to her insurance company. I told her that we would make a copy and return the original back to her. She voiced understanding and agreed to this.

## 2022-04-04 ENCOUNTER — Telehealth: Payer: Self-pay

## 2022-04-04 NOTE — Telephone Encounter (Signed)
Patient has IRA form dropped off at office concerning her deceased spouse, form placed in [employer] box.

## 2022-04-07 NOTE — Progress Notes (Signed)
  Care Coordination Note  04/07/2022 Name: Shelby Thomas MRN: [id number] DOB: 1945-05-19  [name] is a 76 y.o. year old female who is a primary care patient of Agapito Games, MD and is actively engaged with the care management team. I reached out to [name] by phone today to assist with re-scheduling a follow up visit with the Pharmacist  Follow up plan: Unsuccessful telephone outreach attempt made. A HIPAA compliant phone message was left for the patient providing contact information and requesting a return call.  The care management team will reach out to the patient again over the next 7 days.  If patient returns call to provider office, please advise to call CCM Care Guide Penne Lash  at [phone]  Penne Lash, RMA Care Guide [facility]  [place], [place] [address] Direct Dial: [phone] [email or web address]

## 2022-04-09 ENCOUNTER — Encounter: Payer: Self-pay | Admitting: Family Medicine

## 2022-04-14 NOTE — Progress Notes (Signed)
  Care Coordination Note  04/14/2022 Name: [name] MRN: [id number] DOB: Apr 12, 1946  Shelby Thomas is a 76 y.o. year old female who is a primary care patient of Agapito Games, MD and is actively engaged with the care management team. I reached out to Shelby Thomas by phone today to assist with re-scheduling a follow up visit with the Pharmacist  Follow up plan: Unable to make contact on outreach attempts x 3. PCP Agapito Games, MD notified via routed documentation in medical record.   Penne Lash, RMA Care Guide [facility]  [place], [place] [address] Direct Dial: [phone] [email or web address]

## 2022-04-17 ENCOUNTER — Other Ambulatory Visit: Payer: Self-pay | Admitting: Family Medicine

## 2022-04-17 NOTE — Telephone Encounter (Signed)
Letter mailed to patient.

## 2022-05-14 ENCOUNTER — Encounter: Payer: Self-pay | Admitting: Family Medicine

## 2022-05-14 ENCOUNTER — Ambulatory Visit: Payer: Medicare PPO | Admitting: Family Medicine

## 2022-05-14 VITALS — BP 134/67 | HR 84 | Ht 64.0 in | Wt 202.0 lb

## 2022-05-14 DIAGNOSIS — E669 Obesity, unspecified: Secondary | ICD-10-CM | POA: Diagnosis not present

## 2022-05-14 DIAGNOSIS — I739 Peripheral vascular disease, unspecified: Secondary | ICD-10-CM | POA: Diagnosis not present

## 2022-05-14 DIAGNOSIS — I272 Pulmonary hypertension, unspecified: Secondary | ICD-10-CM

## 2022-05-14 DIAGNOSIS — R600 Localized edema: Secondary | ICD-10-CM

## 2022-05-14 DIAGNOSIS — F4321 Adjustment disorder with depressed mood: Secondary | ICD-10-CM | POA: Diagnosis not present

## 2022-05-14 DIAGNOSIS — R29898 Other symptoms and signs involving the musculoskeletal system: Secondary | ICD-10-CM

## 2022-05-14 DIAGNOSIS — E1169 Type 2 diabetes mellitus with other specified complication: Secondary | ICD-10-CM

## 2022-05-14 DIAGNOSIS — I700 Atherosclerosis of aorta: Secondary | ICD-10-CM

## 2022-05-14 LAB — POCT GLYCOSYLATED HEMOGLOBIN (HGB A1C): Hemoglobin A1C: 8.2 % — AB (ref 4.0–5.6)

## 2022-05-14 MED ORDER — METFORMIN HCL ER 500 MG PO TB24: 1000.0000 mg | ORAL_TABLET | Freq: Two times a day (BID) | ORAL | 3 refills | Status: DC

## 2022-05-14 NOTE — Progress Notes (Signed)
Established Patient Office Visit  Subjective   Patient ID: Shelby Thomas, female    DOB: 08-12-1945  Age: 77 y.o. MRN: [id number]  No chief complaint on file.   HPI  Her husband passed away pretty suddenly in November.  Her husband was her caretaker  He had a few days where he did not feel well and then started having some facial drooping and I thought maybe he had a stroke.  He had extensive workup and it was negative but he just continued to decline over about a week's time and then suddenly passed.  Diabetes - no hypoglycemic events. No wounds or sores that are not healing well. No increased thirst or urination. Checking glucose at home. Taking medications as prescribed without any side effects. She hasn't been eating that well.    She says her lower extremity swelling is actually good right now she has not had any major issues but she has not been able to find her compression stockings.    ROS    Objective:     BP 134/67   Pulse 84   Ht 5\' 4"  (1.626 m)   Wt 202 lb (91.6 kg)   SpO2 93%   BMI 34.67 kg/m    Physical Exam Vitals and nursing note reviewed.  Constitutional:      Appearance: She is well-developed.  HENT:     Head: Normocephalic and atraumatic.  Cardiovascular:     Rate and Rhythm: Normal rate and regular rhythm.     Heart sounds: Normal heart sounds.  Pulmonary:     Effort: Pulmonary effort is normal.     Breath sounds: Normal breath sounds.  Skin:    General: Skin is warm and dry.  Neurological:     Mental Status: She is alert and oriented to person, place, and time.  Psychiatric:        Behavior: Behavior normal.      Results for orders placed or performed in visit on 05/14/22  POCT glycosylated hemoglobin (Hb A1C)  Result Value Ref Range   Hemoglobin A1C 8.2 (A) 4.0 - 5.6 %   HbA1c POC (<> result, manual entry)     HbA1c, POC (prediabetic range)     HbA1c, POC (controlled diabetic range)        The 10-year ASCVD risk score  (Arnett DK, et al., 2019) is: 40.5%    Assessment & Plan:   Problem List Items Addressed This Visit       Cardiovascular and Mediastinum   PVD (peripheral vascular disease) ([place])    No specific concerns today.  Lower extremity swelling is actually doing well right now.      Pulmonary hypertension (HCC)    Currently asymptomatic.  No increase in shortness of breath.      Aortic atherosclerosis (HCC)    Continue simvastatin daily.        Endocrine   Diabetes mellitus type 2 in obese ([facility]) - Primary     A1c went up to 8.2.  Her diet has been a lot of at all over the place right now.  Work up at the metformin to 2 tabs follow back up in 3 months and just continue work on good food choices.      Relevant Medications   metFORMIN (GLUCOPHAGE-XR) 500 MG 24 hr tablet   Other Relevant Orders   POCT glycosylated hemoglobin (Hb A1C) (Completed)     Other   Weakness of both lower extremities    Wheelchair  bound.       Lower extremity edema    Improving.      Other Visit Diagnoses     Grief           Grief-just encouraged her to reach out if at any point she feels like she is not doing well or struggling we can always get her in for therapy or counseling.  Her 2 sons have been very helpful and supportive.  She does not drive.  Return in about 3 months (around 08/13/2022) for Diabetes follow-up and labs.    Beatrice Lecher, MD

## 2022-05-14 NOTE — Assessment & Plan Note (Signed)
Wheelchair bound 

## 2022-05-14 NOTE — Assessment & Plan Note (Signed)
A1c went up to 8.2.  Her diet has been a lot of at all over the place right now.  Work up at the metformin to 2 tabs follow back up in 3 months and just continue work on good food choices.

## 2022-05-14 NOTE — Assessment & Plan Note (Signed)
Currently asymptomatic.  No increase in shortness of breath.

## 2022-05-14 NOTE — Assessment & Plan Note (Signed)
Continue simvastatin daily

## 2022-05-14 NOTE — Patient Instructions (Signed)
Recommend that you get your tetanus vaccine and shingles vaccine at the pharmacy.  With your Medicare part D they should be free.  I would recommend doing them on different days.  Continue to work on [employer].  Will plan to do blood work and urine test when you come back next time.

## 2022-05-14 NOTE — Assessment & Plan Note (Signed)
No specific concerns today.  Lower extremity swelling is actually doing well right now.

## 2022-05-14 NOTE — Assessment & Plan Note (Signed)
Improving.

## 2022-08-11 ENCOUNTER — Ambulatory Visit (INDEPENDENT_AMBULATORY_CARE_PROVIDER_SITE_OTHER): Payer: Medicare PPO | Admitting: Family Medicine

## 2022-08-11 DIAGNOSIS — Z0000 Encounter for general adult medical examination without abnormal findings: Secondary | ICD-10-CM

## 2022-08-11 DIAGNOSIS — Z780 Asymptomatic menopausal state: Secondary | ICD-10-CM | POA: Diagnosis not present

## 2022-08-11 NOTE — Progress Notes (Signed)
MEDICARE ANNUAL WELLNESS VISIT  08/11/2022  Telephone Visit Disclaimer This Medicare AWV was conducted by telephone due to national recommendations for restrictions regarding the COVID-19 Pandemic (e.g. social distancing).  I verified, using two identifiers, that I am speaking with [name] or their authorized healthcare agent. I discussed the limitations, risks, security, and privacy concerns of performing an evaluation and management service by telephone and the potential availability of an in-person appointment in the future. The patient expressed understanding and agreed to proceed.  Location of Patient: Home Location of Provider (nurse):  In the office.  Subjective:    DEREK HUNEYCUTT is a 77 y.o. female patient of Metheney, Barbarann Ehlers, MD who had a Medicare Annual Wellness Visit today via telephone. Monay is Retired and lives with their son. she has 2 children. she reports that she is socially active and does interact with friends/family regularly. she is minimally physically active and enjoys working on tablet and doing puzzles.  Patient Care Team: Agapito Games, MD as PCP - General (Family Medicine) Early, Sung Amabile, NP as Nurse Practitioner (Nurse Practitioner) Gabriel Carina, [facility] as Pharmacist (Pharmacist)     08/11/2022    3:44 PM 08/05/2021    3:24 PM 07/23/2020   11:02 AM 03/02/2014   11:20 AM 07/29/2013   11:00 AM  Advanced Directives  Does Patient Have a Medical Advance Directive? No No No No Patient does not have advance directive;Patient would like information  Would patient like information on creating a medical advance directive? No - Patient declined No - Patient declined No - Patient declined No - patient declined information Advance directive packet given    Hospital Utilization Over the Past 12 Months: # of hospitalizations or ER visits: 0 # of surgeries: 0  Review of Systems    Patient reports that her overall health is better compared to last  year.  History obtained from chart review and the patient  Patient Reported Readings (BP, Pulse, CBG, Weight, etc) none  Pain Assessment Pain : No/denies pain     Current Medications & Allergies (verified) Allergies as of 08/11/2022       Reactions   Ramipril    REACTION: cough   Tramadol Other (See Comments)   Nausea        Medication List        Accurate as of August 11, 2022  3:56 PM. If you have any questions, ask your nurse or doctor.          AMBULATORY NON FORMULARY MEDICATION Medication Name: Oxygen Tubing for overnight O2. G47.34 I27.20 [phone]   ascorbic acid 250 MG Chew Commonly known as: VITAMIN C Chew 250 mg by mouth daily.   blood glucose meter kit and supplies Dispense based on patient and insurance preference. Use daily as directed. E11.69   calcium citrate 950 (200 Ca) MG tablet Commonly known as: CALCITRATE - dosed in mg elemental calcium Take 200 mg of elemental calcium by mouth daily.   diclofenac Sodium 1 % Gel Commonly known as: Voltaren Apply 2 g topically 4 (four) times daily. Up to 4 joints   ferrous sulfate 324 MG Tbec Take 1 tablet by mouth daily with breakfast.   Fish Oil 1000 MG Caps Take 1,000 mg by mouth daily.   metFORMIN 500 MG 24 hr tablet Commonly known as: GLUCOPHAGE-XR Take 2 tablets (1,000 mg total) by mouth 2 (two) times daily with a meal.   multivitamin capsule Take 1 capsule by mouth  daily.   pantoprazole 40 MG tablet Commonly known as: PROTONIX TAKE ONE TABLET (40 MG TOTAL) BY MOUTH DAILY   simvastatin 40 MG tablet Commonly known as: ZOCOR TAKE ONE TABLET BY MOUTH ONCE DAILY        History (reviewed): Past Medical History:  Diagnosis Date   CHF (congestive heart failure)    mild   Diabetes mellitus    Foraminal stenosis of lumbar region    L4--L5   GERD (gastroesophageal reflux disease)    Heart disease, congenital    large ASD, Sees Dr. Elmarie ShileyGoodfellow/card   Hiatal hernia     Hyperlipidemia    dyslipidemia   Hypertension    pulmonary/on nighttime O2   MVRCS association    moderate to severe   OSA (obstructive sleep apnea)    Pneumonia 2006   Scoliosis    severe   Spine misalignment    T spine 80% to right, L-Spine 55% to left   Past Surgical History:  Procedure Laterality Date   CARDIAC SURGERY     repair of ASD   ESOPHAGEAL DILATION  04-09   dilation/Dr. Inge Riseavid Wood   REPLACEMENT TOTAL KNEE Left 12/08/13   Dr. Dossie Arbouralvin McCabe   TOTAL HIP ARTHROPLASTY Left    Family History  Problem Relation Age of Onset   Diabetes Mother    Stroke Mother    CAD Mother        CABG in her 870s   Social History   Socioeconomic History   Marital status: Widowed    Spouse name: Ian BushmanVirgil   Number of children: 2   Years of education: 14.5   Highest education level: Some college, no degree  Occupational History    Comment: Retired.  Tobacco Use   Smoking status: Never   Smokeless tobacco: Never  Vaping Use   Vaping Use: Never used  Substance and Sexual Activity   Alcohol use: No   Drug use: Never   Sexual activity: Not on file  Other Topics Concern   Not on file  Social History Narrative   Lives alone. Recently widowed. Her son moved in with her. She works on her tablet and does puzzles.   Social Determinants of Health   Financial Resource Strain: Low Risk  (08/11/2022)   Overall Financial Resource Strain (CARDIA)    Difficulty of Paying Living Expenses: Not hard at all  Food Insecurity: No Food Insecurity (08/11/2022)   Hunger Vital Sign    Worried About Running Out of Food in the Last Year: Never true    Ran Out of Food in the Last Year: Never true  Transportation Needs: No Transportation Needs (08/11/2022)   PRAPARE - Administrator, Civil ServiceTransportation    Lack of Transportation (Medical): No    Lack of Transportation (Non-Medical): No  Physical Activity: Inactive (08/11/2022)   Exercise Vital Sign    Days of Exercise per Week: 0 days    Minutes of Exercise per Session: 0 min   Stress: No Stress Concern Present (08/11/2022)   [employer] - Occupational Stress Questionnaire    Feeling of Stress : Not at all  Social Connections: Moderately Isolated (08/11/2022)   Social Connection and Isolation Panel [NHANES]    Frequency of Communication with Friends and Family: More than three times a week    Frequency of Social Gatherings with Friends and Family: More than three times a week    Attends Religious Services: More than 4 times per year    Active Member of  Clubs or Organizations: No    Attends Banker Meetings: Never    Marital Status: Widowed    Activities of Daily Living    08/11/2022    3:47 PM  In your present state of health, do you have any difficulty performing the following activities:  Hearing? 0  Vision? 0  Difficulty concentrating or making decisions? 0  Walking or climbing stairs? 1  Comment knee pain  Dressing or bathing? 0  Comment has a shower chair  Doing errands, shopping? 1  Comment she usually goes with her son  Quarry manager and eating ? Y  Comment not able to prepare food; son usually prepares or eats out  Using the Toilet? N  In the past six months, have you accidently leaked urine? N  Do you have problems with loss of bowel control? N  Managing your Medications? N  Managing your Finances? N  Housekeeping or managing your Housekeeping? Y  Comment not able to due to knee pain; son helps    Patient Education/ Literacy How often do you need to have someone help you when you read instructions, pamphlets, or other written materials from your doctor or pharmacy?: 1 - Never What is the last grade level you completed in school?: 2 years of college  Exercise Current Exercise Habits: The patient does not participate in regular exercise at present, Exercise limited by: orthopedic condition(s)  Diet Patient reports consuming 3 meals a day and 2 snack(s) a day Patient reports that her primary diet  is: Regular Patient reports that she does have regular access to food.   Depression Screen    08/11/2022    3:44 PM 02/11/2022   11:17 AM 08/05/2021    3:25 PM 06/20/2021   10:53 AM 11/19/2020   11:16 AM 07/23/2020   11:03 AM 12/21/2019   11:36 AM  PHQ 2/9 Scores  PHQ - 2 Score 0 0 0 0 0 0 6  PHQ- 9 Score       27     Fall Risk    08/11/2022    3:44 PM 05/14/2022   10:10 AM 02/11/2022   11:17 AM 10/23/2021    2:30 PM 08/05/2021    3:24 PM  Fall Risk   Falls in the past year? 0 0 0 0 1  Number falls in past yr: 0 0 0 0 0  Injury with Fall? 0 0 0 0 0  Risk for fall due to : No Fall Risks Impaired mobility No Fall Risks No Fall Risks Impaired mobility;Impaired balance/gait  Follow up Falls evaluation completed Falls evaluation completed Falls evaluation completed Falls prevention discussed;Falls evaluation completed Falls evaluation completed     Objective:  [name] seemed alert and oriented and she participated appropriately during our telephone visit.  Blood Pressure Weight BMI  BP Readings from Last 3 Encounters:  05/14/22 134/67  02/11/22 (!) 144/57  11/06/21 (!) 128/46   Wt Readings from Last 3 Encounters:  05/14/22 202 lb (91.6 kg)  11/06/21 206 lb (93.4 kg)  10/23/21 214 lb (97.1 kg)   BMI Readings from Last 1 Encounters:  05/14/22 34.67 kg/m    *Unable to obtain current vital signs, weight, and BMI due to telephone visit type  Hearing/Vision  Rubena did not seem to have difficulty with hearing/understanding during the telephone conversation Reports that she has had a formal eye exam by an eye care professional within the past year Reports that she has not had a formal hearing  evaluation within the past year *Unable to fully assess hearing and vision during telephone visit type  Cognitive Function:    08/11/2022    3:50 PM 08/05/2021    3:32 PM 07/23/2020   11:11 AM  6CIT Screen  What Year? 0 points 0 points 0 points  What month? 0 points 0 points 0 points   What time? 0 points 0 points 0 points  Count back from 20 0 points 0 points 2 points  Months in reverse 0 points 0 points 0 points  Repeat phrase 2 points 2 points 2 points  Total Score 2 points 2 points 4 points   (Normal:0-7, Significant for Dysfunction: >8)  Normal Cognitive Function Screening: Yes   Immunization & Health Maintenance Record Immunization History  Administered Date(s) Administered   Fluad Quad(high Dose 65+) 12/27/2018, 01/11/2020, 06/20/2021, 02/11/2022   Influenza Whole 04/02/2006, 04/19/2007   Influenza, High Dose Seasonal PF 12/24/2016, 02/18/2018   Influenza, Seasonal, Injecte, Preservative Fre 03/31/2012   Influenza,inj,Quad PF,6+ Mos 02/28/2013, 03/02/2014, 03/20/2015, 12/24/2015   PFIZER(Purple Top)SARS-COV-2 Vaccination 07/06/2019, 07/27/2019   Pneumococcal Conjugate-13 11/17/2014   Pneumococcal Polysaccharide-23 04/02/2006, 07/01/2012   Tdap 10/29/2010   Zoster, Live 12/23/2010    Health Maintenance  Topic Date Due   DTaP/Tdap/Td (2 - Td or Tdap) 10/28/2020   FOOT EXAM  08/14/2022 (Originally 06/20/2022)   Zoster Vaccines- Shingrix (1 of 2) 11/10/2022 (Originally 02/27/1997)   COVID-19 Vaccine (3 - 2023-24 season) 05/31/2023 (Originally 01/03/2022)   Diabetic kidney evaluation - Urine ACR  10/24/2022   Diabetic kidney evaluation - eGFR measurement  11/07/2022   HEMOGLOBIN A1C  11/12/2022   INFLUENZA VACCINE  12/04/2022   OPHTHALMOLOGY EXAM  02/25/2023   Medicare Annual Wellness (AWV)  08/11/2023   COLONOSCOPY (Pts 45-38yrs Insurance coverage will need to be confirmed)  02/17/2024   Pneumonia Vaccine 74+ Years old  Completed   DEXA SCAN  Completed   Hepatitis C Screening  Completed   HPV VACCINES  Aged Out       Assessment  This is a routine wellness examination for [employer].  Health Maintenance: Due or Overdue Health Maintenance Due  Topic Date Due   DTaP/Tdap/Td (2 - Td or Tdap) 10/28/2020    [name] does not need a  referral for Community Assistance: Care Management:   no Social Work:    no Prescription Assistance:  no Nutrition/Diabetes Education:  no   Plan:  Personalized Goals  Goals Addressed               This Visit's Progress     Patient Stated (pt-stated)        Patient stated that she would like to be able to walk better.       Personalized Health Maintenance & Screening Recommendations  Td vaccine Bone densitometry screening Shingrix vaccine Foot exam  Lung Cancer Screening Recommended: no (Low Dose CT Chest recommended if Age 59-80 years, 30 pack-year currently smoking OR have quit w/in past 15 years) Hepatitis C Screening recommended: no HIV Screening recommended: no  Advanced Directives: Written information was not prepared per patient's request.  Referrals & Orders Orders Placed This Encounter  Procedures   DEXAScan    Follow-up Plan Follow-up with Agapito Games, MD as planned Schedule shingrix and tetanus vaccine at the pharmacy.  Foot exam can be done at the next in office visit Bone density referral sent. Medicare wellness visit in one year. AVS printed and mailed to the patient.  I have personally reviewed and noted the following in the patient's chart:   Medical and social history Use of alcohol, tobacco or illicit drugs  Current medications and supplements Functional ability and status Nutritional status Physical activity Advanced directives List of other physicians Hospitalizations, surgeries, and ER visits in previous 12 months Vitals Screenings to include cognitive, depression, and falls Referrals and appointments  In addition, I have reviewed and discussed with [name] certain preventive protocols, quality metrics, and best practice recommendations. A written personalized care plan for preventive services as well as general preventive health recommendations is available and can be mailed to the patient at her request.       Modesto Charon, RN BSN  08/11/2022

## 2022-08-11 NOTE — Patient Instructions (Addendum)
MEDICARE ANNUAL WELLNESS VISIT Health Maintenance Summary and Written Plan of Care  [name] ,  Thank you for allowing me to perform your Medicare Annual Wellness Visit and for your ongoing commitment to your health.   Health Maintenance & Immunization History Health Maintenance  Topic Date Due   DTaP/Tdap/Td (2 - Td or Tdap) 10/28/2020   FOOT EXAM  08/14/2022 (Originally 06/20/2022)   Zoster Vaccines- Shingrix (1 of 2) 11/10/2022 (Originally 02/27/1997)   COVID-19 Vaccine (3 - 2023-24 season) 05/31/2023 (Originally 01/03/2022)   Diabetic kidney evaluation - Urine ACR  10/24/2022   Diabetic kidney evaluation - eGFR measurement  11/07/2022   HEMOGLOBIN A1C  11/12/2022   INFLUENZA VACCINE  12/04/2022   OPHTHALMOLOGY EXAM  02/25/2023   Medicare Annual Wellness (AWV)  08/11/2023   COLONOSCOPY (Pts 45-76yrs Insurance coverage will need to be confirmed)  02/17/2024   Pneumonia Vaccine 71+ Years old  Completed   DEXA SCAN  Completed   Hepatitis C Screening  Completed   HPV VACCINES  Aged Out   Immunization History  Administered Date(s) Administered   Fluad Quad(high Dose 65+) 12/27/2018, 01/11/2020, 06/20/2021, 02/11/2022   Influenza Whole 04/02/2006, 04/19/2007   Influenza, High Dose Seasonal PF 12/24/2016, 02/18/2018   Influenza, Seasonal, Injecte, Preservative Fre 03/31/2012   Influenza,inj,Quad PF,6+ Mos 02/28/2013, 03/02/2014, 03/20/2015, 12/24/2015   PFIZER(Purple Top)SARS-COV-2 Vaccination 07/06/2019, 07/27/2019   Pneumococcal Conjugate-13 11/17/2014   Pneumococcal Polysaccharide-23 04/02/2006, 07/01/2012   Tdap 10/29/2010   Zoster, Live 12/23/2010    These are the patient goals that we discussed:  Goals Addressed               This Visit's Progress     Patient Stated (pt-stated)        Patient stated that she would like to be able to walk better.         This is a list of Health Maintenance Items that are overdue or due now: Health Maintenance Due  Topic  Date Due   DTaP/Tdap/Td (2 - Td or Tdap) 10/28/2020   Td vaccine Bone densitometry screening Shingrix vaccine Foot exam  Orders/Referrals Placed Today: Orders Placed This Encounter  Procedures   DEXAScan    Standing Status:   Future    Standing Expiration Date:   08/11/2023    Scheduling Instructions:     Please call patient to schedule    Order Specific Question:   Reason for exam:    Answer:   post menopausal    Order Specific Question:   Preferred imaging location?    Answer:   [facility]    (Contact our referral department at [phone] if you have not spoken with someone about your referral appointment within the next 5 days)    Follow-up Plan Follow-up with Agapito Games, MD as planned Schedule shingrix and tetanus vaccine at the pharmacy.  Foot exam can be done at the next in office visit Bone density referral sent. Medicare wellness visit in one year. AVS printed and mailed to the patient.      Health Maintenance, Female Adopting a healthy lifestyle and getting preventive care are important in promoting health and wellness. Ask your health care provider about: The right schedule for you to have regular tests and exams. Things you can do on your own to prevent diseases and keep yourself healthy. What should I know about diet, weight, and exercise? Eat a healthy diet  Eat a diet that includes plenty of vegetables, fruits, low-fat dairy products, and  lean protein. Do not eat a lot of foods that are high in solid fats, added sugars, or sodium. Maintain a healthy weight Body mass index (BMI) is used to identify weight problems. It estimates body fat based on height and weight. Your health care provider can help determine your BMI and help you achieve or maintain a healthy weight. Get regular exercise Get regular exercise. This is one of the most important things you can do for your health. Most adults should: Exercise for at least 150 minutes  each week. The exercise should increase your heart rate and make you sweat (moderate-intensity exercise). Do strengthening exercises at least twice a week. This is in addition to the moderate-intensity exercise. Spend less time sitting. Even light physical activity can be beneficial. Watch cholesterol and blood lipids Have your blood tested for lipids and cholesterol at 76 years of age, then have this test every 5 years. Have your cholesterol levels checked more often if: Your lipid or cholesterol levels are high. You are older than 76 years of age. You are at high risk for heart disease. What should I know about cancer screening? Depending on your health history and family history, you may need to have cancer screening at various ages. This may include screening for: Breast cancer. Cervical cancer. Colorectal cancer. Skin cancer. Lung cancer. What should I know about heart disease, diabetes, and high blood pressure? Blood pressure and heart disease High blood pressure causes heart disease and increases the risk of stroke. This is more likely to develop in people who have high blood pressure readings or are overweight. Have your blood pressure checked: Every 3-5 years if you are 8018-76 years of age. Every year if you are 76 years old or older. Diabetes Have regular diabetes screenings. This checks your fasting blood sugar level. Have the screening done: Once every three years after age 76 if you are at a normal weight and have a low risk for diabetes. More often and at a younger age if you are overweight or have a high risk for diabetes. What should I know about preventing infection? Hepatitis B If you have a higher risk for hepatitis B, you should be screened for this virus. Talk with your health care provider to find out if you are at risk for hepatitis B infection. Hepatitis C Testing is recommended for: Everyone born from [id number] through 1964. Anyone with known risk factors for  hepatitis C. Sexually transmitted infections (STIs) Get screened for STIs, including gonorrhea and chlamydia, if: You are sexually active and are younger than 76 years of age. You are older than 76 years of age and your health care provider tells you that you are at risk for this type of infection. Your sexual activity has changed since you were last screened, and you are at increased risk for chlamydia or gonorrhea. Ask your health care provider if you are at risk. Ask your health care provider about whether you are at high risk for HIV. Your health care provider may recommend a prescription medicine to help prevent HIV infection. If you choose to take medicine to prevent HIV, you should first get tested for HIV. You should then be tested every 3 months for as long as you are taking the medicine. Pregnancy If you are about to stop having your period (premenopausal) and you may become pregnant, seek counseling before you get pregnant. Take 400 to 800 micrograms (mcg) of folic acid every day if you become pregnant. Ask for birth control (  contraception) if you want to prevent pregnancy. Osteoporosis and menopause Osteoporosis is a disease in which the bones lose minerals and strength with aging. This can result in bone fractures. If you are 40 years old or older, or if you are at risk for osteoporosis and fractures, ask your health care provider if you should: Be screened for bone loss. Take a calcium or vitamin D supplement to lower your risk of fractures. Be given hormone replacement therapy (HRT) to treat symptoms of menopause. Follow these instructions at home: Alcohol use Do not drink alcohol if: Your health care provider tells you not to drink. You are pregnant, may be pregnant, or are planning to become pregnant. If you drink alcohol: Limit how much you have to: 0-1 drink a day. Know how much alcohol is in your drink. In the [place] one drink equals one 12 oz bottle of beer (355 mL), one 5  oz glass of wine (148 mL), or one 1 oz glass of hard liquor (44 mL). Lifestyle Do not use any products that contain nicotine or tobacco. These products include cigarettes, chewing tobacco, and vaping devices, such as e-cigarettes. If you need help quitting, ask your health care provider. Do not use street drugs. Do not share needles. Ask your health care provider for help if you need support or information about quitting drugs. General instructions Schedule regular health, dental, and eye exams. Stay current with your vaccines. Tell your health care provider if: You often feel depressed. You have ever been abused or do not feel safe at home. Summary Adopting a healthy lifestyle and getting preventive care are important in promoting health and wellness. Follow your health care provider's instructions about healthy diet, exercising, and getting tested or screened for diseases. Follow your health care provider's instructions on monitoring your cholesterol and blood pressure. This information is not intended to replace advice given to you by your health care provider. Make sure you discuss any questions you have with your health care provider. Document Revised: 09/10/2020 Document Reviewed: 09/10/2020 Elsevier Patient Education  2023 [employer].

## 2022-08-12 ENCOUNTER — Ambulatory Visit (INDEPENDENT_AMBULATORY_CARE_PROVIDER_SITE_OTHER): Payer: Medicare PPO | Admitting: Family Medicine

## 2022-08-12 ENCOUNTER — Encounter: Payer: Self-pay | Admitting: Family Medicine

## 2022-08-12 VITALS — BP 134/68 | HR 75 | Ht 64.0 in | Wt 198.0 lb

## 2022-08-12 DIAGNOSIS — M160 Bilateral primary osteoarthritis of hip: Secondary | ICD-10-CM | POA: Diagnosis not present

## 2022-08-12 DIAGNOSIS — K2101 Gastro-esophageal reflux disease with esophagitis, with bleeding: Secondary | ICD-10-CM

## 2022-08-12 DIAGNOSIS — E1169 Type 2 diabetes mellitus with other specified complication: Secondary | ICD-10-CM

## 2022-08-12 DIAGNOSIS — R630 Anorexia: Secondary | ICD-10-CM | POA: Diagnosis not present

## 2022-08-12 DIAGNOSIS — E669 Obesity, unspecified: Secondary | ICD-10-CM

## 2022-08-12 DIAGNOSIS — E7800 Pure hypercholesterolemia, unspecified: Secondary | ICD-10-CM | POA: Diagnosis not present

## 2022-08-12 LAB — POCT GLYCOSYLATED HEMOGLOBIN (HGB A1C): Hemoglobin A1C: 6.3 % — AB (ref 4.0–5.6)

## 2022-08-12 MED ORDER — DICLOFENAC SODIUM 1 % EX GEL: 2.0000 g | Freq: Four times a day (QID) | CUTANEOUS | 1 refills | Status: DC

## 2022-08-12 MED ORDER — METFORMIN HCL ER 500 MG PO TB24
1000.0000 mg | ORAL_TABLET | Freq: Two times a day (BID) | ORAL | 3 refills | Status: DC
Start: 2022-08-12 — End: 2022-09-04

## 2022-08-12 MED ORDER — SIMVASTATIN 40 MG PO TABS: 40.0000 mg | ORAL_TABLET | Freq: Every day | ORAL | 3 refills | Status: DC

## 2022-08-12 MED ORDER — PANTOPRAZOLE SODIUM 40 MG PO TBEC: 40.0000 mg | DELAYED_RELEASE_TABLET | Freq: Every day | ORAL | 3 refills | Status: DC

## 2022-08-12 NOTE — Progress Notes (Signed)
Established Patient Office Visit  Subjective   Patient ID: Shelby Thomas, female    DOB: June 29, 1945  Age: 77 y.o. MRN: [id number]  Chief Complaint  Patient presents with   Diabetes    HPI  Diabetes - no hypoglycemic events. No wounds or sores that are not healing well. No increased thirst or urination. Checking glucose at home. Taking medications as prescribed without any side effects.Taking metformin once a day most days and Occ twice a day.  Has had a dec appetite.  No abdominal pain or no change in bowels or constipation.  Down 4 lbs since last here.   She also says she feels like she has not been hearing well.  She bought some over-the-counter hearing aids and still feels like she is not quite hearing well and would like a referral.    ROS    Objective:     BP 134/68   Pulse 75   Ht 5\' 4"  (1.626 m)   Wt 198 lb (89.8 kg)   SpO2 95%   BMI 33.99 kg/m    Physical Exam Constitutional:      Appearance: Normal appearance.  HENT:     Head: Normocephalic and atraumatic.  Cardiovascular:     Rate and Rhythm: Normal rate and regular rhythm.  Pulmonary:     Effort: Pulmonary effort is normal.     Breath sounds: Normal breath sounds.  Neurological:     Mental Status: She is alert.      Results for orders placed or performed in visit on 08/12/22  Lipid Panel w/reflex Direct LDL  Result Value Ref Range   Cholesterol 173 <200 mg/dL   HDL 63 > OR = 50 mg/dL   Triglycerides 86 <168 mg/dL   LDL Cholesterol (Calc) 92 mg/dL (calc)   Total CHOL/HDL Ratio 2.7 <5.0 (calc)   Non-HDL Cholesterol (Calc) 110 <130 mg/dL (calc)  COMPLETE METABOLIC PANEL WITH GFR  Result Value Ref Range   Glucose, Bld 103 (H) 65 - 99 mg/dL   BUN 8 7 - 25 mg/dL   Creat 3.72 9.02 - 1.11 mg/dL   eGFR 91 > OR = 60 BZ/MCE/0.22V3   BUN/Creatinine Ratio SEE NOTE: 6 - 22 (calc)   Sodium 147 (H) 135 - 146 mmol/L   Potassium 3.6 3.5 - 5.3 mmol/L   Chloride 104 98 - 110 mmol/L   CO2 35 (H) 20 - 32  mmol/L   Calcium 9.4 8.6 - 10.4 mg/dL   Total Protein 6.9 6.1 - 8.1 g/dL   Albumin 4.1 3.6 - 5.1 g/dL   Globulin 2.8 1.9 - 3.7 g/dL (calc)   AG Ratio 1.5 1.0 - 2.5 (calc)   Total Bilirubin 0.5 0.2 - 1.2 mg/dL   Alkaline phosphatase (APISO) 52 37 - 153 U/L   AST 15 10 - 35 U/L   ALT 9 6 - 29 U/L  CBC with Differential  Result Value Ref Range   WBC 5.1 3.8 - 10.8 Thousand/uL   RBC 3.94 3.80 - 5.10 Million/uL   Hemoglobin 12.1 11.7 - 15.5 g/dL   HCT 61.2 24.4 - 97.5 %   MCV 97.7 80.0 - 100.0 fL   MCH 30.7 27.0 - 33.0 pg   MCHC 31.4 (L) 32.0 - 36.0 g/dL   RDW 30.0 51.1 - 02.1 %   Platelets 243 140 - 400 Thousand/uL   MPV 10.1 7.5 - 12.5 fL   Neutro Abs 3,106 1,500 - 7,800 cells/uL   Lymphs Abs 1,545 850 -  3,900 cells/uL   Absolute Monocytes 347 200 - 950 cells/uL   Eosinophils Absolute 71 15 - 500 cells/uL   Basophils Absolute 31 0 - 200 cells/uL   Neutrophils Relative % 60.9 %   Total Lymphocyte 30.3 %   Monocytes Relative 6.8 %   Eosinophils Relative 1.4 %   Basophils Relative 0.6 %  TSH  Result Value Ref Range   TSH 0.77 0.40 - 4.50 mIU/L  POCT glycosylated hemoglobin (Hb A1C)  Result Value Ref Range   Hemoglobin A1C 6.3 (A) 4.0 - 5.6 %   HbA1c POC (<> result, manual entry)     HbA1c, POC (prediabetic range)     HbA1c, POC (controlled diabetic range)        The 10-year ASCVD risk score (Arnett DK, et al., 2019) is: 35%    Assessment & Plan:   Problem List Items Addressed This Visit       Digestive   GERD    Controlled on PPI. Hx of esoph stricture      Relevant Medications   pantoprazole (PROTONIX) 40 MG tablet   Other Relevant Orders   Lipid Panel w/reflex Direct LDL (Completed)   COMPLETE METABOLIC PANEL WITH GFR (Completed)   CBC with Differential (Completed)   TSH (Completed)     Endocrine   Type 2 diabetes mellitus with obesity - Primary    A1C of 6.3 today is fantastic and down from 8.2. she is doing great!!!! Continue to work on [employer].        Relevant Medications   metFORMIN (GLUCOPHAGE-XR) 500 MG 24 hr tablet   simvastatin (ZOCOR) 40 MG tablet   Other Relevant Orders   POCT glycosylated hemoglobin (Hb A1C) (Completed)   Lipid Panel w/reflex Direct LDL (Completed)   COMPLETE METABOLIC PANEL WITH GFR (Completed)   CBC with Differential (Completed)   TSH (Completed)     Other   Hyperlipidemia    Due to recheck lipid to make sure LDL is at goal. Continue simvastatin 40 mg.      Relevant Medications   simvastatin (ZOCOR) 40 MG tablet   Other Relevant Orders   Lipid Panel w/reflex Direct LDL (Completed)   COMPLETE METABOLIC PANEL WITH GFR (Completed)   CBC with Differential (Completed)   TSH (Completed)   Other Visit Diagnoses     Primary osteoarthritis of both hips       Relevant Medications   diclofenac Sodium (VOLTAREN) 1 % GEL   Other Relevant Orders   Lipid Panel w/reflex Direct LDL (Completed)   COMPLETE METABOLIC PANEL WITH GFR (Completed)   CBC with Differential (Completed)   TSH (Completed)   Decreased appetite       Relevant Orders   Lipid Panel w/reflex Direct LDL (Completed)   COMPLETE METABOLIC PANEL WITH GFR (Completed)   CBC with Differential (Completed)   TSH (Completed)      Dec appetite - will check labs and rule out thyroid disorder or change in liver and kidney function.    Return in about 15 weeks (around 11/25/2022) for Diabetes follow-up.    Nani Gasser, MD

## 2022-08-13 ENCOUNTER — Encounter: Payer: Self-pay | Admitting: Family Medicine

## 2022-08-13 LAB — CBC WITH DIFFERENTIAL/PLATELET
Absolute Monocytes: 347 cells/uL (ref 200–950)
Basophils Absolute: 31 cells/uL (ref 0–200)
Basophils Relative: 0.6 %
Eosinophils Absolute: 71 cells/uL (ref 15–500)
Eosinophils Relative: 1.4 %
HCT: 38.5 % (ref 35.0–45.0)
Hemoglobin: 12.1 g/dL (ref 11.7–15.5)
Lymphs Abs: 1545 cells/uL (ref 850–3900)
MCH: 30.7 pg (ref 27.0–33.0)
MCHC: 31.4 g/dL — ABNORMAL LOW (ref 32.0–36.0)
MCV: 97.7 fL (ref 80.0–100.0)
MPV: 10.1 fL (ref 7.5–12.5)
Monocytes Relative: 6.8 %
Neutro Abs: 3106 cells/uL (ref 1500–7800)
Neutrophils Relative %: 60.9 %
Platelets: 243 10*3/uL (ref 140–400)
RBC: 3.94 10*6/uL (ref 3.80–5.10)
RDW: 12.5 % (ref 11.0–15.0)
Total Lymphocyte: 30.3 %
WBC: 5.1 10*3/uL (ref 3.8–10.8)

## 2022-08-13 LAB — COMPLETE METABOLIC PANEL WITH GFR
AG Ratio: 1.5 (calc) (ref 1.0–2.5)
ALT: 9 U/L (ref 6–29)
AST: 15 U/L (ref 10–35)
Albumin: 4.1 g/dL (ref 3.6–5.1)
Alkaline phosphatase (APISO): 52 U/L (ref 37–153)
BUN: 8 mg/dL (ref 7–25)
CO2: 35 mmol/L — ABNORMAL HIGH (ref 20–32)
Calcium: 9.4 mg/dL (ref 8.6–10.4)
Chloride: 104 mmol/L (ref 98–110)
Creat: 0.67 mg/dL (ref 0.60–1.00)
Globulin: 2.8 g/dL (calc) (ref 1.9–3.7)
Glucose, Bld: 103 mg/dL — ABNORMAL HIGH (ref 65–99)
Potassium: 3.6 mmol/L (ref 3.5–5.3)
Sodium: 147 mmol/L — ABNORMAL HIGH (ref 135–146)
Total Bilirubin: 0.5 mg/dL (ref 0.2–1.2)
Total Protein: 6.9 g/dL (ref 6.1–8.1)
eGFR: 91 mL/min/{1.73_m2} (ref >=60)

## 2022-08-13 LAB — LIPID PANEL W/REFLEX DIRECT LDL
Cholesterol: 173 mg/dL (ref <200)
HDL: 63 mg/dL (ref >=50)
LDL Cholesterol (Calc): 92 mg/dL (calc)
Non-HDL Cholesterol (Calc): 110 mg/dL (calc) (ref <130)
Total CHOL/HDL Ratio: 2.7 (calc) (ref <5.0)
Triglycerides: 86 mg/dL (ref <150)

## 2022-08-13 LAB — TSH: TSH: 0.77 mIU/L (ref 0.40–4.50)

## 2022-08-13 NOTE — Assessment & Plan Note (Signed)
Due to recheck lipid to make sure LDL is at goal. Continue simvastatin 40 mg.

## 2022-08-13 NOTE — Assessment & Plan Note (Signed)
A1C of 6.3 today is fantastic and down from 8.2. she is doing great!!!! Continue to work on [employer].

## 2022-08-13 NOTE — Assessment & Plan Note (Signed)
Controlled on PPI. Hx of esoph stricture

## 2022-08-13 NOTE — Progress Notes (Signed)
Hi Shelby Thomas, sodium level was just a little borderline elevated I am not sure why.  It is not in a dangerous or concerning range but we will keep an eye on that.  Hemoglobin looks good.  No anemia.  LDL cholesterol is under 100 which is fantastic!  Great work!  Thyroid looks great.

## 2022-09-04 ENCOUNTER — Telehealth: Payer: Self-pay | Admitting: Family Medicine

## 2022-09-04 ENCOUNTER — Other Ambulatory Visit: Payer: Self-pay | Admitting: *Deleted

## 2022-09-04 DIAGNOSIS — E669 Obesity, unspecified: Secondary | ICD-10-CM

## 2022-09-04 DIAGNOSIS — K2101 Gastro-esophageal reflux disease with esophagitis, with bleeding: Secondary | ICD-10-CM

## 2022-09-04 DIAGNOSIS — E7800 Pure hypercholesterolemia, unspecified: Secondary | ICD-10-CM

## 2022-09-04 MED ORDER — SIMVASTATIN 40 MG PO TABS
40.0000 mg | ORAL_TABLET | Freq: Every day | ORAL | 3 refills | Status: DC
Start: 2022-09-04 — End: 2022-10-17

## 2022-09-04 MED ORDER — PANTOPRAZOLE SODIUM 40 MG PO TBEC
40.0000 mg | DELAYED_RELEASE_TABLET | Freq: Every day | ORAL | 3 refills | Status: DC
Start: 2022-09-04 — End: 2022-09-04

## 2022-09-04 MED ORDER — SIMVASTATIN 40 MG PO TABS
40.0000 mg | ORAL_TABLET | Freq: Every day | ORAL | 3 refills | Status: DC
Start: 2022-09-04 — End: 2022-09-04

## 2022-09-04 MED ORDER — PANTOPRAZOLE SODIUM 40 MG PO TBEC
40.0000 mg | DELAYED_RELEASE_TABLET | Freq: Every day | ORAL | 3 refills | Status: DC
Start: 2022-09-04 — End: 2022-10-15

## 2022-09-04 MED ORDER — METFORMIN HCL ER 500 MG PO TB24
1000.0000 mg | ORAL_TABLET | Freq: Two times a day (BID) | ORAL | 3 refills | Status: DC
Start: 2022-09-04 — End: 2022-10-17

## 2022-09-04 MED ORDER — METFORMIN HCL ER 500 MG PO TB24
1000.0000 mg | ORAL_TABLET | Freq: Two times a day (BID) | ORAL | 3 refills | Status: DC
Start: 2022-09-04 — End: 2022-09-04

## 2022-09-04 NOTE — Telephone Encounter (Signed)
Patient called requesting a refill of metFORMIN (GLUCOPHAGE-XR) 500 MG 24 hr tablet [[id number]] and pantoprazole (PROTONIX) 40 MG tablet [[id number]] and simvastatin (ZOCOR) 40 MG tablet [[id number]]   Pharmacy -  [id number], [place] - [address] AT [facility] OF [address]

## 2022-09-04 NOTE — Telephone Encounter (Signed)
Pt called and advised that medications have been sent.

## 2022-09-15 NOTE — Telephone Encounter (Signed)
Made in error

## 2022-10-15 ENCOUNTER — Other Ambulatory Visit: Payer: Self-pay

## 2022-10-15 DIAGNOSIS — K2101 Gastro-esophageal reflux disease with esophagitis, with bleeding: Secondary | ICD-10-CM

## 2022-10-15 MED ORDER — PANTOPRAZOLE SODIUM 40 MG PO TBEC
40.0000 mg | DELAYED_RELEASE_TABLET | Freq: Every day | ORAL | 1 refills | Status: DC
Start: 2022-10-15 — End: 2022-10-17

## 2022-10-17 ENCOUNTER — Other Ambulatory Visit: Payer: Self-pay

## 2022-10-17 DIAGNOSIS — E7800 Pure hypercholesterolemia, unspecified: Secondary | ICD-10-CM

## 2022-10-17 DIAGNOSIS — K2101 Gastro-esophageal reflux disease with esophagitis, with bleeding: Secondary | ICD-10-CM

## 2022-10-17 DIAGNOSIS — E1169 Type 2 diabetes mellitus with other specified complication: Secondary | ICD-10-CM

## 2022-10-17 MED ORDER — SIMVASTATIN 40 MG PO TABS
40.0000 mg | ORAL_TABLET | Freq: Every day | ORAL | 3 refills | Status: DC
Start: 2022-10-17 — End: 2023-08-31

## 2022-10-17 MED ORDER — TRUE METRIX BLOOD GLUCOSE TEST VI STRP
ORAL_STRIP | 99 refills | Status: AC
Start: 2022-10-17 — End: ?

## 2022-10-17 MED ORDER — METFORMIN HCL ER 500 MG PO TB24
1000.0000 mg | ORAL_TABLET | Freq: Two times a day (BID) | ORAL | 3 refills | Status: DC
Start: 2022-10-17 — End: 2023-03-16

## 2022-10-17 MED ORDER — PANTOPRAZOLE SODIUM 40 MG PO TBEC
40.0000 mg | DELAYED_RELEASE_TABLET | Freq: Every day | ORAL | 3 refills | Status: DC
Start: 2022-10-17 — End: 2023-08-31

## 2022-10-17 MED ORDER — TRUE METRIX AIR GLUCOSE METER DEVI
99 refills | Status: AC
Start: 2022-10-17 — End: ?

## 2022-10-17 MED ORDER — BD SWAB SINGLE USE REGULAR PADS
MEDICATED_PAD | 99 refills | Status: AC
Start: 2022-10-17 — End: ?

## 2022-10-17 MED ORDER — TRUEPLUS LANCETS 30G MISC
99 refills | Status: AC
Start: 2022-10-17 — End: ?

## 2022-11-03 ENCOUNTER — Telehealth: Payer: Self-pay | Admitting: Family Medicine

## 2022-11-03 NOTE — Telephone Encounter (Signed)
Son Ivin Booty called he is asking for a referral for home health services He can be reached at [phone]

## 2022-11-03 NOTE — Telephone Encounter (Signed)
Please call Shelby Thomas and find out what her needs are specifically to she does need a nurse or formal physical therapy etc.??

## 2022-11-05 NOTE — Telephone Encounter (Signed)
Called the number for pt's son this was actually the number for the [facility] services.   So I called the patient and spoke with her directly and she stated that she doesn't need or want these services her son feels that she needs the services. I did explain to her that they can come out and do an assessment and if she did qualify for any services she could go from there or she could just decline she stated that at this time she doesn't feel that she needed this. However she does need an updated handicap placard. I told her that I would complete this and mail it out to her.

## 2022-12-04 ENCOUNTER — Ambulatory Visit: Payer: Medicare PPO | Admitting: Family Medicine

## 2022-12-04 ENCOUNTER — Encounter: Payer: Self-pay | Admitting: Family Medicine

## 2022-12-04 VITALS — BP 136/61 | HR 79 | Ht 64.0 in | Wt 194.0 lb

## 2022-12-04 DIAGNOSIS — E1169 Type 2 diabetes mellitus with other specified complication: Secondary | ICD-10-CM

## 2022-12-04 DIAGNOSIS — E669 Obesity, unspecified: Secondary | ICD-10-CM

## 2022-12-04 DIAGNOSIS — Z7984 Long term (current) use of oral hypoglycemic drugs: Secondary | ICD-10-CM

## 2022-12-04 DIAGNOSIS — R29898 Other symptoms and signs involving the musculoskeletal system: Secondary | ICD-10-CM

## 2022-12-04 DIAGNOSIS — I700 Atherosclerosis of aorta: Secondary | ICD-10-CM

## 2022-12-04 DIAGNOSIS — E870 Hyperosmolality and hypernatremia: Secondary | ICD-10-CM | POA: Diagnosis not present

## 2022-12-04 LAB — POCT GLYCOSYLATED HEMOGLOBIN (HGB A1C): Hemoglobin A1C: 6 % — AB (ref 4.0–5.6)

## 2022-12-04 NOTE — Assessment & Plan Note (Signed)
1C looks great today at 6.0 we discussed that if it goes lower than 6 next time we will probably decrease her metformin but for now we will just continue with current regimen since she is doing great.  She has lost another 4 pounds purposely by altering her diet.  Keep up the great work with dietary habits that are good and trying to stay as active as she can.

## 2022-12-04 NOTE — Progress Notes (Signed)
Established Patient Office Visit  Subjective   Patient ID: Shelby Thomas, female    DOB: 1946/03/24  Age: 77 y.o. MRN: [id number]  Chief Complaint  Patient presents with   Diabetes    HPI  Diabetes - no hypoglycemic events. No wounds or sores that are not healing well. No increased thirst or urination. Checking glucose at home. Taking medications as prescribed without any side effects.  She is that she is actually worked really hard to cut back on sugar and carbs.  She is actually lost about another 4 pounds since I last saw her.  She does need an updated handicap placard and would like to get a permanent one if possible.  She is no longer drives and has not really for years.  Usually her son or her grandchildren bring her in.  Hypertension- Pt denies chest pain, SOB, dizziness, or heart palpitations.  Taking meds as directed w/o problems.  Denies medication side effects.    She did want me to listen to her heart carefully today as once before we had noticed a very mild heart murmur.  Did get an alert necklace since I last saw her she says now that her husband passed away and she is by herself quite a bit she just felt like it would be more safe.     ROS    Objective:     BP 136/61   Pulse 79   Ht 5\' 4"  (1.626 m)   Wt 194 lb (88 kg)   SpO2 98%   BMI 33.30 kg/m    Physical Exam Vitals and nursing note reviewed.  Constitutional:      Appearance: She is well-developed.  HENT:     Head: Normocephalic and atraumatic.  Cardiovascular:     Rate and Rhythm: Normal rate and regular rhythm.     Heart sounds: Murmur heard.     Comments: Mild 2 out of 6 systolic ejection murmur. Pulmonary:     Effort: Pulmonary effort is normal.     Breath sounds: Normal breath sounds.  Skin:    General: Skin is warm and dry.  Neurological:     Mental Status: She is alert and oriented to person, place, and time.  Psychiatric:        Behavior: Behavior normal.      Results for  orders placed or performed in visit on 12/04/22  POCT glycosylated hemoglobin (Hb A1C)  Result Value Ref Range   Hemoglobin A1C 6.0 (A) 4.0 - 5.6 %   HbA1c POC (<> result, manual entry)     HbA1c, POC (prediabetic range)     HbA1c, POC (controlled diabetic range)        The 10-year ASCVD risk score (Arnett DK, et al., 2019) is: 35.6%    Assessment & Plan:   Problem List Items Addressed This Visit       Cardiovascular and Mediastinum   Aortic atherosclerosis (HCC)    Continue simvastatin daily.        Endocrine   Type 2 diabetes mellitus with obesity (HCC) - Primary    1C looks great today at 6.0 we discussed that if it goes lower than 6 next time we will probably decrease her metformin but for now we will just continue with current regimen since she is doing great.  She has lost another 4 pounds purposely by altering her diet.  Keep up the great work with dietary habits that are good and trying to stay as active  as she can.      Relevant Orders   POCT glycosylated hemoglobin (Hb A1C) (Completed)     Other   Weakness of both lower extremities   Relevant Orders   Ambulatory referral to Home Health   Other Visit Diagnoses     Hypernatremia       Relevant Orders   CMP14+EGFR       Also encouraged her to get her tetanus and shingles vaccines updated at the pharmacy where with her Medicare part D they should be free.  Also completed permanent handicap placard today.  Recent labs showed elevated sodium levels so I do want a recheck that today other labs are up-to-date and current.  Her son Shelby Thomas would also like [place] to look into see if she would be able to qualify for some additional help at home.  Return in about 3 months (around 03/06/2023) for Diabetes follow-up.   I spent 30 minutes on the day of the encounter to include pre-visit record review, face-to-face time with the patient and post visit ordering of test.   Nani Gasser, MD

## 2022-12-04 NOTE — Assessment & Plan Note (Signed)
Continue simvastatin daily

## 2022-12-05 NOTE — Progress Notes (Signed)
Sodium level is borderline but better. I'll recheck in 6 months.

## 2022-12-17 ENCOUNTER — Telehealth: Payer: Self-pay | Admitting: Family Medicine

## 2022-12-17 NOTE — Telephone Encounter (Signed)
[facility] called in to let us know the patient will start PT and Nursing tomorrow.

## 2022-12-18 DIAGNOSIS — M419 Scoliosis, unspecified: Secondary | ICD-10-CM | POA: Diagnosis not present

## 2022-12-18 DIAGNOSIS — K219 Gastro-esophageal reflux disease without esophagitis: Secondary | ICD-10-CM | POA: Diagnosis not present

## 2022-12-18 DIAGNOSIS — M5416 Radiculopathy, lumbar region: Secondary | ICD-10-CM | POA: Diagnosis not present

## 2022-12-18 DIAGNOSIS — E870 Hyperosmolality and hypernatremia: Secondary | ICD-10-CM | POA: Diagnosis not present

## 2022-12-18 DIAGNOSIS — E669 Obesity, unspecified: Secondary | ICD-10-CM | POA: Diagnosis not present

## 2022-12-18 DIAGNOSIS — E1151 Type 2 diabetes mellitus with diabetic peripheral angiopathy without gangrene: Secondary | ICD-10-CM | POA: Diagnosis not present

## 2022-12-18 DIAGNOSIS — I700 Atherosclerosis of aorta: Secondary | ICD-10-CM | POA: Diagnosis not present

## 2022-12-18 DIAGNOSIS — E1169 Type 2 diabetes mellitus with other specified complication: Secondary | ICD-10-CM | POA: Diagnosis not present

## 2022-12-18 DIAGNOSIS — I10 Essential (primary) hypertension: Secondary | ICD-10-CM | POA: Diagnosis not present

## 2022-12-22 DIAGNOSIS — E870 Hyperosmolality and hypernatremia: Secondary | ICD-10-CM | POA: Diagnosis not present

## 2022-12-22 DIAGNOSIS — M5416 Radiculopathy, lumbar region: Secondary | ICD-10-CM | POA: Diagnosis not present

## 2022-12-22 DIAGNOSIS — E1151 Type 2 diabetes mellitus with diabetic peripheral angiopathy without gangrene: Secondary | ICD-10-CM | POA: Diagnosis not present

## 2022-12-22 DIAGNOSIS — E1169 Type 2 diabetes mellitus with other specified complication: Secondary | ICD-10-CM | POA: Diagnosis not present

## 2022-12-22 DIAGNOSIS — E669 Obesity, unspecified: Secondary | ICD-10-CM | POA: Diagnosis not present

## 2022-12-22 DIAGNOSIS — M419 Scoliosis, unspecified: Secondary | ICD-10-CM | POA: Diagnosis not present

## 2022-12-22 DIAGNOSIS — K219 Gastro-esophageal reflux disease without esophagitis: Secondary | ICD-10-CM | POA: Diagnosis not present

## 2022-12-22 DIAGNOSIS — I700 Atherosclerosis of aorta: Secondary | ICD-10-CM | POA: Diagnosis not present

## 2022-12-22 DIAGNOSIS — I10 Essential (primary) hypertension: Secondary | ICD-10-CM | POA: Diagnosis not present

## 2022-12-23 ENCOUNTER — Telehealth: Payer: Self-pay | Admitting: Family Medicine

## 2022-12-23 NOTE — Telephone Encounter (Signed)
Shelby Thomas from [facility] called in to inform that he would like to see this patient beginning next week for the following frequency:  Twice a week for 2 weeks, then Once a week

## 2022-12-24 NOTE — Telephone Encounter (Signed)
Unable to give VO due to not having a phone number to contact.

## 2022-12-25 ENCOUNTER — Telehealth: Payer: Self-pay | Admitting: Family Medicine

## 2022-12-25 NOTE — Telephone Encounter (Signed)
Vincenza Hews called from [facility]  Please call back at [phone] Lennox Solders from [facility] called in to inform that he would like to see this patient beginning next week for the following frequency:   Twice a week for 2 weeks, then Once a week

## 2022-12-25 NOTE — Telephone Encounter (Signed)
Cindy with [facility] called.   In addition, requesting VO for nursing once a week for 5 weeks for diabetic education. Office [phone]

## 2022-12-30 ENCOUNTER — Telehealth: Payer: Self-pay | Admitting: Family Medicine

## 2022-12-30 NOTE — Telephone Encounter (Signed)
Cindy from [facility] called in again today requesting a verbal order for nursing once a week for 5 weeks for diabetic education.  Arline Asp [phone]

## 2022-12-31 NOTE — Telephone Encounter (Signed)
Called and spoke w/Denise gave VO  skilled nursing 2x week x1 week and 1x week for 2 weeks.

## 2022-12-31 NOTE — Telephone Encounter (Signed)
Spoke w/Denise at [facility] and gave VO

## 2023-01-02 ENCOUNTER — Telehealth: Payer: Self-pay | Admitting: Family Medicine

## 2023-01-02 DIAGNOSIS — K219 Gastro-esophageal reflux disease without esophagitis: Secondary | ICD-10-CM | POA: Diagnosis not present

## 2023-01-02 DIAGNOSIS — I700 Atherosclerosis of aorta: Secondary | ICD-10-CM | POA: Diagnosis not present

## 2023-01-02 DIAGNOSIS — E1169 Type 2 diabetes mellitus with other specified complication: Secondary | ICD-10-CM | POA: Diagnosis not present

## 2023-01-02 DIAGNOSIS — I10 Essential (primary) hypertension: Secondary | ICD-10-CM | POA: Diagnosis not present

## 2023-01-02 DIAGNOSIS — E870 Hyperosmolality and hypernatremia: Secondary | ICD-10-CM | POA: Diagnosis not present

## 2023-01-02 DIAGNOSIS — M419 Scoliosis, unspecified: Secondary | ICD-10-CM | POA: Diagnosis not present

## 2023-01-02 DIAGNOSIS — E1151 Type 2 diabetes mellitus with diabetic peripheral angiopathy without gangrene: Secondary | ICD-10-CM | POA: Diagnosis not present

## 2023-01-02 DIAGNOSIS — E669 Obesity, unspecified: Secondary | ICD-10-CM | POA: Diagnosis not present

## 2023-01-02 DIAGNOSIS — M5416 Radiculopathy, lumbar region: Secondary | ICD-10-CM | POA: Diagnosis not present

## 2023-01-02 NOTE — Telephone Encounter (Signed)
Sam, Physical Therapist with [facility] called Re: missed report.  Patient was not seen  because of a cold and not feeling well.

## 2023-01-07 DIAGNOSIS — E1151 Type 2 diabetes mellitus with diabetic peripheral angiopathy without gangrene: Secondary | ICD-10-CM | POA: Diagnosis not present

## 2023-01-07 DIAGNOSIS — K219 Gastro-esophageal reflux disease without esophagitis: Secondary | ICD-10-CM | POA: Diagnosis not present

## 2023-01-07 DIAGNOSIS — E1169 Type 2 diabetes mellitus with other specified complication: Secondary | ICD-10-CM | POA: Diagnosis not present

## 2023-01-07 DIAGNOSIS — I700 Atherosclerosis of aorta: Secondary | ICD-10-CM | POA: Diagnosis not present

## 2023-01-07 DIAGNOSIS — I10 Essential (primary) hypertension: Secondary | ICD-10-CM | POA: Diagnosis not present

## 2023-01-07 DIAGNOSIS — E669 Obesity, unspecified: Secondary | ICD-10-CM | POA: Diagnosis not present

## 2023-01-07 DIAGNOSIS — M419 Scoliosis, unspecified: Secondary | ICD-10-CM | POA: Diagnosis not present

## 2023-01-07 DIAGNOSIS — E870 Hyperosmolality and hypernatremia: Secondary | ICD-10-CM | POA: Diagnosis not present

## 2023-01-07 DIAGNOSIS — M5416 Radiculopathy, lumbar region: Secondary | ICD-10-CM | POA: Diagnosis not present

## 2023-01-09 DIAGNOSIS — E1151 Type 2 diabetes mellitus with diabetic peripheral angiopathy without gangrene: Secondary | ICD-10-CM | POA: Diagnosis not present

## 2023-01-09 DIAGNOSIS — E870 Hyperosmolality and hypernatremia: Secondary | ICD-10-CM | POA: Diagnosis not present

## 2023-01-09 DIAGNOSIS — I10 Essential (primary) hypertension: Secondary | ICD-10-CM | POA: Diagnosis not present

## 2023-01-09 DIAGNOSIS — M419 Scoliosis, unspecified: Secondary | ICD-10-CM | POA: Diagnosis not present

## 2023-01-09 DIAGNOSIS — E669 Obesity, unspecified: Secondary | ICD-10-CM | POA: Diagnosis not present

## 2023-01-09 DIAGNOSIS — M5416 Radiculopathy, lumbar region: Secondary | ICD-10-CM | POA: Diagnosis not present

## 2023-01-09 DIAGNOSIS — E1169 Type 2 diabetes mellitus with other specified complication: Secondary | ICD-10-CM | POA: Diagnosis not present

## 2023-01-09 DIAGNOSIS — I700 Atherosclerosis of aorta: Secondary | ICD-10-CM | POA: Diagnosis not present

## 2023-01-09 DIAGNOSIS — K219 Gastro-esophageal reflux disease without esophagitis: Secondary | ICD-10-CM | POA: Diagnosis not present

## 2023-01-12 DIAGNOSIS — E1151 Type 2 diabetes mellitus with diabetic peripheral angiopathy without gangrene: Secondary | ICD-10-CM | POA: Diagnosis not present

## 2023-01-12 DIAGNOSIS — E870 Hyperosmolality and hypernatremia: Secondary | ICD-10-CM | POA: Diagnosis not present

## 2023-01-12 DIAGNOSIS — E669 Obesity, unspecified: Secondary | ICD-10-CM | POA: Diagnosis not present

## 2023-01-12 DIAGNOSIS — M419 Scoliosis, unspecified: Secondary | ICD-10-CM | POA: Diagnosis not present

## 2023-01-12 DIAGNOSIS — E1169 Type 2 diabetes mellitus with other specified complication: Secondary | ICD-10-CM | POA: Diagnosis not present

## 2023-01-12 DIAGNOSIS — K219 Gastro-esophageal reflux disease without esophagitis: Secondary | ICD-10-CM | POA: Diagnosis not present

## 2023-01-12 DIAGNOSIS — I700 Atherosclerosis of aorta: Secondary | ICD-10-CM | POA: Diagnosis not present

## 2023-01-12 DIAGNOSIS — I10 Essential (primary) hypertension: Secondary | ICD-10-CM | POA: Diagnosis not present

## 2023-01-12 DIAGNOSIS — M5416 Radiculopathy, lumbar region: Secondary | ICD-10-CM | POA: Diagnosis not present

## 2023-01-15 NOTE — Telephone Encounter (Signed)
This has been addressed.

## 2023-03-09 ENCOUNTER — Ambulatory Visit: Payer: Medicare PPO | Admitting: Family Medicine

## 2023-03-15 ENCOUNTER — Other Ambulatory Visit: Payer: Self-pay | Admitting: Family Medicine

## 2023-03-15 DIAGNOSIS — E1169 Type 2 diabetes mellitus with other specified complication: Secondary | ICD-10-CM

## 2023-03-15 DIAGNOSIS — E669 Obesity, unspecified: Secondary | ICD-10-CM

## 2023-06-08 ENCOUNTER — Ambulatory Visit (INDEPENDENT_AMBULATORY_CARE_PROVIDER_SITE_OTHER): Payer: Medicare PPO | Admitting: Family Medicine

## 2023-06-08 ENCOUNTER — Encounter: Payer: Self-pay | Admitting: Family Medicine

## 2023-06-08 VITALS — BP 150/65 | HR 91 | Ht 64.0 in | Wt 199.0 lb

## 2023-06-08 DIAGNOSIS — Z0000 Encounter for general adult medical examination without abnormal findings: Secondary | ICD-10-CM

## 2023-06-08 DIAGNOSIS — D500 Iron deficiency anemia secondary to blood loss (chronic): Secondary | ICD-10-CM

## 2023-06-08 DIAGNOSIS — I700 Atherosclerosis of aorta: Secondary | ICD-10-CM | POA: Diagnosis not present

## 2023-06-08 DIAGNOSIS — E669 Obesity, unspecified: Secondary | ICD-10-CM

## 2023-06-08 DIAGNOSIS — Z7984 Long term (current) use of oral hypoglycemic drugs: Secondary | ICD-10-CM

## 2023-06-08 DIAGNOSIS — E1169 Type 2 diabetes mellitus with other specified complication: Secondary | ICD-10-CM | POA: Diagnosis not present

## 2023-06-08 DIAGNOSIS — E7800 Pure hypercholesterolemia, unspecified: Secondary | ICD-10-CM | POA: Diagnosis not present

## 2023-06-08 NOTE — Progress Notes (Unsigned)
Complete physical exam  Patient: Shelby Thomas   DOB: 1945-09-03   78 y.o. Female  MRN: [id number]  Subjective:    Chief Complaint  Patient presents with   Annual Exam    MAYBREE RILING is a 78 y.o. female who presents today for a complete physical exam. She reports consuming a general diet. The patient does not participate in regular exercise at present. She generally feels well. She reports sleeping well. She does have additional problems to discuss today.    Most recent fall risk assessment:    08/12/2022   10:38 AM  Fall Risk   Falls in the past year? 0  Number falls in past yr: 0  Injury with Fall? 0  Risk for fall due to : No Fall Risks  Follow up Falls evaluation completed     Most recent depression screenings:    08/12/2022    4:38 PM 08/11/2022    3:44 PM  PHQ 2/9 Scores  PHQ - 2 Score 1 0  PHQ- 9 Score 2     {VISON DENTAL STD PSA (Optional):27386}  {History (Optional):23778}  Patient Care Team: Agapito Games, MD as PCP - General (Family Medicine) Early, Sung Amabile, NP as Nurse Practitioner (Nurse Practitioner) Gabriel Carina, [facility] (Inactive) as Pharmacist (Pharmacist)   Outpatient Medications Prior to Visit  Medication Sig   Alcohol Swabs (B-D SINGLE USE SWABS REGULAR) PADS Use to clean skin 3 times daily to check glucose.   AMBULATORY NON FORMULARY MEDICATION Medication Name: Oxygen Tubing for overnight O2. G47.34 I27.20 Fax:[phone]   ascorbic acid (VITAMIN C) 250 MG CHEW Chew 250 mg by mouth daily.   blood glucose meter kit and supplies Dispense based on patient and insurance preference. Use daily as directed. E11.69   Blood Glucose Monitoring Suppl (TRUE METRIX AIR GLUCOSE METER) DEVI Check glucose 3 times daily.   calcium citrate (CALCITRATE - DOSED IN MG ELEMENTAL CALCIUM) 950 (200 Ca) MG tablet Take 200 mg of elemental calcium by mouth daily.   diclofenac Sodium (VOLTAREN) 1 % GEL Apply 2 g topically 4 (four) times daily. Up to 4 joints    ferrous sulfate 324 MG TBEC Take 1 tablet by mouth daily with breakfast.   glucose blood (TRUE METRIX BLOOD GLUCOSE TEST) test strip Check glucose 3 times daily.   metFORMIN (GLUCOPHAGE-XR) 500 MG 24 hr tablet TAKE 2 TABLETS TWICE DAILY WITH MEALS   Multiple Vitamin (MULTIVITAMIN) capsule Take 1 capsule by mouth daily.   Omega-3 Fatty Acids (FISH OIL) 1000 MG CAPS Take 1,000 mg by mouth daily.   pantoprazole (PROTONIX) 40 MG tablet Take 1 tablet (40 mg total) by mouth daily.   simvastatin (ZOCOR) 40 MG tablet Take 1 tablet (40 mg total) by mouth daily.   TRUEplus Lancets 30G MISC Check glucose 3 times daily.   No facility-administered medications prior to visit.    ROS        Objective:     BP (!) 150/65   Pulse 91   Ht 5\' 4"  (1.626 m)   Wt 199 lb (90.3 kg)   SpO2 93%   BMI 34.16 kg/m  {Vitals History (Optional):23777}  Physical Exam Vitals and nursing note reviewed.  Constitutional:      Appearance: Normal appearance.  HENT:     Head: Normocephalic and atraumatic.  Eyes:     Conjunctiva/sclera: Conjunctivae normal.  Cardiovascular:     Rate and Rhythm: Normal rate and regular rhythm.  Pulmonary:  Effort: Pulmonary effort is normal.     Breath sounds: Normal breath sounds.  Skin:    General: Skin is warm and dry.  Neurological:     Mental Status: She is alert.  Psychiatric:        Mood and Affect: Mood normal.      No results found for any visits on 06/08/23. {Show previous labs (optional):23779}    Assessment & Plan:    Routine Health Maintenance and Physical Exam  Immunization History  Administered Date(s) Administered   Fluad Quad(high Dose 65+) 12/27/2018, 01/11/2020, 06/20/2021, 02/11/2022   Influenza Whole 04/02/2006, 04/19/2007   Influenza, High Dose Seasonal PF 12/24/2016, 02/18/2018   Influenza, Seasonal, Injecte, Preservative Fre 03/31/2012   Influenza,inj,Quad PF,6+ Mos 02/28/2013, 03/02/2014, 03/20/2015, 12/24/2015   PFIZER(Purple  Top)SARS-COV-2 Vaccination 07/06/2019, 07/27/2019   Pneumococcal Conjugate-13 11/17/2014   Pneumococcal Polysaccharide-23 04/02/2006, 07/01/2012   Tdap 10/29/2010   Zoster, Live 12/23/2010    Health Maintenance  Topic Date Due   Zoster Vaccines- Shingrix (1 of 2) 12/23/2010   DTaP/Tdap/Td (2 - Td or Tdap) 10/28/2020   Diabetic kidney evaluation - Urine ACR  10/24/2022   COVID-19 Vaccine (3 - 2024-25 season) 01/04/2023   OPHTHALMOLOGY EXAM  02/25/2023   HEMOGLOBIN A1C  06/06/2023   INFLUENZA VACCINE  08/03/2023 (Originally 12/04/2022)   Medicare Annual Wellness (AWV)  08/11/2023   Diabetic kidney evaluation - eGFR measurement  12/04/2023   FOOT EXAM  12/04/2023   Pneumonia Vaccine 28+ Years old  Completed   DEXA SCAN  Completed   Hepatitis C Screening  Completed   HPV VACCINES  Aged Out   Colonoscopy  Discontinued    Discussed health benefits of physical activity, and encouraged her to engage in regular exercise appropriate for her age and condition.  Problem List Items Addressed This Visit   None  No follow-ups on file.     Nani Gasser, MD

## 2023-06-10 LAB — CMP14+EGFR
ALT: 9 [IU]/L (ref 0–32)
AST: 15 [IU]/L (ref 0–40)
Albumin: 4 g/dL (ref 3.8–4.8)
Alkaline Phosphatase: 80 [IU]/L (ref 44–121)
BUN/Creatinine Ratio: 12 (ref 12–28)
BUN: 8 mg/dL (ref 8–27)
Bilirubin Total: 0.4 mg/dL (ref 0.0–1.2)
CO2: 28 mmol/L (ref 20–29)
Calcium: 9.8 mg/dL (ref 8.7–10.3)
Chloride: 102 mmol/L (ref 96–106)
Creatinine, Ser: 0.67 mg/dL (ref 0.57–1.00)
Globulin, Total: 3 g/dL (ref 1.5–4.5)
Glucose: 124 mg/dL — ABNORMAL HIGH (ref 70–99)
Potassium: 4.1 mmol/L (ref 3.5–5.2)
Sodium: 144 mmol/L (ref 134–144)
Total Protein: 7 g/dL (ref 6.0–8.5)
eGFR: 91 mL/min/{1.73_m2} (ref >59)

## 2023-06-10 LAB — LIPID PANEL
Chol/HDL Ratio: 3.4 {ratio} (ref 0.0–4.4)
Cholesterol, Total: 205 mg/dL — ABNORMAL HIGH (ref 100–199)
HDL: 61 mg/dL (ref >39)
LDL Chol Calc (NIH): 127 mg/dL — ABNORMAL HIGH (ref 0–99)
Triglycerides: 96 mg/dL (ref 0–149)
VLDL Cholesterol Cal: 17 mg/dL (ref 5–40)

## 2023-06-10 LAB — MICROALBUMIN / CREATININE URINE RATIO
Creatinine, Urine: 126.3 mg/dL
Microalb/Creat Ratio: 7 mg/g{creat} (ref 0–29)
Microalbumin, Urine: 8.3 ug/mL

## 2023-06-10 LAB — IRON,TIBC AND FERRITIN PANEL
Ferritin: 119 ng/mL (ref 15–150)
Iron Saturation: 24 % (ref 15–55)
Iron: 60 ug/dL (ref 27–139)
Total Iron Binding Capacity: 254 ug/dL (ref 250–450)
UIBC: 194 ug/dL (ref 118–369)

## 2023-06-10 LAB — SPECIMEN STATUS REPORT

## 2023-06-10 LAB — CBC
Hematocrit: 40.4 % (ref 34.0–46.6)
Hemoglobin: 12.6 g/dL (ref 11.1–15.9)
MCH: 31.7 pg (ref 26.6–33.0)
MCHC: 31.2 g/dL — ABNORMAL LOW (ref 31.5–35.7)
MCV: 102 fL — ABNORMAL HIGH (ref 79–97)
Platelets: 254 10*3/uL (ref 150–450)
RBC: 3.97 x10E6/uL (ref 3.77–5.28)
RDW: 12.5 % (ref 11.7–15.4)
WBC: 5.6 10*3/uL (ref 3.4–10.8)

## 2023-06-10 LAB — HEMOGLOBIN A1C
Est. average glucose Bld gHb Est-mCnc: 154 mg/dL
Hgb A1c MFr Bld: 7 % — ABNORMAL HIGH (ref 4.8–5.6)

## 2023-06-12 ENCOUNTER — Telehealth: Payer: Self-pay

## 2023-06-12 NOTE — Telephone Encounter (Signed)
 Source  Shelby Thomas A (Patient)   Subject  Shelby Thomas LABOR (Patient)   Topic  Clinical - Home Health Verbal Orders    Communication  Caller/Agency: Ada Clinical Manager [facility]    Callback Number: [phone]    Service Requested: Wanted to update provider - states have been unable to schedule nursing visit for patient. Have reached out multiple times but no response or call back. Will try back once more but there will be a delay in service since the first visit has not yet been schedule.    Frequency: N/A    Any new concerns about the patient? N/A

## 2023-06-19 ENCOUNTER — Telehealth: Payer: Self-pay | Admitting: Family Medicine

## 2023-06-19 NOTE — Telephone Encounter (Signed)
Copied from CRM [id number]. Topic: Referral - Status >> Jun 19, 2023  1:29 PM Nila Nephew wrote: Reason for CRM: Home Health - [facility] - Calling to state that orders have been received and they have attempted to contact patient at least three times. Patient has not answered. Calling to update that patient may be a non-admit.

## 2023-08-20 ENCOUNTER — Ambulatory Visit (INDEPENDENT_AMBULATORY_CARE_PROVIDER_SITE_OTHER)

## 2023-08-20 VITALS — Ht 63.5 in | Wt 198.0 lb

## 2023-08-20 DIAGNOSIS — Z0000 Encounter for general adult medical examination without abnormal findings: Secondary | ICD-10-CM

## 2023-08-20 DIAGNOSIS — Z5982 Transportation insecurity: Secondary | ICD-10-CM

## 2023-08-20 NOTE — Patient Instructions (Signed)
 [name] , Thank you for taking time to come for your Medicare Wellness Visit. I appreciate your ongoing commitment to your health goals. Please review the following plan we discussed and let me know if I can assist you in the future.   These are the goals we discussed:  Goals       Medication Management      Patient Goals/Self-Care Activities Over the next 90 days, patient will:  take medications as prescribed, opt for lean proteins, veggies, fruits, while doing carbs or sweets in moderation.  Follow Up Plan: Telephone follow up appointment with care management team member scheduled for:  3 months       Patient Stated (pt-stated)      07/23/2020 AWV Goal: Exercise for General Health  Patient will verbalize understanding of the benefits of increased physical activity: Exercising regularly is important. It will improve your overall fitness, flexibility, and endurance. Regular exercise also will improve your overall health. It can help you control your weight, reduce stress, and improve your bone density. Over the next year, patient will increase physical activity as tolerated with a goal of at least 150 minutes of moderate physical activity per week.  You can tell that you are exercising at a moderate intensity if your heart starts beating faster and you start breathing faster but can still hold a conversation. Moderate-intensity exercise ideas include: Walking 1 mile (1.6 km) in about 15 minutes Biking Hiking Golfing Dancing Water aerobics Patient will verbalize understanding of everyday activities that increase physical activity by providing examples like the following: Yard work, such as: Insurance underwriter Gardening Washing windows or floors Patient will be able to explain general safety guidelines for exercising:  Before you start a new exercise program, talk with your health care  provider. Do not exercise so much that you hurt yourself, feel dizzy, or get very short of breath. Wear comfortable clothes and wear shoes with good support. Drink plenty of water while you exercise to prevent dehydration or heat stroke. Work out until your breathing and your heartbeat get faster.       Patient Stated (pt-stated)      08/05/2021 AWV Goal: Exercise for General Health  Patient will verbalize understanding of the benefits of increased physical activity: Exercising regularly is important. It will improve your overall fitness, flexibility, and endurance. Regular exercise also will improve your overall health. It can help you control your weight, reduce stress, and improve your bone density. Over the next year, patient will increase physical activity as tolerated with a goal of at least 150 minutes of moderate physical activity per week.  You can tell that you are exercising at a moderate intensity if your heart starts beating faster and you start breathing faster but can still hold a conversation. Moderate-intensity exercise ideas include: Walking 1 mile (1.6 km) in about 15 minutes Biking Hiking Golfing Dancing Water aerobics Patient will verbalize understanding of everyday activities that increase physical activity by providing examples like the following: Yard work, such as: Insurance underwriter Gardening Washing windows or floors Patient will be able to explain general safety guidelines for exercising:  Before you start a new exercise program, talk with your health care provider. Do not exercise so much that you hurt yourself, feel dizzy, or get very short of breath. Wear comfortable clothes and wear  shoes with good support. Drink plenty of water while you exercise to prevent dehydration or heat stroke. Work out until your breathing and your heartbeat get faster.       Patient Stated  (pt-stated)      Patient stated that she would like to be able to walk better.      Patient Stated      She would like to work on walking better.         This is a list of the screening recommended for you and due dates:  Health Maintenance  Topic Date Due   Zoster (Shingles) Vaccine (1 of 2) 12/23/2010   DTaP/Tdap/Td vaccine (2 - Td or Tdap) 10/28/2020   COVID-19 Vaccine (3 - 2024-25 season) 01/04/2023   Eye exam for diabetics  02/25/2023   Flu Shot  12/04/2023   Complete foot exam   12/04/2023   Hemoglobin A1C  12/06/2023   Yearly kidney function blood test for diabetes  06/07/2024   Yearly kidney health urinalysis for diabetes  06/07/2024   Medicare Annual Wellness Visit  08/19/2024   Pneumonia Vaccine  Completed   DEXA scan (bone density measurement)  Completed   Hepatitis C Screening  Completed   HPV Vaccine  Aged Out   Meningitis B Vaccine  Aged Out   Colon Cancer Screening  Discontinued

## 2023-08-20 NOTE — Progress Notes (Signed)
 Subjective:   Shelby Thomas is a 78 y.o. female who presents for Medicare Annual (Subsequent) preventive examination.  Visit Complete: Virtual I connected with  Shelby Thomas on 08/20/23 by a audio enabled telemedicine application and verified that I am speaking with the correct person using two identifiers.  Patient Location: Home  Provider Location: Office/Clinic  I discussed the limitations of evaluation and management by telemedicine. The patient expressed understanding and agreed to proceed.  Vital Signs: Because this visit was a virtual/telehealth visit, some criteria may be missing or patient reported. Any vitals not documented were not able to be obtained and vitals that have been documented are patient reported.  Patient Medicare AWV questionnaire was completed by the patient on n/a; I have confirmed that all information answered by patient is correct and no changes since this date.  Cardiac Risk Factors include: advanced age (>49men, >87 women);diabetes mellitus;dyslipidemia;obesity (BMI >30kg/m2);sedentary lifestyle;family history of premature cardiovascular disease     Objective:    Today's Vitals   08/20/23 1343  Weight: 198 lb (89.8 kg)  Height: 5' 3.5" (1.613 m)   Body mass index is 34.52 kg/m.     08/20/2023    1:55 PM 08/11/2022    3:44 PM 08/05/2021    3:24 PM 07/23/2020   11:02 AM 03/02/2014   11:20 AM 07/29/2013   11:00 AM  Advanced Directives  Does Patient Have a Medical Advance Directive? No No No No No Patient does not have advance directive;Patient would like information  Would patient like information on creating a medical advance directive? No - Patient declined No - Patient declined No - Patient declined No - Patient declined No - patient declined information Advance directive packet given    Current Medications (verified) Outpatient Encounter Medications as of 08/20/2023  Medication Sig   Alcohol Swabs (B-D SINGLE USE SWABS REGULAR) PADS Use to  clean skin 3 times daily to check glucose.   AMBULATORY NON FORMULARY MEDICATION Medication Name: Oxygen Tubing for overnight O2. G47.34 I27.20 Fax:[phone]   ascorbic acid (VITAMIN C) 250 MG CHEW Chew 250 mg by mouth daily.   blood glucose meter kit and supplies Dispense based on patient and insurance preference. Use daily as directed. E11.69   Blood Glucose Monitoring Suppl (TRUE METRIX AIR GLUCOSE METER) DEVI Check glucose 3 times daily.   calcium citrate (CALCITRATE - DOSED IN MG ELEMENTAL CALCIUM) 950 (200 Ca) MG tablet Take 200 mg of elemental calcium by mouth daily.   ferrous sulfate 324 MG TBEC Take 1 tablet by mouth daily with breakfast.   glucose blood (TRUE METRIX BLOOD GLUCOSE TEST) test strip Check glucose 3 times daily.   metFORMIN (GLUCOPHAGE-XR) 500 MG 24 hr tablet TAKE 2 TABLETS TWICE DAILY WITH MEALS   Multiple Vitamin (MULTIVITAMIN) capsule Take 1 capsule by mouth daily.   Omega-3 Fatty Acids (FISH OIL) 1000 MG CAPS Take 1,000 mg by mouth daily.   pantoprazole (PROTONIX) 40 MG tablet Take 1 tablet (40 mg total) by mouth daily.   simvastatin (ZOCOR) 40 MG tablet Take 1 tablet (40 mg total) by mouth daily.   TRUEplus Lancets 30G MISC Check glucose 3 times daily.   diclofenac Sodium (VOLTAREN) 1 % GEL Apply 2 g topically 4 (four) times daily. Up to 4 joints (Patient not taking: Reported on 08/20/2023)   No facility-administered encounter medications on file as of 08/20/2023.    Allergies (verified) Ramipril and Tramadol   History: Past Medical History:  Diagnosis Date   BRBPR (  bright red blood per rectum) 05/05/2019   CHF (congestive heart failure) (HCC)    mild   Diabetes mellitus (HCC)    Foraminal stenosis of lumbar region    L4--L5   GERD (gastroesophageal reflux disease)    Heart disease, congenital    large ASD, Sees Dr. Odean Bend   Hiatal hernia    Hyperlipidemia    dyslipidemia   Hypertension    pulmonary/on nighttime O2   [employer] association     moderate to severe   OSA (obstructive sleep apnea)    Pneumonia 05/05/2004   Scoliosis    severe   Spine misalignment    T spine 80% to right, L-Spine 55% to left   Past Surgical History:  Procedure Laterality Date   CARDIAC SURGERY     repair of ASD   ESOPHAGEAL DILATION  04-09   dilation/Dr. Janna Melter   REPLACEMENT TOTAL KNEE Left 12/08/13   Dr. Dionne Frederick   TOTAL HIP ARTHROPLASTY Left    Family History  Problem Relation Age of Onset   Diabetes Mother    Stroke Mother    CAD Mother        CABG in her 42s   Social History   Socioeconomic History   Marital status: Widowed    Spouse name: Alveta Awe   Number of children: 2   Years of education: 14.5   Highest education level: Some college, no degree  Occupational History    Comment: Retired.  Tobacco Use   Smoking status: Never   Smokeless tobacco: Never  Vaping Use   Vaping status: Never Used  Substance and Sexual Activity   Alcohol use: No   Drug use: Never   Sexual activity: Not on file  Other Topics Concern   Not on file  Social History Narrative   Lives alone. She is widowed. Her son comes by often to check on her. She works on her tablet and does puzzles.   Social Drivers of Corporate investment banker Strain: Low Risk  (08/20/2023)   Overall Financial Resource Strain (CARDIA)    Difficulty of Paying Living Expenses: Not hard at all  Food Insecurity: No Food Insecurity (08/20/2023)   Hunger Vital Sign    Worried About Running Out of Food in the Last Year: Never true    Ran Out of Food in the Last Year: Never true  Transportation Needs: Unmet Transportation Needs (08/20/2023)   PRAPARE - Transportation    Lack of Transportation (Medical): No    Lack of Transportation (Non-Medical): Yes  Physical Activity: Inactive (08/20/2023)   Exercise Vital Sign    Days of Exercise per Week: 0 days    Minutes of Exercise per Session: 0 min  Stress: No Stress Concern Present (08/20/2023)   [employer] of  Occupational Health - Occupational Stress Questionnaire    Feeling of Stress : Not at all  Social Connections: Moderately Isolated (08/20/2023)   Social Connection and Isolation Panel [NHANES]    Frequency of Communication with Friends and Family: More than three times a week    Frequency of Social Gatherings with Friends and Family: More than three times a week    Attends Religious Services: 1 to 4 times per year    Active Member of [employer] or Organizations: No    Attends Banker Meetings: Never    Marital Status: Widowed    Tobacco Counseling Counseling given: Not Answered   Clinical Intake:  Pre-visit preparation completed: Yes  Pain :  No/denies pain     BMI - recorded: 34.52 Nutritional Status: BMI > 30  Obese Nutritional Risks: None Diabetes: Yes CBG done?: No Did pt. bring in CBG monitor from home?: No  How often do you need to have someone help you when you read instructions, pamphlets, or other written materials from your doctor or pharmacy?: 1 - Never What is the last grade level you completed in school?: 14  Interpreter Needed?: No      Activities of Daily Living    08/20/2023    1:45 PM  In your present state of health, do you have any difficulty performing the following activities:  Hearing? 1  Comment hearing aids  Vision? 0  Difficulty concentrating or making decisions? 0  Walking or climbing stairs? 1  Dressing or bathing? 0  Doing errands, shopping? 0  Preparing Food and eating ? N  Using the Toilet? N  In the past six months, have you accidently leaked urine? N  Do you have problems with loss of bowel control? N  Managing your Medications? N  Managing your Finances? N  Housekeeping or managing your Housekeeping? N    Patient Care Team: Agapito Games, MD as PCP - General (Family Medicine)  Indicate any recent Medical Services you may have received from other than [facility] providers in the past year (date may be  approximate).     Assessment:   This is a routine wellness examination for Shelby Thomas.  Hearing/Vision screen No results found.   Goals Addressed             This Visit's Progress    Patient Stated       She would like to work on walking better.        Depression Screen    08/20/2023    1:54 PM 06/08/2023    3:58 PM 08/12/2022    4:38 PM 08/11/2022    3:44 PM 02/11/2022   11:17 AM 08/05/2021    3:25 PM 06/20/2021   10:53 AM  PHQ 2/9 Scores  PHQ - 2 Score 0 2 1 0 0 0 0  PHQ- 9 Score  3 2        Fall Risk    08/20/2023    1:56 PM 08/12/2022   10:38 AM 08/11/2022    3:44 PM 05/14/2022   10:10 AM 02/11/2022   11:17 AM  Fall Risk   Falls in the past year? 0 0 0 0 0  Number falls in past yr: 0 0 0 0 0  Injury with Fall? 0 0 0 0 0  Risk for fall due to : Impaired mobility No Fall Risks No Fall Risks Impaired mobility No Fall Risks  Follow up Falls evaluation completed Falls evaluation completed Falls evaluation completed Falls evaluation completed Falls evaluation completed    MEDICARE RISK AT HOME: Medicare Risk at Home Any stairs in or around the home?: Yes If so, are there any without handrails?: Yes Home free of loose throw rugs in walkways, pet beds, electrical cords, etc?: Yes Adequate lighting in your home to reduce risk of falls?: Yes Life alert?: Yes Use of a cane, walker or w/c?: Yes Grab bars in the bathroom?: Yes Shower chair or bench in shower?: Yes Elevated toilet seat or a handicapped toilet?: Yes  TIMED UP AND GO:  Was the test performed?  No    Cognitive Function:        08/20/2023    1:58 PM 08/11/2022  3:50 PM 08/05/2021    3:32 PM 07/23/2020   11:11 AM  6CIT Screen  What Year? 0 points 0 points 0 points 0 points  What month? 0 points 0 points 0 points 0 points  What time? 0 points 0 points 0 points 0 points  Count back from 20 0 points 0 points 0 points 2 points  Months in reverse 0 points 0 points 0 points 0 points  Repeat phrase 2 points 2  points 2 points 2 points  Total Score 2 points 2 points 2 points 4 points    Immunizations Immunization History  Administered Date(s) Administered   Fluad Quad(high Dose 65+) 12/27/2018, 01/11/2020, 06/20/2021, 02/11/2022   Influenza Whole 04/02/2006, 04/19/2007   Influenza, High Dose Seasonal PF 12/24/2016, 02/18/2018   Influenza, Seasonal, Injecte, Preservative Fre 03/31/2012   Influenza,inj,Quad PF,6+ Mos 02/28/2013, 03/02/2014, 03/20/2015, 12/24/2015   PFIZER(Purple Top)SARS-COV-2 Vaccination 07/06/2019, 07/27/2019   Pneumococcal Conjugate-13 11/17/2014   Pneumococcal Polysaccharide-23 04/02/2006, 07/01/2012   Tdap 10/29/2010   Zoster, Live 12/23/2010    TDAP status: Due, Education has been provided regarding the importance of this vaccine. Advised may receive this vaccine at local pharmacy or Health Dept. Aware to provide a copy of the vaccination record if obtained from local pharmacy or Health Dept. Verbalized acceptance and understanding.  Flu Vaccine status: Declined, Education has been provided regarding the importance of this vaccine but patient still declined. Advised may receive this vaccine at local pharmacy or Health Dept. Aware to provide a copy of the vaccination record if obtained from local pharmacy or Health Dept. Verbalized acceptance and understanding.  Pneumococcal vaccine status: Up to date  Covid-19 vaccine status: Declined, Education has been provided regarding the importance of this vaccine but patient still declined. Advised may receive this vaccine at local pharmacy or Health Dept.or vaccine clinic. Aware to provide a copy of the vaccination record if obtained from local pharmacy or Health Dept. Verbalized acceptance and understanding.  Qualifies for Shingles Vaccine? Yes   Zostavax completed Yes   Shingrix Completed?: No.    Education has been provided regarding the importance of this vaccine. Patient has been advised to call insurance company to determine  out of pocket expense if they have not yet received this vaccine. Advised may also receive vaccine at local pharmacy or Health Dept. Verbalized acceptance and understanding.  Screening Tests Health Maintenance  Topic Date Due   Zoster Vaccines- Shingrix (1 of 2) 12/23/2010   DTaP/Tdap/Td (2 - Td or Tdap) 10/28/2020   COVID-19 Vaccine (3 - 2024-25 season) 01/04/2023   OPHTHALMOLOGY EXAM  02/25/2023   INFLUENZA VACCINE  12/04/2023   FOOT EXAM  12/04/2023   HEMOGLOBIN A1C  12/06/2023   Diabetic kidney evaluation - eGFR measurement  06/07/2024   Diabetic kidney evaluation - Urine ACR  06/07/2024   Medicare Annual Wellness (AWV)  08/19/2024   Pneumonia Vaccine 2+ Years old  Completed   DEXA SCAN  Completed   Hepatitis C Screening  Completed   HPV VACCINES  Aged Out   Meningococcal B Vaccine  Aged Out   Colonoscopy  Discontinued    Health Maintenance  Health Maintenance Due  Topic Date Due   Zoster Vaccines- Shingrix (1 of 2) 12/23/2010   DTaP/Tdap/Td (2 - Td or Tdap) 10/28/2020   COVID-19 Vaccine (3 - 2024-25 season) 01/04/2023   OPHTHALMOLOGY EXAM  02/25/2023    Colorectal cancer screening: No longer required.   Mammogram status: No longer required due to age.  Bone Density  status: Completed 08/15/2020. Results reflect: Bone density results: NORMAL. Repeat every 2 years.  Lung Cancer Screening: (Low Dose CT Chest recommended if Age 12-80 years, 20 pack-year currently smoking OR have quit w/in 15years.) does not qualify.   Lung Cancer Screening Referral: n/a  Additional Screening:  Hepatitis C Screening: does not qualify; Completed 03/20/2015  Vision Screening: Recommended annual ophthalmology exams for early detection of glaucoma and other disorders of the eye. Is the patient up to date with their annual eye exam?  Yes  Who is the provider or what is the name of the office in which the patient attends annual eye exams? [facility] [address] If pt is not established  with a provider, would they like to be referred to a provider to establish care?  N/a .   Dental Screening: Recommended annual dental exams for proper oral hygiene  Diabetic Foot Exam: Diabetic Foot Exam: Completed 12/04/2022  Community Resource Referral / Chronic Care Management: CRR required this visit?  Yes   CCM required this visit?  No     Plan:     I have personally reviewed and noted the following in the patient's chart:   Medical and social history Use of alcohol, tobacco or illicit drugs  Current medications and supplements including opioid prescriptions. Patient is not currently taking opioid prescriptions. Functional ability and status Nutritional status Physical activity Advanced directives List of other physicians Hospitalizations, surgeries, and ER visits in previous 12 months. None Vitals Screenings to include cognitive, depression, and falls Referrals and appointments  In addition, I have reviewed and discussed with patient certain preventive protocols, quality metrics, and best practice recommendations. A written personalized care plan for preventive services as well as general preventive health recommendations were provided to patient.     Aubrey Leaf, CMA   08/20/2023   After Visit Summary: (Mail) Due to this being a telephonic visit, the after visit summary with patients personalized plan was offered to patient via mail   Nurse Notes:   Shelby Thomas is a 78 y.o. female patient of Metheney, Corita Diego, MD who had a Medicare Annual Wellness Visit today via telephone. Shelby Thomas is Retired and lives with their son. She has 2 children. She reports that she is socially active and does interact with friends/family regularly. She is minimally physically active and enjoys working on tablet and doing puzzles.   She does have to depend on her son to drive her to appointments and to do errands. She states he cannot always take her when needed.   Referral  placed for transportation.   Sent fax for most recent eye exam.

## 2023-08-25 ENCOUNTER — Telehealth: Payer: Self-pay | Admitting: *Deleted

## 2023-08-25 NOTE — Progress Notes (Signed)
 Complex Care Management Note  Care Guide Note 08/25/2023 Name: MAYURI STAPLES MRN: [id number] DOB: [birth year]  Saralee Cummins is a 78 y.o. year old female who sees Metheney, Corita Diego, MD for primary care. I reached out to Saralee Cummins by phone today to offer complex care management services.  Ms. Lumadue was given information about Complex Care Management services today including:   The Complex Care Management services include support from the care team which includes your Nurse Care Manager, Clinical Social Worker, or Pharmacist.  The Complex Care Management team is here to help remove barriers to the health concerns and goals most important to you. Complex Care Management services are voluntary, and the patient may decline or stop services at any time by request to their care team member.   Complex Care Management Consent Status: Patient agreed to services and verbal consent obtained.   Follow up plan:  Telephone appointment with complex care management team member scheduled for:  09/02/2023  Encounter Outcome:  Patient Scheduled  Kandis Ormond, CMA [place]  [facility], [facility] Guide Direct Dial: [phone]  Fax: [phone] Website: [email or web address]

## 2023-08-31 ENCOUNTER — Other Ambulatory Visit: Payer: Self-pay | Admitting: Family Medicine

## 2023-08-31 DIAGNOSIS — E7800 Pure hypercholesterolemia, unspecified: Secondary | ICD-10-CM

## 2023-08-31 DIAGNOSIS — K2101 Gastro-esophageal reflux disease with esophagitis, with bleeding: Secondary | ICD-10-CM

## 2023-09-02 ENCOUNTER — Other Ambulatory Visit: Payer: Self-pay | Admitting: Licensed Clinical Social Worker

## 2023-09-02 NOTE — Patient Instructions (Signed)
 Visit Information  Thank you for taking time to visit with me today. Please don't hesitate to contact me if I can be of assistance to you before our next scheduled appointment.  Our next appointment is no further scheduled appointments.    Please call the care guide team at [phone] if you need to cancel or reschedule your appointment.   Following is a copy of your care plan:   Goals Addressed             This Visit's Progress    VBCI Social Work Care Plan       Problems:   Patient did not have an SDOH Needs but wanted the SW to contact the PCP about an order for a lift chair  CSW Clinical Goal(s):   NONE  Interventions:  SW will in basket  PCP regarding lift chair order  Patient Goals/Self-Care Activities:  Coordinate with PCP  to assist with lift chair.  Plan:   No further follow up         Please call the Suicide and Crisis Lifeline: 988 go to [facility] [address], [place] [phone]) call 911 if you are experiencing a Mental Health or Behavioral Health Crisis or need someone to talk to.  The patient verbalized understanding of instructions, educational materials, and care plan provided today and DECLINED offer to receive copy of patient instructions, educational materials, and care plan.   Jonda Neighbours, PhD [facility], [facility] Social Worker Direct Dial: [phone]  Fax: [phone]

## 2023-09-02 NOTE — Patient Outreach (Signed)
 Complex Care Management   Visit Note  09/02/2023  Name:  Shelby Thomas MRN: [id number] DOB: 1945-08-06  Situation: Referral received for Complex Care Management related to  Lift Chair  I obtained verbal consent from Patient.  Visit completed with patient   on the phone  Background:   Past Medical History:  Diagnosis Date   BRBPR (bright red blood per rectum) 05/05/2019   CHF (congestive heart failure) (HCC)    mild   Diabetes mellitus (HCC)    Foraminal stenosis of lumbar region    L4--L5   GERD (gastroesophageal reflux disease)    Heart disease, congenital    large ASD, Sees Dr. 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

## 2023-10-05 ENCOUNTER — Telehealth: Payer: Self-pay

## 2023-10-05 DIAGNOSIS — R29898 Other symptoms and signs involving the musculoskeletal system: Secondary | ICD-10-CM

## 2023-10-05 NOTE — Telephone Encounter (Signed)
 Forwarding message to [facility] covering Dr. Greer Leak

## 2023-10-05 NOTE — Telephone Encounter (Signed)
 Copied from CRM [id number]. Topic: Clinical - Medical Advice >> Oct 05, 2023 11:43 AM Kevelyn M wrote: Reason for CRM: Patient's son Earla Glassman called in to find out if his mother can get assistance everyday because she's fall risk. He is concerned because her house has has stairs. Call back #[phone]

## 2023-10-06 NOTE — Telephone Encounter (Signed)
 Pended referral for this.  Not sure of completion details.

## 2023-10-06 NOTE — Telephone Encounter (Signed)
 Patient has a complex care management team. Can we reach out to them and see if this is something they can arrange?

## 2023-10-06 NOTE — Telephone Encounter (Signed)
 Referral made and they saw her on 4/30. Ami Kail is contact.   Ethelle Herb can you help with this patient's son request of potential help in home for patient?

## 2023-10-13 ENCOUNTER — Ambulatory Visit: Payer: Medicare PPO | Admitting: Family Medicine

## 2023-10-16 NOTE — Addendum Note (Signed)
 Addended by: Dickie Found on: 10/16/2023 09:59 AM   Modules accepted: Orders

## 2023-10-16 NOTE — Telephone Encounter (Signed)
 Pended referral

## 2023-10-16 NOTE — Telephone Encounter (Signed)
 To PCP

## 2023-10-16 NOTE — Addendum Note (Signed)
 Addended by: Amali Uhls D on: 10/16/2023 12:07 PM   Modules accepted: Orders

## 2023-10-23 ENCOUNTER — Telehealth: Payer: Self-pay | Admitting: *Deleted

## 2023-10-23 NOTE — Progress Notes (Unsigned)
 Complex Care Management Note Care Guide Note  10/23/2023 Name: Shelby Thomas MRN: [id number] DOB: 1945-08-13   Complex Care Management Outreach Attempts: An unsuccessful telephone outreach was attempted today to offer the patient information about available complex care management services.  Follow Up Plan:  Additional outreach attempts will be made to offer the patient complex care management information and services.   Encounter Outcome:  No Answer  Kandis Ormond, CMA [place]  [facility], [facility] Guide Direct Dial: [phone]  Fax: [phone] Website: [email or web address]

## 2023-10-26 NOTE — Progress Notes (Signed)
 Complex Care Management Note  Care Guide Note 10/26/2023 Name: JAMACIA JESTER MRN: [id number] DOB: 11/22/1945  Donia DELENA Popp is a 78 y.o. year old female who sees Metheney, Dorothyann BIRCH, MD for primary care. I reached out to Donia DELENA Popp by phone today to offer complex care management services.  Ms. Salsberry was given information about Complex Care Management services today including:   The Complex Care Management services include support from the care team which includes your Nurse Care Manager, Clinical Social Worker, or Pharmacist.  The Complex Care Management team is here to help remove barriers to the health concerns and goals most important to you. Complex Care Management services are voluntary, and the patient may decline or stop services at any time by request to their care team member.   Complex Care Management Consent Status: Patient agreed to services and verbal consent obtained.   Follow up plan:  Telephone appointment with complex care management team member scheduled for:  10/28/2023  Encounter Outcome:  Patient Scheduled  Thedford Franks, CMA [place]  [facility], [facility] Guide Direct Dial: [phone]  Fax: [phone] Website: [email or web address]

## 2023-10-28 ENCOUNTER — Other Ambulatory Visit: Payer: Self-pay

## 2023-10-28 NOTE — Patient Outreach (Signed)
 Complex Care Management   Visit Note  10/28/2023  Name:  Shelby Thomas MRN: [id number] DOB: Sep 23, 1945  Situation: Referral received for Complex Care Management related to SDOH Barriers:  Transportation I obtained verbal consent from Patient.  Visit completed with patient  on the phone  Background:   Past Medical History:  Diagnosis Date   BRBPR (bright red blood per rectum) 05/05/2019   CHF (congestive heart failure) (HCC)    mild   Diabetes mellitus (HCC)    Foraminal stenosis of lumbar region    L4--L5   GERD (gastroesophageal reflux disease)    Heart disease, congenital    large ASD, Sees Dr. 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  Patient declines assessment.  Patient will contact provider if SW services are needed.  SDOH Interventions    Flowsheet Row Patient Outreach Telephone from 09/02/2023 in [place] [facility]PART[facility] Clinical Support from 08/20/2023 in [facility] Primary Care & Sports Medicine at [facility] Office Visit from 06/08/2023 in [facility] Primary Care & Sports Medicine at [facility] Office Visit from 08/12/2022 in [facility] Primary Care & Sports Medicine at [facility] Office Visit from 05/14/2022 in [facility] Primary Care & Sports Medicine at [facility] Office Visit from 02/11/2022 in [facility] Primary Care & Sports Medicine at [facility]  SDOH Interventions        Food Insecurity Interventions -- Intervention Not Indicated -- -- -- --  Housing Interventions --  Intervention Not Indicated -- -- -- --  Transportation Interventions Intervention Not Indicated Community Resources Provided -- -- -- --  Utilities Interventions -- Intervention Not Indicated -- -- -- --  Alcohol Usage Interventions -- Intervention Not Indicated (Score <7) -- -- -- --  Depression Interventions/Treatment  -- -- PHQ2-9 Score <4 Follow-up Not Indicated PHQ2-9 Score <4 Follow-up Not Indicated PHQ2-9 Score <4 Follow-up Not Indicated PHQ2-9 Score <4 Follow-up Not Indicated  Financial Strain Interventions -- Intervention Not Indicated -- -- -- --  Physical Activity Interventions -- Other (Comments)  [She walks with a walker.] -- -- -- --  Stress Interventions -- Intervention Not Indicated -- -- -- --  Social Connections Interventions -- Intervention Not Indicated -- -- -- --      Recommendation:   None  Follow Up Plan:   Patient has met all care management goals. Care Management case will be closed. Patient has been provided contact information should new needs arise.   Tillman Gardener, BSW [place]  [facility], [facility] Social Worker Direct Dial: [phone]  Fax: [phone] Website: [email or web address]

## 2023-10-28 NOTE — Patient Instructions (Signed)
 Visit Information  Thank you for taking time to visit with me today. Please don't hesitate to contact me if I can be of assistance to you before our next scheduled appointment.    Following is a copy of your care plan:   Goals Addressed   None     Please call 911 if you are experiencing a Mental Health or Behavioral Health Crisis or need someone to talk to.  Patient verbalizes understanding of instructions and care plan provided today and agrees to view in MyChart. Active MyChart status and patient understanding of how to access instructions and care plan via MyChart confirmed with patient.     Shelby Thomas, BSW [place]  [facility], [facility] Social Worker Direct Dial: [phone]  Fax: [phone] Website: [email or web address]

## 2023-11-09 ENCOUNTER — Ambulatory Visit: Admitting: Family Medicine

## 2023-11-11 NOTE — Progress Notes (Signed)
 Established Patient Office Visit  Subjective  Patient ID: Shelby Thomas, female    DOB: 03-07-1946  Age: 78 y.o. MRN: [id number]  Chief Complaint  Patient presents with   Diabetes    HPI  Diabetes - no hypoglycemic events. No wounds or sores that are not healing well. No increased thirst or urination. Checking glucose at home. Taking medications as prescribed without any side effects. She has really been trying to do well with her diet  She has a history of severe scoliosis and says that occasionally her right shoulder in particular bothers her.  It is not constant but occasionally gets little sore painful.  She has noticed some decreased range of motion in her shoulders she is not fully able to extend like she used to she does try to get up and move around the house but she does live by herself.  She has a walker that she uses indoors sometimes her knees and her hips bother her.  She has had a left hip and left knee replacement.  Her son [name] is here with her today.  She still has her husband listed as her emergency contact and DPR and wanted to get that updated today for her son [name].     ROS    Objective:     BP 136/82   Ht 5' 4 (1.626 m)   BMI 33.99 kg/m    Physical Exam Vitals and nursing note reviewed.  Constitutional:      Appearance: Normal appearance.  HENT:     Head: Normocephalic and atraumatic.  Eyes:     Conjunctiva/sclera: Conjunctivae normal.  Cardiovascular:     Rate and Rhythm: Normal rate and regular rhythm.  Pulmonary:     Effort: Pulmonary effort is normal.     Breath sounds: Normal breath sounds.  Skin:    General: Skin is warm and dry.  Neurological:     Mental Status: She is alert.  Psychiatric:        Mood and Affect: Mood normal.      Results for orders placed or performed in visit on 11/12/23  CMP14+EGFR  Result Value Ref Range   Glucose 140 (H) 70 - 99 mg/dL   BUN 6 (L) 8 - 27 mg/dL   Creatinine, Ser 9.30 0.57 - 1.00  mg/dL   eGFR 90 >40 fO/fpw/8.26   BUN/Creatinine Ratio 9 (L) 12 - 28   Sodium 145 (H) 134 - 144 mmol/L   Potassium 4.4 3.5 - 5.2 mmol/L   Chloride 103 96 - 106 mmol/L   CO2 25 20 - 29 mmol/L   Calcium  9.6 8.7 - 10.3 mg/dL   Total Protein 6.8 6.0 - 8.5 g/dL   Albumin 3.8 3.8 - 4.8 g/dL   Globulin, Total 3.0 1.5 - 4.5 g/dL   Bilirubin Total 0.5 0.0 - 1.2 mg/dL   Alkaline Phosphatase 79 44 - 121 IU/L   AST 16 0 - 40 IU/L   ALT 9 0 - 32 IU/L  Iron, TIBC and Ferritin Panel  Result Value Ref Range   Total Iron Binding Capacity 264 250 - 450 ug/dL   UIBC 814 881 - 630 ug/dL   Iron 79 27 - 860 ug/dL   Iron Saturation 30 15 - 55 %   Ferritin 106 15 - 150 ng/mL  POCT HgB A1C  Result Value Ref Range   Hemoglobin A1C 6.6 (A) 4.0 - 5.6 %   HbA1c POC (<> result, manual entry)  HbA1c, POC (prediabetic range)     HbA1c, POC (controlled diabetic range)        The 10-year ASCVD risk score (Arnett DK, et al., 2019) is: 42.3%    Assessment & Plan:   Problem List Items Addressed This Visit       Cardiovascular and Mediastinum   Aortic atherosclerosis (HCC)   Consider switching to a more potent statin such as atorvastatin or rosuvastatin .  In place of send the.  Lab Results  Component Value Date   CHOL 205 (H) 06/08/2023   HDL 61 06/08/2023   LDLCALC 127 (H) 06/08/2023   LDLDIRECT 105 (H) 11/23/2008   TRIG 96 06/08/2023   CHOLHDL 3.4 06/08/2023           Digestive   GERD   Ports that her symptoms are really well-controlled on omeprazole  so Ragona try decreasing down to 20 mg to see if she tolerates that well.  She does have a history of an esophageal stricture.      Relevant Medications   pantoprazole  (PROTONIX ) 20 MG tablet     Endocrine   Type 2 diabetes mellitus with obesity (HCC) - Primary   A1C looks fantastic at 6.6 today.       Relevant Orders   CMP14+EGFR (Completed)   Iron, TIBC and Ferritin Panel (Completed)   POCT HgB A1C (Completed)     Other    Iron deficiency anemia due to chronic blood loss   Relevant Orders   Iron, TIBC and Ferritin Panel (Completed)   Other Visit Diagnoses       Chronic pain of both shoulders       Relevant Orders   Ambulatory referral to Home Health     Physical deconditioning       Relevant Orders   Ambulatory referral to Home Health     Bilateral hip pain       Relevant Orders   Ambulatory referral to Home Health     Chronic pain of both knees       Relevant Orders   Ambulatory referral to Home Health      We discussed getting home health to come out and help do some PT for her shoulders hips and knees to see if this would help her with mobility.  Return in about 4 months (around 03/14/2024) for Diabetes follow-up.    Dorothyann Byars, MD

## 2023-11-12 ENCOUNTER — Ambulatory Visit (INDEPENDENT_AMBULATORY_CARE_PROVIDER_SITE_OTHER): Admitting: Family Medicine

## 2023-11-12 ENCOUNTER — Encounter: Payer: Self-pay | Admitting: Family Medicine

## 2023-11-12 VITALS — BP 136/82 | Ht 64.0 in

## 2023-11-12 DIAGNOSIS — G8929 Other chronic pain: Secondary | ICD-10-CM | POA: Diagnosis not present

## 2023-11-12 DIAGNOSIS — I700 Atherosclerosis of aorta: Secondary | ICD-10-CM

## 2023-11-12 DIAGNOSIS — Z7984 Long term (current) use of oral hypoglycemic drugs: Secondary | ICD-10-CM

## 2023-11-12 DIAGNOSIS — D500 Iron deficiency anemia secondary to blood loss (chronic): Secondary | ICD-10-CM

## 2023-11-12 DIAGNOSIS — M25551 Pain in right hip: Secondary | ICD-10-CM

## 2023-11-12 DIAGNOSIS — R5381 Other malaise: Secondary | ICD-10-CM | POA: Diagnosis not present

## 2023-11-12 DIAGNOSIS — E1169 Type 2 diabetes mellitus with other specified complication: Secondary | ICD-10-CM

## 2023-11-12 DIAGNOSIS — M25511 Pain in right shoulder: Secondary | ICD-10-CM

## 2023-11-12 DIAGNOSIS — K219 Gastro-esophageal reflux disease without esophagitis: Secondary | ICD-10-CM

## 2023-11-12 DIAGNOSIS — E669 Obesity, unspecified: Secondary | ICD-10-CM | POA: Diagnosis not present

## 2023-11-12 LAB — POCT GLYCOSYLATED HEMOGLOBIN (HGB A1C): Hemoglobin A1C: 6.6 % — AB (ref 4.0–5.6)

## 2023-11-12 MED ORDER — PANTOPRAZOLE SODIUM 20 MG PO TBEC: 20.0000 mg | DELAYED_RELEASE_TABLET | Freq: Every day | ORAL | 3 refills | Status: AC

## 2023-11-12 NOTE — Assessment & Plan Note (Signed)
 A1C looks fantastic at 6.6 today.

## 2023-11-12 NOTE — Patient Instructions (Signed)
 You are doing an absolutely fantastic job with your diabetes. Try decreasing your heartburn pill down to 20 mg and see if it is just as effective. We have ordered physical therapy through home health to come out. If you do not hear from them by next week please let us  know.

## 2023-11-12 NOTE — Assessment & Plan Note (Signed)
 Ports that her symptoms are really well-controlled on omeprazole  so Ragona try decreasing down to 20 mg to see if she tolerates that well.  She does have a history of an esophageal stricture.

## 2023-11-13 ENCOUNTER — Telehealth: Payer: Self-pay | Admitting: Family Medicine

## 2023-11-13 ENCOUNTER — Ambulatory Visit: Payer: Self-pay | Admitting: Family Medicine

## 2023-11-13 DIAGNOSIS — E669 Obesity, unspecified: Secondary | ICD-10-CM

## 2023-11-13 DIAGNOSIS — I700 Atherosclerosis of aorta: Secondary | ICD-10-CM

## 2023-11-13 LAB — CMP14+EGFR
ALT: 9 IU/L (ref 0–32)
AST: 16 IU/L (ref 0–40)
Albumin: 3.8 g/dL (ref 3.8–4.8)
Alkaline Phosphatase: 79 IU/L (ref 44–121)
BUN/Creatinine Ratio: 9 — ABNORMAL LOW (ref 12–28)
BUN: 6 mg/dL — ABNORMAL LOW (ref 8–27)
Bilirubin Total: 0.5 mg/dL (ref 0.0–1.2)
CO2: 25 mmol/L (ref 20–29)
Calcium: 9.6 mg/dL (ref 8.7–10.3)
Chloride: 103 mmol/L (ref 96–106)
Creatinine, Ser: 0.69 mg/dL (ref 0.57–1.00)
Globulin, Total: 3 g/dL (ref 1.5–4.5)
Glucose: 140 mg/dL — ABNORMAL HIGH (ref 70–99)
Potassium: 4.4 mmol/L (ref 3.5–5.2)
Sodium: 145 mmol/L — ABNORMAL HIGH (ref 134–144)
Total Protein: 6.8 g/dL (ref 6.0–8.5)
eGFR: 90 mL/min/1.73 (ref >59)

## 2023-11-13 LAB — IRON,TIBC AND FERRITIN PANEL
Ferritin: 106 ng/mL (ref 15–150)
Iron Saturation: 30 % (ref 15–55)
Iron: 79 ug/dL (ref 27–139)
Total Iron Binding Capacity: 264 ug/dL (ref 250–450)
UIBC: 185 ug/dL (ref 118–369)

## 2023-11-13 NOTE — Assessment & Plan Note (Signed)
 Consider switching to a more potent statin such as atorvastatin or rosuvastatin .  In place of send the.  Lab Results  Component Value Date   CHOL 205 (H) 06/08/2023   HDL 61 06/08/2023   LDLCALC 127 (H) 06/08/2023   LDLDIRECT 105 (H) 11/23/2008   TRIG 96 06/08/2023   CHOLHDL 3.4 06/08/2023

## 2023-11-13 NOTE — Telephone Encounter (Signed)
 Please call patient and let her know I was looking back over her labs from the fall.  I would really like to consider switching her simvastatin  to rosuvastatin  the little bit more powerful to reduce her LDL and is more effective.  It still generic and it still once daily.  If she is okay with trying to make the switch I am happy to send in a new prescription for her just let me know.

## 2023-11-13 NOTE — Progress Notes (Signed)
 Hi Ms. Carlino, kidney function looks stable.  Sodium level was just off by 1 point so we will continue to monitor liver enzymes look great iron levels look good so if you are still taking extra iron you can decrease how often maybe couple times a week instead of daily.  If you are not taking any extra iron then that is perfectly fine you do not need to start any.  Let us  know when you get your eye exam updated and we will get that put in the chart.  Also encourage you to consider getting your tetanus and your shingles vaccine updated at the local pharmacy next time you go.

## 2023-11-16 DIAGNOSIS — M25552 Pain in left hip: Secondary | ICD-10-CM | POA: Diagnosis not present

## 2023-11-16 DIAGNOSIS — M25562 Pain in left knee: Secondary | ICD-10-CM | POA: Diagnosis not present

## 2023-11-16 DIAGNOSIS — M25511 Pain in right shoulder: Secondary | ICD-10-CM | POA: Diagnosis not present

## 2023-11-16 DIAGNOSIS — E1136 Type 2 diabetes mellitus with diabetic cataract: Secondary | ICD-10-CM | POA: Diagnosis not present

## 2023-11-16 DIAGNOSIS — M419 Scoliosis, unspecified: Secondary | ICD-10-CM | POA: Diagnosis not present

## 2023-11-16 DIAGNOSIS — G8929 Other chronic pain: Secondary | ICD-10-CM | POA: Diagnosis not present

## 2023-11-16 DIAGNOSIS — M25561 Pain in right knee: Secondary | ICD-10-CM | POA: Diagnosis not present

## 2023-11-16 DIAGNOSIS — M25551 Pain in right hip: Secondary | ICD-10-CM | POA: Diagnosis not present

## 2023-11-16 DIAGNOSIS — M25512 Pain in left shoulder: Secondary | ICD-10-CM | POA: Diagnosis not present

## 2023-11-16 MED ORDER — ROSUVASTATIN CALCIUM 40 MG PO TABS: 40.0000 mg | ORAL_TABLET | Freq: Every day | ORAL | 3 refills | Status: DC

## 2023-11-16 NOTE — Telephone Encounter (Signed)
 New prescription sent

## 2023-11-17 ENCOUNTER — Telehealth: Payer: Self-pay

## 2023-11-17 DIAGNOSIS — M25551 Pain in right hip: Secondary | ICD-10-CM | POA: Diagnosis not present

## 2023-11-17 DIAGNOSIS — M25562 Pain in left knee: Secondary | ICD-10-CM | POA: Diagnosis not present

## 2023-11-17 DIAGNOSIS — M25552 Pain in left hip: Secondary | ICD-10-CM | POA: Diagnosis not present

## 2023-11-17 DIAGNOSIS — G8929 Other chronic pain: Secondary | ICD-10-CM | POA: Diagnosis not present

## 2023-11-17 DIAGNOSIS — M25561 Pain in right knee: Secondary | ICD-10-CM | POA: Diagnosis not present

## 2023-11-17 DIAGNOSIS — M419 Scoliosis, unspecified: Secondary | ICD-10-CM | POA: Diagnosis not present

## 2023-11-17 DIAGNOSIS — E1136 Type 2 diabetes mellitus with diabetic cataract: Secondary | ICD-10-CM | POA: Diagnosis not present

## 2023-11-17 DIAGNOSIS — M25512 Pain in left shoulder: Secondary | ICD-10-CM | POA: Diagnosis not present

## 2023-11-17 DIAGNOSIS — M25511 Pain in right shoulder: Secondary | ICD-10-CM | POA: Diagnosis not present

## 2023-11-17 NOTE — Telephone Encounter (Unsigned)
 Copied from CRM [id number]. Topic: General - Other >> Nov 17, 2023  4:05 PM Miquel SAILOR wrote: Reason for CRM: [facility] RN Requesting Need call back due to there is a Conflict with patient on what she is willing to except and what the family says she needs . [phone]

## 2023-11-18 NOTE — Telephone Encounter (Signed)
 Spoke w/Tracy and she stated that she spoke with [name]' son and his wife regarding HHC and they informed her that she is not compliant with doing the PT and she requires more care.   She stated that she is placing a referral for Social Worker to come out to evaluate this to see if she can get a sitter and also for her to possible get the care from their office.

## 2023-12-01 DIAGNOSIS — M25551 Pain in right hip: Secondary | ICD-10-CM | POA: Diagnosis not present

## 2023-12-01 DIAGNOSIS — G8929 Other chronic pain: Secondary | ICD-10-CM | POA: Diagnosis not present

## 2023-12-01 DIAGNOSIS — M25511 Pain in right shoulder: Secondary | ICD-10-CM | POA: Diagnosis not present

## 2023-12-01 DIAGNOSIS — E1136 Type 2 diabetes mellitus with diabetic cataract: Secondary | ICD-10-CM | POA: Diagnosis not present

## 2023-12-01 DIAGNOSIS — M25512 Pain in left shoulder: Secondary | ICD-10-CM | POA: Diagnosis not present

## 2023-12-01 DIAGNOSIS — M419 Scoliosis, unspecified: Secondary | ICD-10-CM | POA: Diagnosis not present

## 2023-12-01 DIAGNOSIS — M25561 Pain in right knee: Secondary | ICD-10-CM | POA: Diagnosis not present

## 2023-12-01 DIAGNOSIS — M25552 Pain in left hip: Secondary | ICD-10-CM | POA: Diagnosis not present

## 2023-12-01 DIAGNOSIS — M25562 Pain in left knee: Secondary | ICD-10-CM | POA: Diagnosis not present

## 2023-12-04 DIAGNOSIS — M419 Scoliosis, unspecified: Secondary | ICD-10-CM | POA: Diagnosis not present

## 2023-12-04 DIAGNOSIS — M25511 Pain in right shoulder: Secondary | ICD-10-CM | POA: Diagnosis not present

## 2023-12-04 DIAGNOSIS — G8929 Other chronic pain: Secondary | ICD-10-CM | POA: Diagnosis not present

## 2023-12-04 DIAGNOSIS — E1136 Type 2 diabetes mellitus with diabetic cataract: Secondary | ICD-10-CM | POA: Diagnosis not present

## 2023-12-04 DIAGNOSIS — M25512 Pain in left shoulder: Secondary | ICD-10-CM | POA: Diagnosis not present

## 2023-12-04 DIAGNOSIS — M25561 Pain in right knee: Secondary | ICD-10-CM | POA: Diagnosis not present

## 2023-12-04 DIAGNOSIS — M25562 Pain in left knee: Secondary | ICD-10-CM | POA: Diagnosis not present

## 2023-12-04 DIAGNOSIS — M25551 Pain in right hip: Secondary | ICD-10-CM | POA: Diagnosis not present

## 2023-12-04 DIAGNOSIS — M25552 Pain in left hip: Secondary | ICD-10-CM | POA: Diagnosis not present

## 2023-12-07 ENCOUNTER — Telehealth: Payer: Self-pay | Admitting: Family Medicine

## 2023-12-07 DIAGNOSIS — M25561 Pain in right knee: Secondary | ICD-10-CM | POA: Diagnosis not present

## 2023-12-07 DIAGNOSIS — M25512 Pain in left shoulder: Secondary | ICD-10-CM | POA: Diagnosis not present

## 2023-12-07 DIAGNOSIS — E1136 Type 2 diabetes mellitus with diabetic cataract: Secondary | ICD-10-CM | POA: Diagnosis not present

## 2023-12-07 DIAGNOSIS — M25511 Pain in right shoulder: Secondary | ICD-10-CM | POA: Diagnosis not present

## 2023-12-07 DIAGNOSIS — M25551 Pain in right hip: Secondary | ICD-10-CM | POA: Diagnosis not present

## 2023-12-07 DIAGNOSIS — M25552 Pain in left hip: Secondary | ICD-10-CM | POA: Diagnosis not present

## 2023-12-07 DIAGNOSIS — G8929 Other chronic pain: Secondary | ICD-10-CM | POA: Diagnosis not present

## 2023-12-07 DIAGNOSIS — M419 Scoliosis, unspecified: Secondary | ICD-10-CM | POA: Diagnosis not present

## 2023-12-07 DIAGNOSIS — M25562 Pain in left knee: Secondary | ICD-10-CM | POA: Diagnosis not present

## 2023-12-07 NOTE — Telephone Encounter (Unsigned)
 Copied from CRM [id number]. Topic: General - Other >> Nov 30, 2023  2:44 PM Mercer PEDLAR wrote: Reason for CRM: Katlyn from [facility] to confirm that we received order which was faxed in 11/26/23 for home health certification and plan of care  order number: [id number]   Callback: [phone] >> Dec 07, 2023 12:18 PM Mercer PEDLAR wrote: Katlyn is calling to check on same order because she has not heard back. Please contact with an update.

## 2023-12-08 ENCOUNTER — Telehealth: Payer: Self-pay

## 2023-12-08 NOTE — Telephone Encounter (Signed)
 Copied from CRM #[id number]. Topic: General - Other >> Dec 08, 2023  3:09 PM Adrianna P wrote: Reason for CRM: debra from [facility] sent over dme request,it got faxed back but signed documents didnt come through please refax to [phone]

## 2023-12-08 NOTE — Telephone Encounter (Signed)
 Printed and re-faxed to [facility] at number provided

## 2023-12-16 DIAGNOSIS — M25562 Pain in left knee: Secondary | ICD-10-CM | POA: Diagnosis not present

## 2023-12-16 DIAGNOSIS — M25552 Pain in left hip: Secondary | ICD-10-CM | POA: Diagnosis not present

## 2023-12-16 DIAGNOSIS — M25511 Pain in right shoulder: Secondary | ICD-10-CM | POA: Diagnosis not present

## 2023-12-16 DIAGNOSIS — M419 Scoliosis, unspecified: Secondary | ICD-10-CM | POA: Diagnosis not present

## 2023-12-16 DIAGNOSIS — G8929 Other chronic pain: Secondary | ICD-10-CM | POA: Diagnosis not present

## 2023-12-16 DIAGNOSIS — M25551 Pain in right hip: Secondary | ICD-10-CM | POA: Diagnosis not present

## 2023-12-16 DIAGNOSIS — M25561 Pain in right knee: Secondary | ICD-10-CM | POA: Diagnosis not present

## 2023-12-16 DIAGNOSIS — E1136 Type 2 diabetes mellitus with diabetic cataract: Secondary | ICD-10-CM | POA: Diagnosis not present

## 2023-12-16 DIAGNOSIS — M25512 Pain in left shoulder: Secondary | ICD-10-CM | POA: Diagnosis not present

## 2023-12-18 DIAGNOSIS — M25561 Pain in right knee: Secondary | ICD-10-CM | POA: Diagnosis not present

## 2023-12-22 DIAGNOSIS — M25562 Pain in left knee: Secondary | ICD-10-CM | POA: Diagnosis not present

## 2023-12-22 DIAGNOSIS — M25512 Pain in left shoulder: Secondary | ICD-10-CM | POA: Diagnosis not present

## 2023-12-22 DIAGNOSIS — E1136 Type 2 diabetes mellitus with diabetic cataract: Secondary | ICD-10-CM | POA: Diagnosis not present

## 2023-12-22 DIAGNOSIS — M25552 Pain in left hip: Secondary | ICD-10-CM | POA: Diagnosis not present

## 2023-12-22 DIAGNOSIS — M25551 Pain in right hip: Secondary | ICD-10-CM | POA: Diagnosis not present

## 2023-12-22 DIAGNOSIS — M419 Scoliosis, unspecified: Secondary | ICD-10-CM | POA: Diagnosis not present

## 2023-12-22 DIAGNOSIS — M25511 Pain in right shoulder: Secondary | ICD-10-CM | POA: Diagnosis not present

## 2023-12-22 DIAGNOSIS — G8929 Other chronic pain: Secondary | ICD-10-CM | POA: Diagnosis not present

## 2023-12-22 DIAGNOSIS — M25561 Pain in right knee: Secondary | ICD-10-CM | POA: Diagnosis not present

## 2023-12-29 DIAGNOSIS — M25562 Pain in left knee: Secondary | ICD-10-CM | POA: Diagnosis not present

## 2023-12-29 DIAGNOSIS — M25551 Pain in right hip: Secondary | ICD-10-CM | POA: Diagnosis not present

## 2023-12-29 DIAGNOSIS — M25561 Pain in right knee: Secondary | ICD-10-CM | POA: Diagnosis not present

## 2023-12-29 DIAGNOSIS — M25511 Pain in right shoulder: Secondary | ICD-10-CM | POA: Diagnosis not present

## 2023-12-29 DIAGNOSIS — M419 Scoliosis, unspecified: Secondary | ICD-10-CM | POA: Diagnosis not present

## 2023-12-29 DIAGNOSIS — G8929 Other chronic pain: Secondary | ICD-10-CM | POA: Diagnosis not present

## 2023-12-29 DIAGNOSIS — E1136 Type 2 diabetes mellitus with diabetic cataract: Secondary | ICD-10-CM | POA: Diagnosis not present

## 2023-12-29 DIAGNOSIS — M25512 Pain in left shoulder: Secondary | ICD-10-CM | POA: Diagnosis not present

## 2023-12-29 DIAGNOSIS — M25552 Pain in left hip: Secondary | ICD-10-CM | POA: Diagnosis not present

## 2024-01-04 DIAGNOSIS — M419 Scoliosis, unspecified: Secondary | ICD-10-CM | POA: Diagnosis not present

## 2024-01-04 DIAGNOSIS — M25512 Pain in left shoulder: Secondary | ICD-10-CM | POA: Diagnosis not present

## 2024-01-04 DIAGNOSIS — M25511 Pain in right shoulder: Secondary | ICD-10-CM | POA: Diagnosis not present

## 2024-01-04 DIAGNOSIS — G8929 Other chronic pain: Secondary | ICD-10-CM | POA: Diagnosis not present

## 2024-01-04 DIAGNOSIS — M25551 Pain in right hip: Secondary | ICD-10-CM | POA: Diagnosis not present

## 2024-01-04 DIAGNOSIS — M25561 Pain in right knee: Secondary | ICD-10-CM | POA: Diagnosis not present

## 2024-01-04 DIAGNOSIS — M25552 Pain in left hip: Secondary | ICD-10-CM | POA: Diagnosis not present

## 2024-01-04 DIAGNOSIS — E1136 Type 2 diabetes mellitus with diabetic cataract: Secondary | ICD-10-CM | POA: Diagnosis not present

## 2024-01-04 DIAGNOSIS — M25562 Pain in left knee: Secondary | ICD-10-CM | POA: Diagnosis not present

## 2024-01-11 DIAGNOSIS — M419 Scoliosis, unspecified: Secondary | ICD-10-CM | POA: Diagnosis not present

## 2024-01-11 DIAGNOSIS — M25551 Pain in right hip: Secondary | ICD-10-CM | POA: Diagnosis not present

## 2024-01-11 DIAGNOSIS — M25512 Pain in left shoulder: Secondary | ICD-10-CM | POA: Diagnosis not present

## 2024-01-11 DIAGNOSIS — G8929 Other chronic pain: Secondary | ICD-10-CM | POA: Diagnosis not present

## 2024-01-11 DIAGNOSIS — M25562 Pain in left knee: Secondary | ICD-10-CM | POA: Diagnosis not present

## 2024-01-11 DIAGNOSIS — E1136 Type 2 diabetes mellitus with diabetic cataract: Secondary | ICD-10-CM | POA: Diagnosis not present

## 2024-01-11 DIAGNOSIS — M25561 Pain in right knee: Secondary | ICD-10-CM | POA: Diagnosis not present

## 2024-01-11 DIAGNOSIS — M25511 Pain in right shoulder: Secondary | ICD-10-CM | POA: Diagnosis not present

## 2024-01-11 DIAGNOSIS — M25552 Pain in left hip: Secondary | ICD-10-CM | POA: Diagnosis not present

## 2024-02-15 ENCOUNTER — Telehealth: Payer: Self-pay | Admitting: Family Medicine

## 2024-02-15 NOTE — Telephone Encounter (Signed)
 Copied from CRM [id number]. Topic: General - Call Back - No Documentation >> Feb 15, 2024 11:12 AM Olam RAMAN wrote: Reason for CRM:  Pt stated she had something stuck in her ear, and heas gotten it out. now needs to know what she should do next. i aske dabout an appt, pt declined and stated she has an appt in nov. CB [phone]

## 2024-02-16 NOTE — Telephone Encounter (Signed)
 Lvm advising pt that since she was able to remove the object there is nothing that needs to be done unless she is experiencing pain or a loss of hearing.

## 2024-02-17 ENCOUNTER — Inpatient Hospital Stay: Admission: RE | Admit: 2024-02-17 | Source: Ambulatory Visit

## 2024-02-17 ENCOUNTER — Ambulatory Visit: Payer: Self-pay

## 2024-02-17 DIAGNOSIS — R918 Other nonspecific abnormal finding of lung field: Secondary | ICD-10-CM | POA: Diagnosis not present

## 2024-02-17 DIAGNOSIS — S161XXA Strain of muscle, fascia and tendon at neck level, initial encounter: Secondary | ICD-10-CM | POA: Diagnosis not present

## 2024-02-17 DIAGNOSIS — R079 Chest pain, unspecified: Secondary | ICD-10-CM | POA: Diagnosis not present

## 2024-02-17 DIAGNOSIS — W228XXA Striking against or struck by other objects, initial encounter: Secondary | ICD-10-CM | POA: Diagnosis not present

## 2024-02-17 DIAGNOSIS — M47812 Spondylosis without myelopathy or radiculopathy, cervical region: Secondary | ICD-10-CM | POA: Diagnosis not present

## 2024-02-17 NOTE — Telephone Encounter (Signed)
 FYI Only or Action Required?: FYI only for provider.: made pt appt with [name] today  Patient was last seen in primary care on 11/12/2023 by Shelby Dorothyann BIRCH, MD.  Called Nurse Triage reporting Headache.  Symptoms began several days ago.  Interventions attempted: Nothing.  Symptoms are: gradually worsening.  Triage Disposition: See HCP Within 4 Hours (Or PCP Triage)  Patient/caregiver understands and will follow disposition?: Yes  Copied from CRM [id number]. Topic: Clinical - Red Word Triage >> Feb 17, 2024  8:49 AM Shelby Thomas wrote: Red Word that prompted transfer to Nurse Triage: Unbearable headache/back of the neck pain for the last three days. Reason for Disposition  [1] SEVERE headache (e.g., excruciating) AND [2] not improved after 2 hours of pain medicine    Headache & ear irritation after having part of hearing aid removed from inside of ear by son with tweezers  Answer Assessment - Initial Assessment Questions 1. LOCATION: Where does it hurt?      back of left neck and on left side of back of the head.  2. ONSET: When did the headache start? (e.g., minutes, hours, days)      X 3 days 3. PATTERN: Does the pain come and go, or has it been constant since it started?     Constant when moving and pt has still to help the pain go down a little 4. SEVERITY: How bad is the pain? and What does it keep you from doing?  (e.g., Scale 1-10; mild, moderate, or severe)     severe 5. RECURRENT SYMPTOM: Have you ever had headaches before? If Yes, ask: When was the last time? and What happened that time?      no 6. CAUSE: What do you think is causing the headache?     unknown 7. MIGRAINE: Have you been diagnosed with migraine headaches? If Yes, ask: Is this headache similar?      na 8. HEAD INJURY: Has there been any recent injury to your head?      na 9. OTHER SYMPTOMS: Do you have any other symptoms? (e.g., fever, stiff neck, eye pain, sore  throat, cold symptoms)    Stiff neck & pain with movement - this started with the hearing aide incident 10. PREGNANCY: Is there any chance you are pregnant? When was your last menstrual period?       na  Started on right collarbone, neck & in back of neck and now back of left neck and on left side of back of the head.  Movement causes pain to increase.  Pt wears hearing  aids and small piece of hearing aide became lodged in ear for about 2 days and son removed it- since then patient has been having headache  Protocols used: Headache-A-AH

## 2024-03-03 ENCOUNTER — Other Ambulatory Visit: Payer: Self-pay | Admitting: Family Medicine

## 2024-03-03 DIAGNOSIS — E669 Obesity, unspecified: Secondary | ICD-10-CM

## 2024-03-14 ENCOUNTER — Encounter: Payer: Self-pay | Admitting: Family Medicine

## 2024-03-14 ENCOUNTER — Ambulatory Visit: Admitting: Family Medicine

## 2024-03-14 VITALS — BP 120/78 | HR 93 | Wt 177.0 lb

## 2024-03-14 DIAGNOSIS — E669 Obesity, unspecified: Secondary | ICD-10-CM | POA: Diagnosis not present

## 2024-03-14 DIAGNOSIS — G4734 Idiopathic sleep related nonobstructive alveolar hypoventilation: Secondary | ICD-10-CM | POA: Diagnosis not present

## 2024-03-14 DIAGNOSIS — Z7984 Long term (current) use of oral hypoglycemic drugs: Secondary | ICD-10-CM | POA: Diagnosis not present

## 2024-03-14 DIAGNOSIS — E1169 Type 2 diabetes mellitus with other specified complication: Secondary | ICD-10-CM | POA: Diagnosis not present

## 2024-03-14 DIAGNOSIS — Z23 Encounter for immunization: Secondary | ICD-10-CM

## 2024-03-14 LAB — POCT GLYCOSYLATED HEMOGLOBIN (HGB A1C): Hemoglobin A1C: 8 % — AB (ref 4.0–5.6)

## 2024-03-14 MED ORDER — AMBULATORY NON FORMULARY MEDICATION: 1 refills | Status: AC

## 2024-03-14 NOTE — Patient Instructions (Addendum)
 Recommend get your Shingles and Tetanus vaccine at the pharmacy   Please start back on your metformin .

## 2024-03-14 NOTE — Assessment & Plan Note (Addendum)
 A1c is up to 8.0 this time which is a significant jump from last time she says she is willing to restart the metformin  encouraged her to start taking it once a day for a week and then increase to twice a day.  Does check her sugars at home so encouraged her to do so at least 2-3 times a week.  Continue daily statin

## 2024-03-14 NOTE — Progress Notes (Signed)
 Established Patient Office Visit  Patient ID: Shelby Thomas, female    DOB: Sep 26, 1945  Age: 78 y.o. MRN: [id number] PCP: Alvan Dorothyann BIRCH, MD  Chief Complaint  Patient presents with   Diabetes   Hypertension    Subjective:     HPI   Shelby Thomas is here today with her best friend and is here for follow-up for her diabetes and high blood pressure  Hypertension-she is taking her medications regularly she gets them through the mail order and has not had any problems getting them.   They are more recently she says she actually has not been taking her metformin .  She did not have any side effects per se she just was not taking it but she has been getting it from the mail order pharmacy.  She also wanted to let me know that she did fall recently she slid on some water and landed on her buttocks she said she did not hurt herself.  She says that her device at home for her oxygen is no longer working she said she tried to reach out to the company and they said that she would have to have a new order but since not wearing it she says she really has not noticed a difference wearing it and not wearing it so thinks that she might not need it anymore.    ROS    Objective:     BP 120/78   Pulse 93   Wt 177 lb (80.3 kg)   SpO2 95%   BMI 30.38 kg/m    Physical Exam Vitals and nursing note reviewed.  Constitutional:      Appearance: Normal appearance.  HENT:     Head: Normocephalic and atraumatic.  Eyes:     Conjunctiva/sclera: Conjunctivae normal.  Cardiovascular:     Rate and Rhythm: Normal rate and regular rhythm.  Pulmonary:     Effort: Pulmonary effort is normal.     Breath sounds: Normal breath sounds.  Skin:    General: Skin is warm and dry.  Neurological:     Mental Status: She is alert.  Psychiatric:        Mood and Affect: Mood normal.      Results for orders placed or performed in visit on 03/14/24  POCT HgB A1C  Result Value Ref Range   Hemoglobin  A1C 8.0 (A) 4.0 - 5.6 %   HbA1c POC (<> result, manual entry)     HbA1c, POC (prediabetic range)     HbA1c, POC (controlled diabetic range)        The 10-year ASCVD risk score (Arnett DK, et al., 2019) is: 39.2%    Assessment & Plan:   Problem List Items Addressed This Visit       Respiratory   Nocturnal hypoxia   She does have documented history of hypoxemia.  She recently says that her machine broke and has not been wearing it and says she does not notice a difference in how she feels but I reassured her that we still need to evaluate for this she also has a prior history of sleep apnea but I think did not do well with the CPAP machine.  Will get an overnight pulse oximetry test to confirm.        Endocrine   Type 2 diabetes mellitus in patient with obesity (HCC) - Primary   A1c is up to 8.0 this time which is a significant jump from last time she says she is willing  to restart the metformin  encouraged her to start taking it once a day for a week and then increase to twice a day.  Does check her sugars at home so encouraged her to do so at least 2-3 times a week.  Continue daily statin       Relevant Medications   simvastatin  (ZOCOR ) 40 MG tablet   Other Relevant Orders   POCT HgB A1C (Completed)   Other Visit Diagnoses       Encounter for immunization       Relevant Orders   Flu vaccine HIGH DOSE PF(Fluzone Trivalent) (Completed)       Assessment and Plan Assessment & Plan     Return in about 3 months (around 06/14/2024) for Diabetes follow-up.    Dorothyann Byars, MD [facility]

## 2024-03-14 NOTE — Assessment & Plan Note (Signed)
 She does have documented history of hypoxemia.  She recently says that her machine broke and has not been wearing it and says she does not notice a difference in how she feels but I reassured her that we still need to evaluate for this she also has a prior history of sleep apnea but I think did not do well with the CPAP machine.  Will get an overnight pulse oximetry test to confirm.

## 2024-07-05 ENCOUNTER — Ambulatory Visit: Admitting: Family Medicine
